# Patient Record
Sex: Male | Born: 1960
Health system: Southern US, Community
[De-identification: ages and names within clinical notes are randomized; demographics above are authoritative.]

## PROBLEM LIST (undated history)

## (undated) DIAGNOSIS — E785 Hyperlipidemia, unspecified: Secondary | ICD-10-CM

## (undated) DIAGNOSIS — K429 Umbilical hernia without obstruction or gangrene: Secondary | ICD-10-CM

## (undated) DIAGNOSIS — K649 Unspecified hemorrhoids: Secondary | ICD-10-CM

## (undated) DIAGNOSIS — T7840XA Allergy, unspecified, initial encounter: Secondary | ICD-10-CM

## (undated) DIAGNOSIS — M1990 Unspecified osteoarthritis, unspecified site: Secondary | ICD-10-CM

## (undated) HISTORY — DX: Unspecified hemorrhoids: K64.9

## (undated) HISTORY — DX: Allergy, unspecified, initial encounter: T78.40XA

## (undated) HISTORY — PX: COLONOSCOPY: SHX174

## (undated) HISTORY — DX: Umbilical hernia without obstruction or gangrene: K42.9

## (undated) HISTORY — DX: Unspecified osteoarthritis, unspecified site: M19.90

## (undated) HISTORY — PX: OTHER SURGICAL HISTORY: SHX169

## (undated) HISTORY — PX: APPENDECTOMY: SHX54

## (undated) HISTORY — DX: Hyperlipidemia, unspecified: E78.5

---

## 2004-04-10 ENCOUNTER — Encounter: Payer: Self-pay | Admitting: Internal Medicine

## 2004-06-30 ENCOUNTER — Ambulatory Visit: Payer: Self-pay | Admitting: Internal Medicine

## 2004-07-22 ENCOUNTER — Ambulatory Visit: Payer: Self-pay | Admitting: Internal Medicine

## 2004-08-27 ENCOUNTER — Ambulatory Visit: Payer: Self-pay | Admitting: Internal Medicine

## 2004-10-09 ENCOUNTER — Ambulatory Visit: Payer: Self-pay | Admitting: Internal Medicine

## 2004-10-16 ENCOUNTER — Ambulatory Visit: Payer: Self-pay | Admitting: Internal Medicine

## 2004-12-05 ENCOUNTER — Ambulatory Visit: Payer: Self-pay | Admitting: Internal Medicine

## 2005-01-16 ENCOUNTER — Ambulatory Visit: Payer: Self-pay | Admitting: Internal Medicine

## 2005-02-04 ENCOUNTER — Ambulatory Visit: Payer: Self-pay | Admitting: Internal Medicine

## 2005-03-06 ENCOUNTER — Ambulatory Visit: Payer: Self-pay | Admitting: Internal Medicine

## 2005-08-07 ENCOUNTER — Ambulatory Visit: Payer: Self-pay | Admitting: Internal Medicine

## 2005-08-14 ENCOUNTER — Ambulatory Visit: Payer: Self-pay | Admitting: Internal Medicine

## 2005-11-04 ENCOUNTER — Ambulatory Visit: Payer: Self-pay | Admitting: Internal Medicine

## 2005-12-03 ENCOUNTER — Ambulatory Visit: Payer: Self-pay | Admitting: Internal Medicine

## 2006-02-26 ENCOUNTER — Encounter: Admission: RE | Admit: 2006-02-26 | Discharge: 2006-02-26 | Payer: Self-pay | Admitting: Dermatology

## 2006-03-04 ENCOUNTER — Ambulatory Visit: Payer: Self-pay | Admitting: Internal Medicine

## 2006-03-11 ENCOUNTER — Ambulatory Visit: Payer: Self-pay | Admitting: Internal Medicine

## 2006-05-01 ENCOUNTER — Encounter: Admission: RE | Admit: 2006-05-01 | Discharge: 2006-05-01 | Payer: Self-pay | Admitting: Family Medicine

## 2006-07-28 ENCOUNTER — Ambulatory Visit: Payer: Self-pay | Admitting: Internal Medicine

## 2006-08-05 ENCOUNTER — Ambulatory Visit: Payer: Self-pay | Admitting: Gastroenterology

## 2006-08-26 ENCOUNTER — Ambulatory Visit: Payer: Self-pay | Admitting: Gastroenterology

## 2007-05-30 ENCOUNTER — Ambulatory Visit: Payer: Self-pay | Admitting: Internal Medicine

## 2007-05-30 DIAGNOSIS — A6000 Herpesviral infection of urogenital system, unspecified: Secondary | ICD-10-CM | POA: Insufficient documentation

## 2007-05-30 DIAGNOSIS — L52 Erythema nodosum: Secondary | ICD-10-CM | POA: Insufficient documentation

## 2007-05-30 DIAGNOSIS — J45909 Unspecified asthma, uncomplicated: Secondary | ICD-10-CM | POA: Insufficient documentation

## 2007-05-30 DIAGNOSIS — E781 Pure hyperglyceridemia: Secondary | ICD-10-CM | POA: Insufficient documentation

## 2007-05-30 DIAGNOSIS — J309 Allergic rhinitis, unspecified: Secondary | ICD-10-CM | POA: Insufficient documentation

## 2007-05-30 LAB — CONVERTED CEMR LAB
Direct LDL: 64.9 mg/dL
Total CHOL/HDL Ratio: 5.3
Triglycerides: 358 mg/dL (ref 0–149)
VLDL: 72 mg/dL — ABNORMAL HIGH (ref 0–40)

## 2007-07-29 IMAGING — US US ABDOMEN COMPLETE
1 series · 13 of 25 positions shown · non-contrast
Comparison: none

ACCESSION NUMBER:  [id number]

 READING PHYSICIAN:  Okamoto, Eliza.
 PROCEDURE:  Multiplanar abdominal ultrasound imaging was performed in the upright, supine, right and left lateral decubitus positions.
 RESULTS:  Abdominal aorta normal diameter at 1.8 cm.  The IVC is patent. 
 The pancreas appears normal throughout the head, body and tail without evidence of ductal dilatation, pancreatic masses, or peripancreatic inflammation.  
 Gallbladder is well distended, thin walled, with no pericholecystic fluid or intraluminal echogenic foci to suggest gallstone disease.  Wall thickness 2.9 mm. 
 The common bile duct measures 3.1 mm in maximal diameter without evidence of intraluminal foci.
 The liver appears normal without evidence of parenchymal lesion, ductal dilatation or vascular abnormality.
 Kidneys are normal in appearance.  Right 8.3, left 11.1 cm.  There is a 26 x 21 x 23 mm cyst in the lower pole of the left kidney. 
 Spleen is normal in size, measuring 11.7 cm without parenchymal lesion.

[Series 1: us abdomen complete · 0.25mm/px · 13 of 53 slices shown]
[im 1/53]
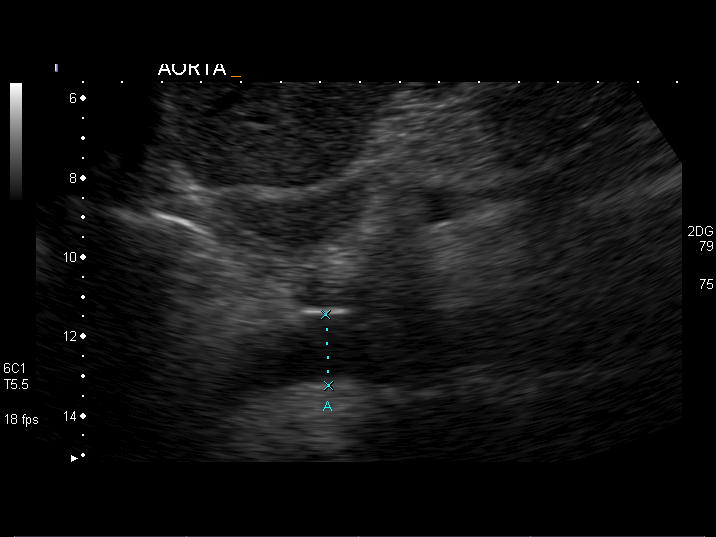
[im 5/53]
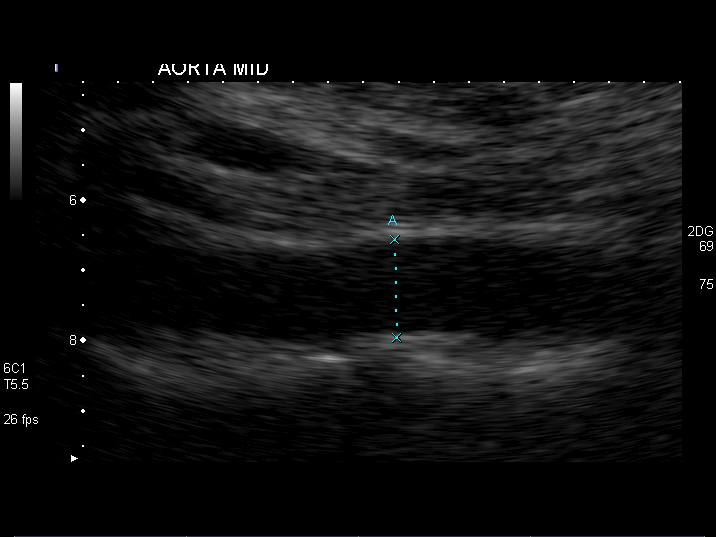
[im 9/53]
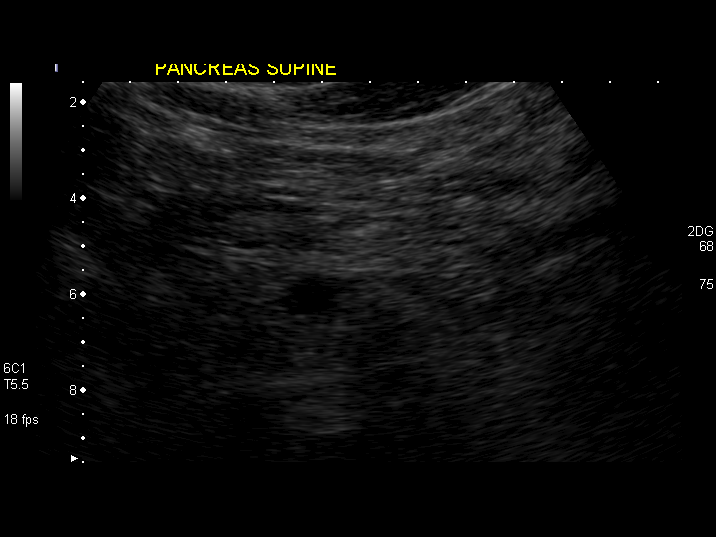
[im 14/53]
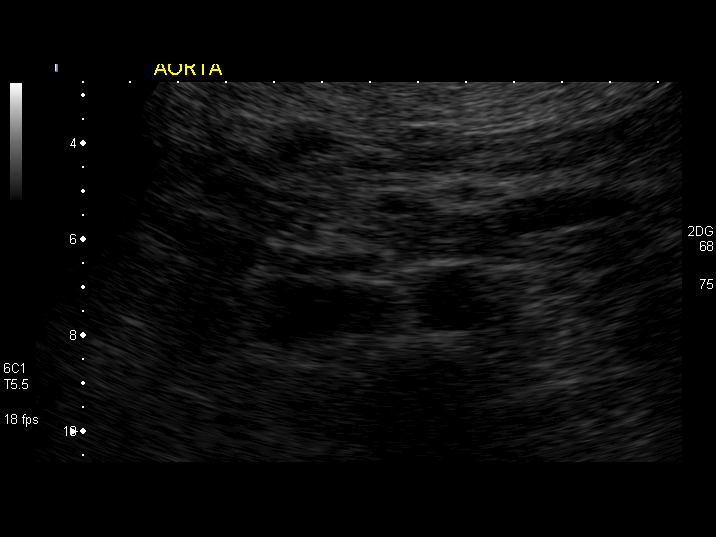
[im 18/53]
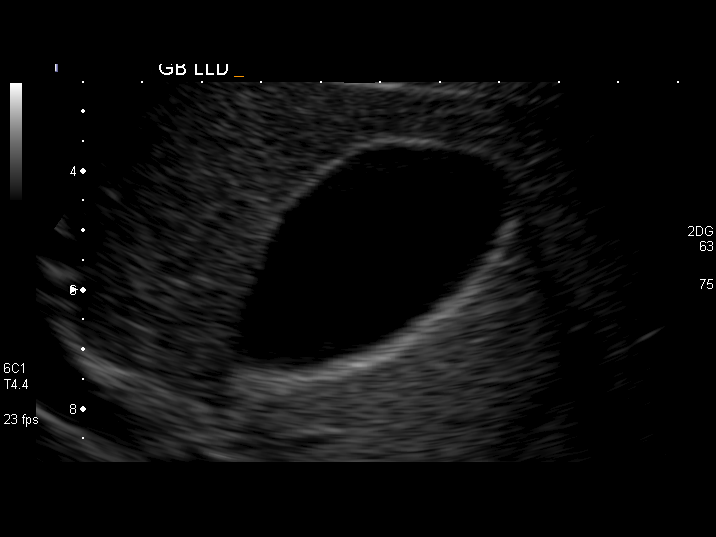
[im 22/53]
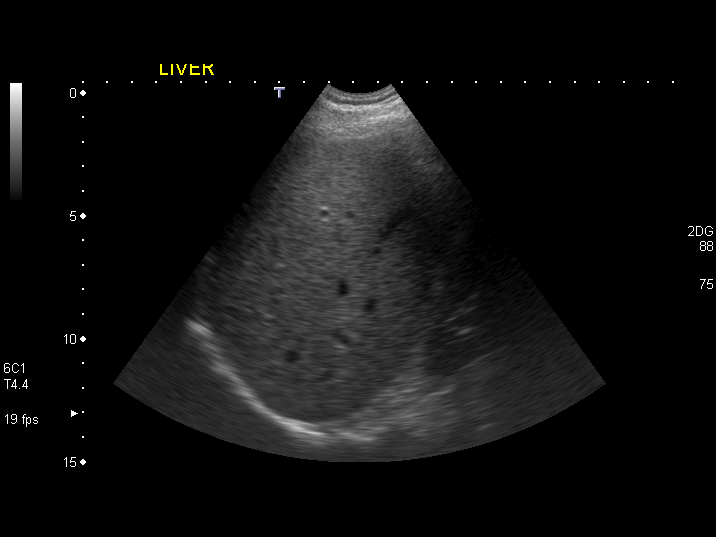
[im 27/53]
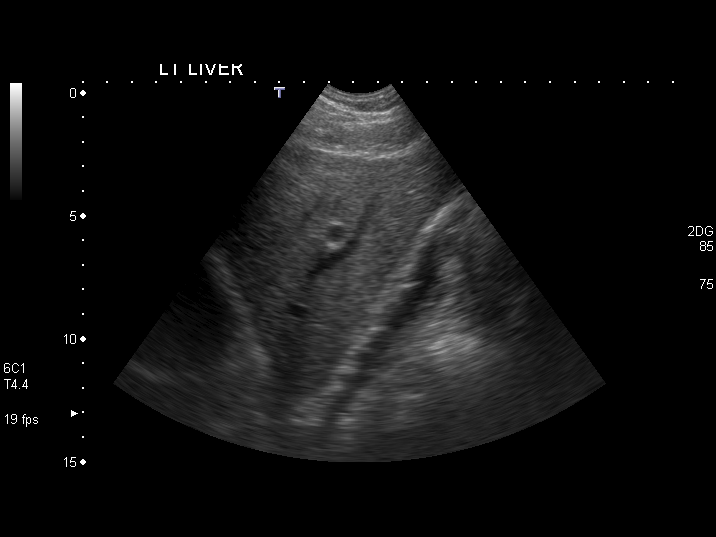
[im 31/53]
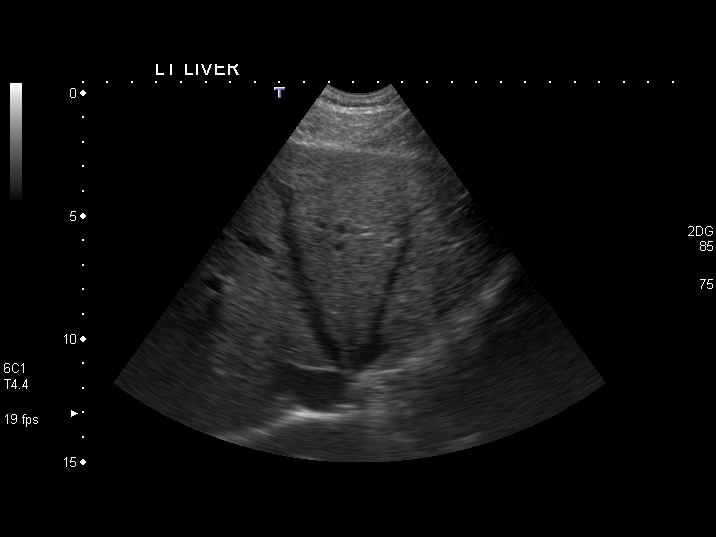
[im 35/53]
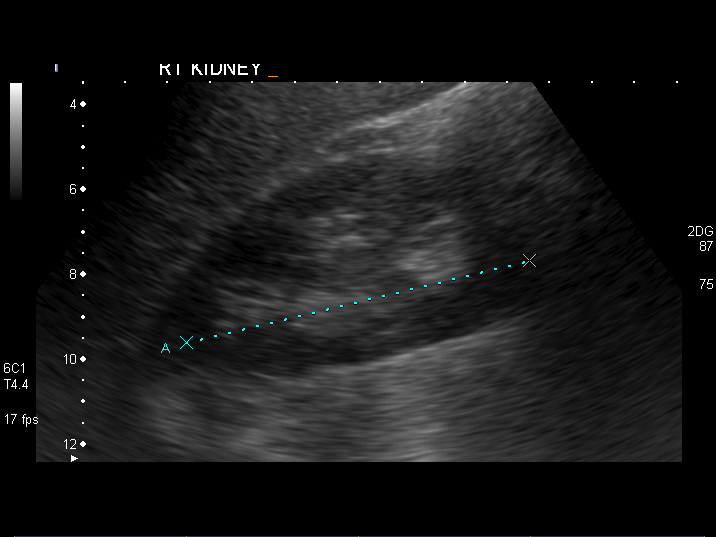
[im 40/53]
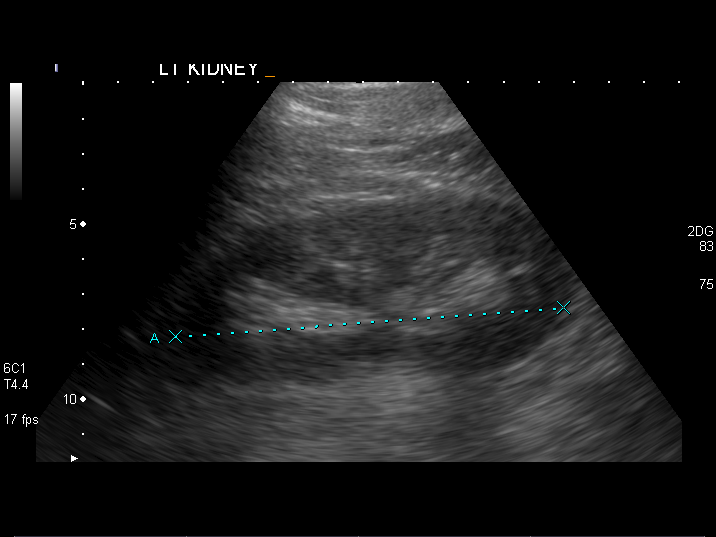
[im 44/53]
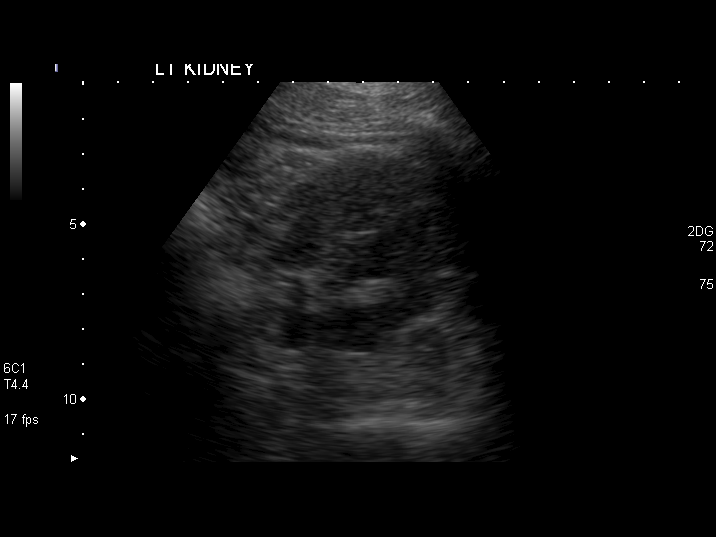
[im 48/53]
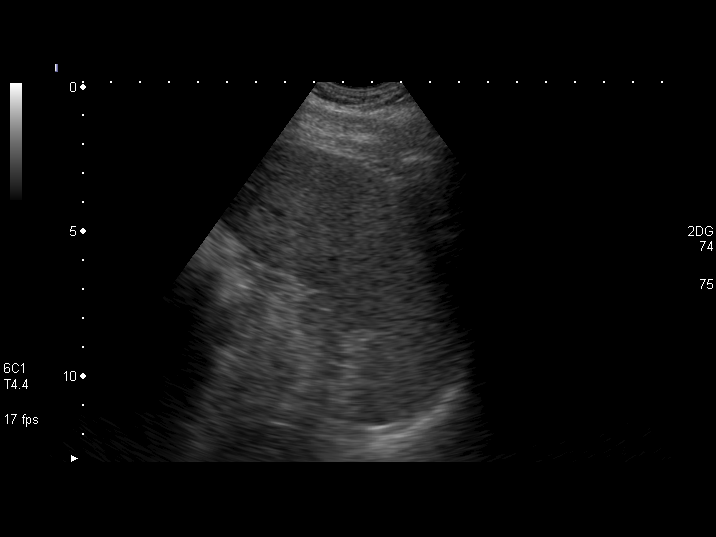
[im 53/53]
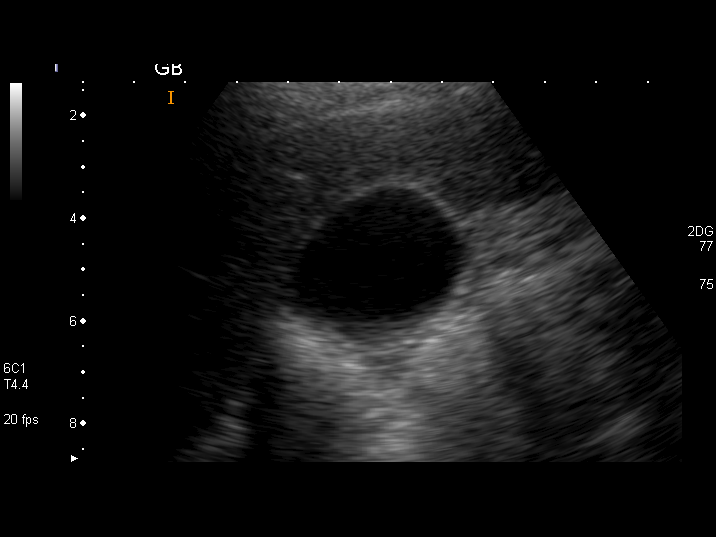

[13 of 25 positions shown; findings below may reference images not displayed]

IMPRESSION: This ultrasound exam shows no evidence of cholelithiasis or biliary ductal dilatation. The pancreas and liver are well imaged and appeared normal. There is an incidental simple left renal cyst noted.

## 2007-08-17 ENCOUNTER — Encounter: Admission: RE | Admit: 2007-08-17 | Discharge: 2007-08-17 | Payer: Self-pay | Admitting: Family Medicine

## 2008-04-16 ENCOUNTER — Ambulatory Visit: Payer: Self-pay | Admitting: Internal Medicine

## 2008-04-16 DIAGNOSIS — M159 Polyosteoarthritis, unspecified: Secondary | ICD-10-CM | POA: Insufficient documentation

## 2008-04-16 LAB — CONVERTED CEMR LAB: Uric Acid, Serum: 3.7 mg/dL — ABNORMAL LOW (ref 4.0–7.8)

## 2008-05-03 ENCOUNTER — Telehealth: Payer: Self-pay | Admitting: Internal Medicine

## 2008-07-13 LAB — FECAL OCCULT BLOOD, GUAIAC: Fecal Occult Blood: NEGATIVE

## 2008-08-17 ENCOUNTER — Ambulatory Visit: Payer: Self-pay | Admitting: Internal Medicine

## 2008-08-17 LAB — CONVERTED CEMR LAB
AST: 28 units/L (ref 0–37)
Albumin: 4.2 g/dL (ref 3.5–5.2)
Alkaline Phosphatase: 60 units/L (ref 39–117)
BUN: 11 mg/dL (ref 6–23)
Basophils Relative: 0.3 % (ref 0.0–3.0)
Chloride: 104 meq/L (ref 96–112)
Creatinine, Ser: 1 mg/dL (ref 0.4–1.5)
Direct LDL: 80.1 mg/dL
Eosinophils Absolute: 0.1 10*3/uL (ref 0.0–0.7)
Eosinophils Relative: 2.6 % (ref 0.0–5.0)
GFR calc non Af Amer: 86 mL/min
Glucose, Bld: 82 mg/dL (ref 70–99)
HCT: 43.8 % (ref 39.0–52.0)
MCV: 90.5 fL (ref 78.0–100.0)
Monocytes Absolute: 0.5 10*3/uL (ref 0.1–1.0)
Monocytes Relative: 10.3 % (ref 3.0–12.0)
Neutrophils Relative %: 64.5 % (ref 43.0–77.0)
Nitrite: NEGATIVE
RBC: 4.84 M/uL (ref 4.22–5.81)
Specific Gravity, Urine: 1.015
Total CHOL/HDL Ratio: 5.9
Total Protein: 6.5 g/dL (ref 6.0–8.3)
WBC Urine, dipstick: NEGATIVE
WBC: 4.4 10*3/uL — ABNORMAL LOW (ref 4.5–10.5)

## 2008-08-28 ENCOUNTER — Ambulatory Visit: Payer: Self-pay | Admitting: Internal Medicine

## 2008-11-20 ENCOUNTER — Ambulatory Visit: Payer: Self-pay | Admitting: Internal Medicine

## 2008-11-20 LAB — CONVERTED CEMR LAB
ALT: 49 units/L (ref 0–53)
AST: 32 units/L (ref 0–37)
BUN: 12 mg/dL (ref 6–23)
Basophils Relative: 0.4 % (ref 0.0–3.0)
Bilirubin Urine: NEGATIVE
Bilirubin, Direct: 0.1 mg/dL (ref 0.0–0.3)
Chloride: 107 meq/L (ref 96–112)
Eosinophils Relative: 3.3 % (ref 0.0–5.0)
GFR calc non Af Amer: 69.27 mL/min (ref >60)
HCT: 43.7 % (ref 39.0–52.0)
LDL Cholesterol: 108 mg/dL — ABNORMAL HIGH (ref 0–99)
Lymphs Abs: 1 10*3/uL (ref 0.7–4.0)
MCV: 91.2 fL (ref 78.0–100.0)
Monocytes Absolute: 0.4 10*3/uL (ref 0.1–1.0)
Monocytes Relative: 10.5 % (ref 3.0–12.0)
Nitrite: NEGATIVE
Platelets: 166 10*3/uL (ref 150.0–400.0)
Potassium: 4.1 meq/L (ref 3.5–5.1)
Protein, U semiquant: NEGATIVE
RBC: 4.79 M/uL (ref 4.22–5.81)
Sodium: 142 meq/L (ref 135–145)
TSH: 1.85 microintl units/mL (ref 0.35–5.50)
Total Bilirubin: 1 mg/dL (ref 0.3–1.2)
Total CHOL/HDL Ratio: 5
Total Protein: 6.4 g/dL (ref 6.0–8.3)
Urobilinogen, UA: 0.2
VLDL: 29 mg/dL (ref 0.0–40.0)
WBC Urine, dipstick: NEGATIVE
WBC: 3.7 10*3/uL — ABNORMAL LOW (ref 4.5–10.5)

## 2008-11-27 ENCOUNTER — Ambulatory Visit: Payer: Self-pay | Admitting: Internal Medicine

## 2009-04-17 ENCOUNTER — Ambulatory Visit: Payer: Self-pay | Admitting: Internal Medicine

## 2009-04-17 LAB — CONVERTED CEMR LAB
AST: 25 units/L (ref 0–37)
Albumin: 4.1 g/dL (ref 3.5–5.2)
Alkaline Phosphatase: 62 units/L (ref 39–117)
Cholesterol: 168 mg/dL (ref 0–200)
Total Protein: 6.4 g/dL (ref 6.0–8.3)

## 2009-04-24 ENCOUNTER — Ambulatory Visit: Payer: Self-pay | Admitting: Internal Medicine

## 2009-04-24 DIAGNOSIS — N529 Male erectile dysfunction, unspecified: Secondary | ICD-10-CM | POA: Insufficient documentation

## 2009-04-29 LAB — CONVERTED CEMR LAB: Testosterone: 298.91 ng/dL — ABNORMAL LOW (ref 350.00–890.00)

## 2009-04-30 ENCOUNTER — Telehealth (INDEPENDENT_AMBULATORY_CARE_PROVIDER_SITE_OTHER): Payer: Self-pay | Admitting: *Deleted

## 2009-05-07 ENCOUNTER — Telehealth: Payer: Self-pay | Admitting: Internal Medicine

## 2009-05-07 ENCOUNTER — Ambulatory Visit: Payer: Self-pay | Admitting: Internal Medicine

## 2009-06-20 ENCOUNTER — Ambulatory Visit: Payer: Self-pay | Admitting: Internal Medicine

## 2009-06-20 LAB — CONVERTED CEMR LAB
Albumin: 4.2 g/dL (ref 3.5–5.2)
Cholesterol: 153 mg/dL (ref 0–200)
Direct LDL: 78.4 mg/dL
HDL: 31.8 mg/dL — ABNORMAL LOW (ref >39.00)
Testosterone: 357.95 ng/dL (ref 350.00–890.00)
Total CHOL/HDL Ratio: 5
Total Protein: 6.1 g/dL (ref 6.0–8.3)
VLDL: 49.8 mg/dL — ABNORMAL HIGH (ref 0.0–40.0)

## 2009-06-25 ENCOUNTER — Ambulatory Visit: Payer: Self-pay | Admitting: Internal Medicine

## 2009-07-16 ENCOUNTER — Ambulatory Visit: Payer: Self-pay | Admitting: Internal Medicine

## 2009-07-29 DIAGNOSIS — E291 Testicular hypofunction: Secondary | ICD-10-CM | POA: Insufficient documentation

## 2009-07-30 ENCOUNTER — Ambulatory Visit: Payer: Self-pay | Admitting: Internal Medicine

## 2009-08-15 ENCOUNTER — Ambulatory Visit: Payer: Self-pay | Admitting: Internal Medicine

## 2009-08-15 ENCOUNTER — Telehealth: Payer: Self-pay | Admitting: Internal Medicine

## 2009-08-30 ENCOUNTER — Ambulatory Visit: Payer: Self-pay | Admitting: Internal Medicine

## 2009-09-19 ENCOUNTER — Ambulatory Visit: Payer: Self-pay | Admitting: Internal Medicine

## 2009-09-19 LAB — CONVERTED CEMR LAB: Testosterone: 157.96 ng/dL — ABNORMAL LOW (ref 350.00–890.00)

## 2009-09-30 ENCOUNTER — Ambulatory Visit: Payer: Self-pay | Admitting: Internal Medicine

## 2009-11-27 ENCOUNTER — Telehealth: Payer: Self-pay | Admitting: Internal Medicine

## 2009-11-29 ENCOUNTER — Ambulatory Visit: Payer: Self-pay | Admitting: Internal Medicine

## 2009-11-29 DIAGNOSIS — M549 Dorsalgia, unspecified: Secondary | ICD-10-CM | POA: Insufficient documentation

## 2009-12-09 ENCOUNTER — Ambulatory Visit: Payer: Self-pay | Admitting: Internal Medicine

## 2009-12-16 ENCOUNTER — Ambulatory Visit: Payer: Self-pay | Admitting: Internal Medicine

## 2009-12-16 DIAGNOSIS — M67911 Unspecified disorder of synovium and tendon, right shoulder: Secondary | ICD-10-CM | POA: Insufficient documentation

## 2010-02-11 ENCOUNTER — Ambulatory Visit: Payer: Self-pay | Admitting: Internal Medicine

## 2010-02-11 LAB — CONVERTED CEMR LAB
Bilirubin, Direct: 0.2 mg/dL (ref 0.0–0.3)
Direct LDL: 59.5 mg/dL
HDL: 34.3 mg/dL — ABNORMAL LOW (ref >39.00)
Total Bilirubin: 1.8 mg/dL — ABNORMAL HIGH (ref 0.3–1.2)
VLDL: 79 mg/dL — ABNORMAL HIGH (ref 0.0–40.0)

## 2010-02-17 ENCOUNTER — Ambulatory Visit: Payer: Self-pay | Admitting: Internal Medicine

## 2010-03-12 ENCOUNTER — Encounter: Payer: Self-pay | Admitting: Internal Medicine

## 2010-03-19 ENCOUNTER — Encounter: Payer: Self-pay | Admitting: Internal Medicine

## 2010-04-14 ENCOUNTER — Ambulatory Visit: Payer: Self-pay | Admitting: Internal Medicine

## 2010-04-14 LAB — CONVERTED CEMR LAB
Cholesterol: 168 mg/dL (ref 0–200)
HDL: 31.7 mg/dL — ABNORMAL LOW (ref >39.00)
VLDL: 73 mg/dL — ABNORMAL HIGH (ref 0.0–40.0)

## 2010-04-16 ENCOUNTER — Encounter: Payer: Self-pay | Admitting: Internal Medicine

## 2010-04-21 ENCOUNTER — Ambulatory Visit: Payer: Self-pay | Admitting: Internal Medicine

## 2010-05-28 ENCOUNTER — Telehealth: Payer: Self-pay | Admitting: Internal Medicine

## 2010-06-09 ENCOUNTER — Ambulatory Visit: Payer: Self-pay | Admitting: Internal Medicine

## 2010-06-16 ENCOUNTER — Ambulatory Visit: Payer: Self-pay | Admitting: Internal Medicine

## 2010-08-11 ENCOUNTER — Other Ambulatory Visit: Payer: Self-pay | Admitting: Internal Medicine

## 2010-08-11 ENCOUNTER — Ambulatory Visit
Admission: RE | Admit: 2010-08-11 | Discharge: 2010-08-11 | Payer: Self-pay | Source: Home / Self Care | Attending: Internal Medicine | Admitting: Internal Medicine

## 2010-08-11 LAB — HEPATIC FUNCTION PANEL
ALT: 33 U/L (ref 0–53)
AST: 22 U/L (ref 0–37)
Albumin: 4.2 g/dL (ref 3.5–5.2)
Alkaline Phosphatase: 61 U/L (ref 39–117)
Bilirubin, Direct: 0.2 mg/dL (ref 0.0–0.3)
Total Bilirubin: 2 mg/dL — ABNORMAL HIGH (ref 0.3–1.2)
Total Protein: 6.5 g/dL (ref 6.0–8.3)

## 2010-08-11 LAB — LIPID PANEL
Cholesterol: 161 mg/dL (ref 0–200)
HDL: 33 mg/dL — ABNORMAL LOW (ref >39.00)
Total CHOL/HDL Ratio: 5
Triglycerides: 256 mg/dL — ABNORMAL HIGH (ref 0.0–149.0)
VLDL: 51.2 mg/dL — ABNORMAL HIGH (ref 0.0–40.0)

## 2010-08-11 LAB — LDL CHOLESTEROL, DIRECT: Direct LDL: 87.4 mg/dL

## 2010-08-11 LAB — TESTOSTERONE: Testosterone: 539.16 ng/dL (ref 350.00–890.00)

## 2010-08-19 ENCOUNTER — Ambulatory Visit
Admission: RE | Admit: 2010-08-19 | Discharge: 2010-08-19 | Payer: Self-pay | Source: Home / Self Care | Attending: Internal Medicine | Admitting: Internal Medicine

## 2010-08-19 DIAGNOSIS — Z8719 Personal history of other diseases of the digestive system: Secondary | ICD-10-CM | POA: Insufficient documentation

## 2010-08-19 DIAGNOSIS — Z9889 Other specified postprocedural states: Secondary | ICD-10-CM

## 2010-09-02 NOTE — Progress Notes (Signed)
Summary: doc Clova Morlock please call  Phone Note Call from Patient Call back at Home Phone [phone]   Caller: Patient Call For: Stacie Glaze MD Reason for Call: Talk to Nurse Summary of Call: Pt would like dr Lovell Sheehan to call him personally concerning testosterone inj Initial call taken by: Heron Sabins,  August 15, 2009 10:36 AM  Follow-up for Phone Call        OV GIVEN Follow-up by: Willy Eddy, LPN,  August 15, 2009 11:08 AM

## 2010-09-02 NOTE — Assessment & Plan Note (Signed)
Summary: WANTS CORTISONE SHOT//CCM   Vital Signs:  Patient profile:   50 year old male Height:      71 inches Weight:      176 pounds BMI:     24.64 Temp:     98.2 degrees F oral Pulse rate:   64 / minute Resp:     14 per minute BP sitting:   110 / 70  (left arm)  Vitals Entered By: Willy Eddy, LPN (November 29, 2009 12:08 PM) CC: c/o rt s [place] pain-  no xray   CC:  c/o rt s [place] pain-  no xray.  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 Once Daily At Times 2)  Multivitamins  Caps (Multiple Vitamin) .Marland Kitchen.. 1 Once Daily 3)  Krill Oil 1000 Mg Caps (Krill Oil) .... Two  Two Times A Day 4)  Livalo 4 Mg Tabs (Pitavastatin Calcium) .... One By Mouth Weekly On Sunday 5)  Testosterone Cypionate 200 Mg/ml Oil (Testosterone Cypionate) .Marland Kitchen.. 1ml  Every Month 6)  Valacyclovir Hcl 500 Mg Tabs (Valacyclovir Hcl) .... One By Mouth Three Times A Day As Directed  Allergies (verified): No Known Drug Allergies  Physical Exam  Msk:  tender ober the insertion of the erectus longus Pulses:  R and L carotid,radial,femoral,dorsalis pedis and posterior tibial pulses are full and equal bilaterally Extremities:  No clubbing, cyanosis, edema, or deformity noted with normal full range of motion of all joints.   Neurologic:  No cranial nerve deficits  noted. Station and gait are normal. Plantar reflexes are down-going bilaterally. DTRs are symmetrical throughout. Sensory, motor and coordinative functions appear intact.   Impression & Recommendations:  Problem # 1:  BACK PAIN (ICD-724.5)  point injection in the mid back His updated medication list for this problem includes:    Bayer Aspirin 325 Mg Tabs (Aspirin) ..... Once daily at times  Discussed use of moist heat or ice, modified activities, medications, and stretching/strengthening exercises. Back care instructions given. To be seen in 2 weeks if no improvement; sooner if worsening of symptoms.   Orders: No Charge Patient Arrived (NCPA0) (NCPA0) Trigger Point Injection (1 or 2 muscles) (81191) Depo- Medrol 40mg  (J1030)  Complete Medication List: 1)  Bayer Aspirin 325 Mg Tabs (Aspirin) .... Once daily at times 2)  Multivitamins Caps (Multiple vitamin) .Marland Kitchen.. 1 once daily 3)  Krill Oil 1000 Mg Caps (Krill oil) .... Two  two times a day 4)  Livalo 4 Mg Tabs (Pitavastatin calcium) .... One by mouth weekly on sunday 5)  Testosterone Cypionate 200 Mg/ml Oil (Testosterone cypionate) .Marland Kitchen.. 1ml  every month 6)  Valacyclovir Hcl 500 Mg Tabs (Valacyclovir hcl) .... One by mouth three times a day as directed

## 2010-09-02 NOTE — Assessment & Plan Note (Signed)
Summary: 2 month rov/njr   Vital Signs:  Patient profile:   50 year old male Height:      71 inches Weight:      178 pounds BMI:     24.92 Temp:     98.2 degrees F oral Pulse rate:   60 / minute Resp:     14 per minute BP sitting:   110 / 70  (left arm)  Vitals Entered By: Willy Eddy, LPN (February 17, 2010 11:50 AM) CC: roas labs   CC:  roas labs.  History of Present Illness: monitering of testosterone therapy for hypogonadism has been on multiple dosing scheduled last has been weekly shots has hx of hyperlipidemia new complain of subacute injury to his foot with pain and swelling on the dorsum of the foot below the maleolus.  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) 9)  Herpes, Genital Nos  (ICD-054.10) 10)  Allergic Rhinitis  (ICD-477.9) 11)  Hyperlipidemia  (ICD-272.4) 12)  Asthma  (ICD-493.90)  Current Problems (verified): 1)  Shoulder Impingement Syndrome, Right  (ICD-726.2) 2)  Back Pain  (ICD-724.5) 3)  Testicular Hypofunction  (ICD-257.2) 4)  Erectile Dysfunction, Organic  (ICD-607.84) 5)  Preventive Health Care  (ICD-V70.0) 6)  Gouty Arthropathy  (ICD-274.0) 7)  Arthritis, Right Foot  (ICD-716.97) 8)  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 Once Daily At Times 2)  Multivitamins  Caps (Multiple Vitamin) .Marland Kitchen.. 1 Once Daily 3)  Krill Oil 1000 Mg Caps (Krill Oil)  .... Two  Two Times A Day 4)  Livalo 4 Mg Tabs (Pitavastatin Calcium) .... One By Mouth Weekly On Sunday 5)  Testosterone Cypionate 200 Mg/ml Oil (Testosterone Cypionate) .... .4 Ml Every Week 6)  Valacyclovir Hcl 500 Mg Tabs (Valacyclovir Hcl) .... One By Mouth Three Times A Day As Directed 7)  Fluconazole 100 Mg Tabs (Fluconazole) .... One By Mouth Daily For 10  Current Medications (verified): 1)  Bayer Aspirin 325 Mg  Tabs (Aspirin) .... Once Daily At Times 2)  Multivitamins  Caps (Multiple Vitamin) .Marland Kitchen.. 1 Once Daily 3)  Krill Oil 1000 Mg Caps (Krill Oil) .... Two  Two Times A Day 4)  Livalo 4 Mg Tabs (Pitavastatin Calcium) .... One By Mouth Weekly On Sunday 5)  Testosterone Cypionate 200 Mg/ml Oil (Testosterone Cypionate) .... .5 Ml Every Week 6)  Valacyclovir Hcl 500 Mg Tabs (Valacyclovir Hcl) .... One By Mouth Three Times A Day As Directed 7)  Fluconazole 100 Mg Tabs (Fluconazole) .... One By Mouth Daily For 10  Allergies (verified): No Known Drug Allergies  Past History:  Family History: Last updated: 2008-09-06 father died of brain tumor at 73, uncle died with "bone cancer" two aunts with breast cancer mother Family History Hypertension  Social History: Last updated: 05/30/2007 Single Never Smoked Alcohol use-no Drug use-no Regular exercise-yes Domestic Partner  Risk Factors: Exercise: yes (05/30/2007)  Risk Factors: Smoking Status: never (02/17/2010)  Past medical, surgical, family and social histories (including risk factors) reviewed, and no changes noted (except as noted below).  Past Medical History: Reviewed history from 05/30/2007 and no changes required. Asthma Hyperlipidemia Allergic rhinitis  Family History: Reviewed history from 08/28/2008 and no changes required. father died of brain tumor at 8, uncle died with "bone cancer" two aunts with breast cancer mother Family History Hypertension  Social History: Reviewed history from 05/30/2007  and no changes required. Single Never Smoked Alcohol use-no Drug use-no Regular exercise-yes Domestic Partner  Review of Systems  The patient denies anorexia, fever, weight loss, weight gain, vision loss, decreased hearing, hoarseness, chest pain, syncope, dyspnea on exertion, peripheral edema, prolonged cough, headaches, hemoptysis, abdominal pain, melena, hematochezia, severe indigestion/heartburn, hematuria, incontinence, genital sores, muscle weakness, suspicious skin lesions, transient blindness, difficulty walking, depression, unusual weight change, abnormal bleeding, enlarged lymph nodes, angioedema, breast masses, and testicular masses.    Physical Exam  General:  Well-developed,well-nourished,in no acute distress; alert,appropriate and cooperative throughout examination Head:  normocephalic and male-pattern balding.   Eyes:  pupils equal and pupils reactive to light.   Ears:  R ear normal and L ear normal.   Nose:  External nasal examination shows no deformity or inflammation. Nasal mucosa are pink and moist without lesions or exudates. Neck:  No deformities, masses, or tenderness noted. Lungs:  Normal respiratory effort, chest expands symmetrically. Lungs are clear to auscultation, no crackles or wheezes. Heart:  Normal rate and regular rhythm. S1 and S2 normal without gallop, murmur, click, rub or other extra sounds. Abdomen:  Bowel sounds positive,abdomen soft and non-tender without masses, organomegaly or hernias noted. Msk:  no joint warmth, no redness over joints, and joint tenderness.   Pulses:  R and L carotid,radial,femoral,dorsalis pedis and posterior tibial pulses are full and equal bilaterally Extremities:  trace right pedal edema.   Neurologic:  alert & oriented X3 and finger-to-nose normal.     Impression & Recommendations:  Problem # 1:  ARTHRITIS, RIGHT FOOT (ICD-716.97) did not refer due to controlled pain Elevate extremity; warm compresses, symptomatic  relief and medication as directed.   Problem # 2:  HYPERLIPIDEMIA (ICD-272.4)  His updated medication list for this problem includes:    Livalo 4 Mg Tabs (Pitavastatin calcium) ..... One by mouth weekly on sunday  Labs Reviewed: SGOT: 25 (02/11/2010)   SGPT: 35 (02/11/2010)  Lipid Goals: Chol Goal: 200 (05/30/2007)   HDL Goal: 40 (05/30/2007)   LDL Goal: 160 (05/30/2007)   TG Goal: 150 (05/30/2007)  Prior 10 Yr Risk Heart Disease: Not enough information (05/30/2007)   HDL:34.30 (02/11/2010), 31.80 (06/20/2009)  LDL:108 (11/20/2008), DEL (08/17/2008)  Chol:151 (02/11/2010), 153 (06/20/2009)  Trig:395.0 (02/11/2010), 269.0 (12/09/2009)  Problem # 3:  SHOULDER IMPINGEMENT SYNDROME, RIGHT (ICD-726.2) dr Rennis Chris is seeing pt for "bursitis"  Complete Medication List: 1)  Bayer Aspirin 325 Mg Tabs (Aspirin) .... Once daily at times 2)  Multivitamins Caps (Multiple vitamin) .Marland Kitchen.. 1 once daily 3)  Krill Oil 1000 Mg Caps (Krill oil) .... Two  two times a day 4)  Livalo 4 Mg Tabs (Pitavastatin calcium) .... One by mouth weekly on sunday 5)  Testosterone Cypionate 200 Mg/ml Oil (Testosterone cypionate) .... Marland Kitchen5 ml every week 6)  Valacyclovir Hcl 500 Mg Tabs (Valacyclovir hcl) .... One by mouth three times a day as directed 7)  Fluconazole 100 Mg Tabs (Fluconazole) .... One by mouth daily for 10  Patient Instructions: 1)  Please schedule a follow-up appointment in 2 months. 2)  testosterone level 607.84

## 2010-09-02 NOTE — Assessment & Plan Note (Signed)
Summary: 11 WEEK FUP//CCM   Vital Signs:  Patient profile:   50 year old male Height:      71 inches Weight:      174 pounds BMI:     24.36 Temp:     98.2 degrees F oral Pulse rate:   68 / minute Resp:     14 per minute BP sitting:   110 / 70  (left arm)  Vitals Entered By: Willy Eddy, LPN (Dec 16, 2009 11:15 AM) CC: roa labs    CC:  roa labs .  History of Present Illness: the current injection is .3 cc  weekly he has noted increase muscle tone and sexual functioning he had labs prior to this visit for testosterone monitering  no testicular skinkage reported no chest pain edema or sob  increased shoulder pain with reduced  rom  Preventive Screening-Counseling & Management  Alcohol-Tobacco     Smoking Status: never  Problems Prior to Update: 1)  Back Pain  (ICD-724.5) 2)  Testicular Hypofunction  (ICD-257.2) 3)  Erectile Dysfunction, Organic  (ICD-607.84) 4)  Preventive Health Care  (ICD-V70.0) 5)  Gouty Arthropathy  (ICD-274.0) 6)  Arthritis, Right Foot  (ICD-716.97) 7)  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) 8)  Herpes, Genital Nos  (ICD-054.10) 9)  Allergic Rhinitis  (ICD-477.9) 10)  Hyperlipidemia  (ICD-272.4) 11)  Asthma  (ICD-493.90)  Current Problems (verified): 1)  Back Pain  (ICD-724.5) 2)  Testicular Hypofunction  (ICD-257.2) 3)  Erectile Dysfunction, Organic  (ICD-607.84) 4)  Preventive Health Care  (ICD-V70.0) 5)  Gouty Arthropathy  (ICD-274.0) 6)  Arthritis, Right Foot  (ICD-716.97) 7)  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 Once Daily At Times 2)  Multivitamins  Caps (Multiple Vitamin) .Marland Kitchen.. 1 Once Daily 3)  Krill Oil 1000 Mg Caps (Krill Oil) .... Two  Two Times A Day 4)  Livalo 4 Mg Tabs (Pitavastatin Calcium) .... One By Mouth Weekly On  Sunday 5)  Testosterone Cypionate 200 Mg/ml Oil (Testosterone Cypionate) .Marland Kitchen.. 1ml  Every Month 6)  Valacyclovir Hcl 500 Mg Tabs (Valacyclovir Hcl) .... One By Mouth Three Times A Day As Directed  Current Medications (verified): 1)  Bayer Aspirin 325 Mg  Tabs (Aspirin) .... Once Daily At Times 2)  Multivitamins  Caps (Multiple Vitamin) .Marland Kitchen.. 1 Once Daily 3)  Krill Oil 1000 Mg Caps (Krill Oil) .... Two  Two Times A Day 4)  Livalo 4 Mg Tabs (Pitavastatin Calcium) .... One By Mouth Weekly On Sunday 5)  Testosterone Cypionate 200 Mg/ml Oil (Testosterone Cypionate) .... .4 Ml Every Week 6)  Valacyclovir Hcl 500 Mg Tabs (Valacyclovir Hcl) .... One By Mouth Three Times A Day As Directed 7)  Fluconazole 100 Mg Tabs (Fluconazole) .... One By Mouth Daily For 10  Allergies (verified): No Known Drug Allergies  Past History:  Family History: Last updated: 09-23-08 father died of brain tumor at 39, uncle died with "bone cancer" two aunts with breast cancer mother Family History Hypertension  Social History: Last updated: 05/30/2007 Single Never Smoked Alcohol use-no Drug use-no Regular exercise-yes Domestic Partner  Risk Factors: Exercise: yes (05/30/2007)  Risk Factors: Smoking Status: never (12/16/2009)  Past medical, surgical, family and social histories (including risk factors) reviewed, and no changes noted (except as noted below).  Past Medical History:  Reviewed history from 05/30/2007 and no changes required. Asthma Hyperlipidemia Allergic rhinitis  Family History: Reviewed history from 08/28/2008 and no changes required. father died of brain tumor at 109, uncle died with "bone cancer" two aunts with breast cancer mother Family History Hypertension  Social History: Reviewed history from 05/30/2007 and no changes required. Single Never Smoked Alcohol use-no Drug use-no Regular exercise-yes Domestic Partner  Review of Systems  The patient denies anorexia,  fever, weight loss, weight gain, vision loss, decreased hearing, hoarseness, chest pain, syncope, dyspnea on exertion, peripheral edema, prolonged cough, headaches, hemoptysis, abdominal pain, melena, hematochezia, severe indigestion/heartburn, hematuria, incontinence, genital sores, muscle weakness, suspicious skin lesions, transient blindness, difficulty walking, depression, unusual weight change, abnormal bleeding, enlarged lymph nodes, angioedema, breast masses, and testicular masses.    Physical Exam  General:  Well-developed,well-nourished,in no acute distress; alert,appropriate and cooperative throughout examination Head:  normocephalic and male-pattern balding.   Eyes:  pupils equal and pupils reactive to light.   Mouth:  pharynx pink and moist.   Neck:  No deformities, masses, or tenderness noted. Lungs:  Normal respiratory effort, chest expands symmetrically. Lungs are clear to auscultation, no crackles or wheezes. Heart:  Normal rate and regular rhythm. S1 and S2 normal without gallop, murmur, click, rub or other extra sounds. Abdomen:  Bowel sounds positive,abdomen soft and non-tender without masses, organomegaly or hernias noted. Msk:  tender ober the insertion of the erectus longus Pulses:  R and L carotid,radial,femoral,dorsalis pedis and posterior tibial pulses are full and equal bilaterally Extremities:  No clubbing, cyanosis, edema, or deformity noted with normal full range of motion of all joints.   Neurologic:  No cranial nerve deficits noted. Station and gait are normal. Plantar reflexes are down-going bilaterally. DTRs are symmetrical throughout. Sensory, motor and coordinative functions appear intact.   Impression & Recommendations:  Problem # 1:  TESTICULAR HYPOFUNCTION (ICD-257.2) increase  .4cc weeks  Problem # 2:  SHOULDER IMPINGEMENT SYNDROME, RIGHT (ICD-726.2)  referral to supple  Orders: Orthopedic Surgeon Referral (Ortho Surgeon)  Complete Medication  List: 1)  Bayer Aspirin 325 Mg Tabs (Aspirin) .... Once daily at times 2)  Multivitamins Caps (Multiple vitamin) .Marland Kitchen.. 1 once daily 3)  Krill Oil 1000 Mg Caps (Krill oil) .... Two  two times a day 4)  Livalo 4 Mg Tabs (Pitavastatin calcium) .... One by mouth weekly on sunday 5)  Testosterone Cypionate 200 Mg/ml Oil (Testosterone cypionate) .... Marland Kitchen4 ml every week 6)  Valacyclovir Hcl 500 Mg Tabs (Valacyclovir hcl) .... One by mouth three times a day as directed 7)  Fluconazole 100 Mg Tabs (Fluconazole) .... One by mouth daily for 10  Patient Instructions: 1)  will increase the dose to .4cc weekly 2)  Please schedule a follow-up appointment in 2 months. 3)  Hepatic Panel prior to visit, ICD-9:995.20 4)  Lipid Panel prior to visit, ICD-9:272.4 5)  testoterone  level 257.2 Prescriptions: FLUCONAZOLE 100 MG TABS (FLUCONAZOLE) one by mouth daily for 10  #10 x 2   Entered and Authorized by:   Stacie Glaze MD   Signed by:   Stacie Glaze MD on 12/16/2009   Method used:   Print then Give to Patient   RxID:   [id number] TESTOSTERONE CYPIONATE 200 MG/ML OIL (TESTOSTERONE CYPIONATE) .4 ml every week  #10cc x 3   Entered and Authorized by:   Stacie Glaze MD   Signed by:   Stacie Glaze MD on 12/16/2009   Method used:   Print  then Give to Patient   RxID:   [id number]   Appended Document: Orders Update    Clinical Lists Changes  Orders: Added new Referral order of Orthopedic Surgeon Referral (Ortho Surgeon) - Signed

## 2010-09-02 NOTE — Progress Notes (Signed)
Summary: Pt is needing more syringes for testosterone injs  Phone Note Call from Patient Call back at Home Phone [phone]   Caller: Patient Summary of Call: Pt called and says that he has been getting syringes from Dr. Lovell Sheehan and pt only has 1 lft. Pt says that he is using them for testosterone injs. Pt says the syringes are BD Eclipse 3ml and 25G by 5/8.  Dr Lovell Sheehan usually gives 10-12 at a time. Pt is going to come by office with the last syringe he has, to see if Dr. Lovell Sheehan could give him some more.     Initial call taken by: Lucy Antigua,  May 28, 2010 1:58 PM  Follow-up for Phone Call        call in syrynges to pharmacy 1 inch 21 g 3 cc nunmber 100 refill x 1 Follow-up by: Stacie Glaze MD,  May 29, 2010 8:51 AM    New/Updated Medications: BD ECLIPSE SYRINGE 21G X 1" 3 ML MISC (SYRINGE/NEEDLE (DISP)) Use as directed Prescriptions: BD ECLIPSE SYRINGE 21G X 1" 3 ML MISC (SYRINGE/NEEDLE (DISP)) Use as directed  #100 x 1   Entered by:   Willy Eddy, LPN   Authorized by:   Stacie Glaze MD   Signed by:   Willy Eddy, LPN on 09/81/1914   Method used:   Electronically to        [facility]  [phone]* (retail)       [address]       [place], [place]  [address]       Ph: [phone] or [phone]       Fax: [phone]   RxID:   [id number]

## 2010-09-02 NOTE — Assessment & Plan Note (Signed)
Summary: 3 MTH F/U // RS/pt rescd from bump//ccm   Vital Signs:  Patient profile:   50 year old male Height:      71 inches Weight:      176 pounds BMI:     24.64 Temp:     98.2 degrees F oral Pulse rate:   64 / minute Resp:     14 per minute BP sitting:   120 / 70  (left arm)  Vitals Entered By: Willy Eddy, LPN (September 30, 2009 12:23 PM) CC: roa labs Is Patient Diabetic? No   CC:  roa labs.  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) 7)  Herpes, Genital Nos  (ICD-054.10) 8)  Allergic Rhinitis  (ICD-477.9) 9)  Hyperlipidemia  (ICD-272.4) 10)  Asthma  (ICD-493.90)  Current Problems (verified): 1)  Testicular Hypofunction  (ICD-257.2) 2)  Erectile Dysfunction, Organic  (ICD-607.84) 3)  Preventive Health Care  (ICD-V70.0) 4)  Gouty Arthropathy  (ICD-274.0) 5)  Arthritis, Right Foot  (ICD-716.97) 6)  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 Once Daily At Times 2)  Multivitamins  Caps (Multiple Vitamin) .Marland Kitchen.. 1 Once Daily 3)  Krill Oil 1000 Mg Caps (Krill Oil) .... Two  Two Times A Day 4)  Livalo 4 Mg Tabs (Pitavastatin Calcium) .... One By Mouth Weekly On Sunday 5)  Testosterone Cypionate 200 Mg/ml Oil (Testosterone Cypionate) ....  1/2 Ml Every 2 Weeks For 3 Months and Then 1ml Every Month  Current Medications (verified): 1)  Bayer Aspirin 325 Mg  Tabs (Aspirin) .... Once Daily At Times 2)  Multivitamins  Caps (Multiple Vitamin) .Marland Kitchen.. 1 Once Daily 3)  Krill Oil 1000 Mg Caps (Krill Oil) .... Two  Two Times A Day 4)  Livalo 4 Mg Tabs (Pitavastatin Calcium) .... One By Mouth Weekly On Sunday 5)  Testosterone Cypionate 200 Mg/ml Oil (Testosterone Cypionate) .Marland Kitchen.. 1ml  Every Month 6)  Valacyclovir Hcl 500 Mg Tabs (Valacyclovir Hcl) .... One By Mouth Three Times A Day As Directed  Allergies (verified): No Known Drug Allergies  Past History:  Family History: Last updated: 2008/09/24 father died of brain tumor at 43, uncle died with "bone cancer" two aunts with breast cancer mother Family History Hypertension  Social History: Last updated: 05/30/2007 Single Never Smoked Alcohol use-no Drug use-no Regular exercise-yes Domestic Partner  Risk Factors: Exercise: yes (05/30/2007)  Risk Factors: Smoking Status: never (09/30/2009)  Past medical, surgical, family and social histories (including risk factors) reviewed, and no changes noted (except as noted below).  Past Medical History: Reviewed history from 05/30/2007 and no changes required. Asthma Hyperlipidemia Allergic rhinitis  Family History: Reviewed history from 09-24-08 and no changes required. father died of brain tumor at 63, uncle died with "bone cancer" two aunts with breast cancer mother  Family History Hypertension  Social History: Reviewed history from 05/30/2007 and no changes required. Single Never Smoked Alcohol use-no Drug use-no Regular exercise-yes Domestic Partner  Review of Systems  The patient denies anorexia, fever, weight loss, weight gain, vision loss, decreased hearing, hoarseness, chest pain, syncope, dyspnea on exertion, peripheral edema, prolonged cough, headaches, hemoptysis, abdominal pain, melena, hematochezia,  severe indigestion/heartburn, hematuria, incontinence, genital sores, muscle weakness, suspicious skin lesions, transient blindness, difficulty walking, depression, unusual weight change, abnormal bleeding, enlarged lymph nodes, angioedema, breast masses, and testicular masses.    Physical Exam  General:  Well-developed,well-nourished,in no acute distress; alert,appropriate and cooperative throughout examination Head:  normocephalic and male-pattern balding.   Eyes:  pupils equal and pupils reactive to light.   Ears:  R ear normal and L ear normal.   Neck:  No deformities, masses, or tenderness noted. Lungs:  Normal respiratory effort, chest expands symmetrically. Lungs are clear to auscultation, no crackles or wheezes. Heart:  Normal rate and regular rhythm. S1 and S2 normal without gallop, murmur, click, rub or other extra sounds.   Impression & Recommendations:  Problem # 1:  TESTICULAR HYPOFUNCTION (ICD-257.2) take .3cc every week fro 10 week  Problem # 2:  ERECTILE DYSFUNCTION, ORGANIC (ICD-607.84)  Discussed proper use of medications, as well as side effects.   Complete Medication List: 1)  Bayer Aspirin 325 Mg Tabs (Aspirin) .... Once daily at times 2)  Multivitamins Caps (Multiple vitamin) .Marland Kitchen.. 1 once daily 3)  Krill Oil 1000 Mg Caps (Krill oil) .... Two  two times a day 4)  Livalo 4 Mg Tabs (Pitavastatin calcium) .... One by mouth weekly on sunday 5)  Testosterone Cypionate 200 Mg/ml Oil (Testosterone cypionate) .... 1ml  every month 6)  Valacyclovir Hcl 500 Mg Tabs (Valacyclovir hcl) .... One by mouth three times a day as directed  Patient Instructions: 1)  Please schedule a follow-up appointment in 11weeks. 2)  10 wee3ks 3)  total testosterone and TG  257.2 Prescriptions: VALACYCLOVIR HCL 500 MG TABS (VALACYCLOVIR HCL) one by mouth three times a day as directed  #30 x 2   Entered and Authorized by:   John E Jenkins MD   Signed by:   John E Jenkins MD on 09/30/2009    Method used:   Electronically to        [facility]  [address]  [id number] (retail)       [address]       [place], [place]  [address]       Ph: [phone] or [phone]       Fax: [phone]   RxID:   [id number]

## 2010-09-02 NOTE — Progress Notes (Signed)
Summary: cortisone  Phone Note Call from Patient Call back at [phone]   Summary of Call: Hqave appt Fri for cortisone shot shoulder.  Asking if this & testosterone weekly OK to use together.  If so, I'll probably need to cancel that appt. Initial call taken by: Rudy Jew, RN,  November 27, 2009 2:43 PM  Follow-up for Phone Call        ok to take per dr Lovell Sheehan Follow-up by: Willy Eddy, LPN,  November 27, 2009 4:14 PM  Additional Follow-up for Phone Call Additional follow up Details #1::        Phone Call Completed Additional Follow-up by: Rudy Jew, RN,  November 27, 2009 4:49 PM

## 2010-09-02 NOTE — Letter (Signed)
Summary: [facility]  [facility]   Imported By: Maryln Gottron 04/23/2010 11:29:15  _____________________________________________________________________  External Attachment:    Type:   Image     Comment:   External Document

## 2010-09-02 NOTE — Assessment & Plan Note (Signed)
Summary: testosterone inj/pt req to inc dosage amount/cjr  Nurse Visit   Allergies: No Known Drug Allergies  Medication Administration  Injection # 1:    Medication: Testosterone Cypionat 200mg  ing    Diagnosis: ERECTILE DYSFUNCTION, ORGANIC (ICD-607.84)    Route: IM    Site: LUOQ gluteus    Exp Date: 02/20/2011    Lot #: [id number]    Mfr: paddock    Patient tolerated injection without complications    Given by: Willy Eddy, LPN (August 30, 2009 12:20 PM)  Orders Added: 1)  Admin of patients own med IM/SQ [phone]

## 2010-09-02 NOTE — Letter (Signed)
Summary: [facility]  [facility]   Imported By: Maryln Gottron 03/17/2010 15:29:33  _____________________________________________________________________  External Attachment:    Type:   Image     Comment:   External Document

## 2010-09-02 NOTE — Assessment & Plan Note (Signed)
Summary: 2 month rov/njr   Vital Signs:  Patient profile:   50 year old male Height:      71 inches Weight:      176 pounds Temp:     98.1 degrees F oral Pulse rate:   60 / minute Pulse rhythm:   regular BP sitting:   120 / 62  Vitals Entered By: Lynann Beaver CMA AAMA (June 16, 2010 12:39 PM) CC: discuss labs Is Patient Diabetic? No Pain Assessment Patient in pain? no        Primary Care Provider:  Stacie Glaze MD  CC:  discuss labs.  History of Present Illness: Has noted some Shrinkage of testical and when skips a dose that seems to improve has been in the upper end of normal strength and sense of well being restored but sexual functioning has not improved mood stable without outbursed improved IM injection technique  Preventive Screening-Counseling & Management  Alcohol-Tobacco     Smoking Status: never     Tobacco Counseling: not indicated; no tobacco use  Problems Prior to Update: 1)  Shoulder Impingement Syndrome, Right  (ICD-726.2) 2)  Back Pain  (ICD-724.5) 3)  Testicular Hypofunction  (ICD-257.2) 4)  Erectile Dysfunction, Organic  (ICD-607.84) 5)  Preventive Health Care  (ICD-V70.0) 6)  Arthritis, Right Foot  (ICD-716.97) 7)  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 Once Daily At Times 2)  Multivitamins  Caps (Multiple Vitamin) .Marland Kitchen.. 1 Once Daily 3)  Fish Oil 1000 Mg Caps (Omega-3 Fatty Acids) .Marland Kitchen.. 1 Once Daily 4)  Livalo 4 Mg Tabs (Pitavastatin Calcium) .... One By Mouth Weekly On Sunday 5)  Testosterone Cypionate 200 Mg/ml Oil (Testosterone Cypionate) .... .5 Ml Every Week 6)  Valacyclovir Hcl 500 Mg Tabs (Valacyclovir Hcl) .... One By Mouth Three Times A Day As Directed 7)  Nasonex 50 Mcg/act Susp (Mometasone Furoate) .... Two Spray in Nares Two  Times A Day 8)  Bd Eclipse Syringe 21g X 1" 3 Ml Misc (Syringe/needle (Disp)) .... Use As Directed  Current Medications (verified): 1)  Bayer Aspirin 325 Mg  Tabs (Aspirin) .... Once Daily At Times 2)  Multivitamins  Caps (Multiple Vitamin) .Marland Kitchen.. 1 Once Daily 3)  Fish Oil 1000 Mg Caps (Omega-3 Fatty Acids) .Marland Kitchen.. 1 Once Daily 4)  Livalo 4 Mg Tabs (Pitavastatin Calcium) .... One By Mouth Weekly On Sunday 5)  Testosterone Cypionate 200 Mg/ml Oil (Testosterone Cypionate) .... .4  Ml Every Week 6)  Valacyclovir Hcl 500 Mg Tabs (Valacyclovir Hcl) .... One By Mouth Three Times A Day As Directed 7)  Nasonex 50 Mcg/act Susp (Mometasone Furoate) .... Two Spray in Nares Two Times A Day 8)  Bd Insulin Syringe 27.5g X 5/8" 2 Ml Misc (Insulin Syringe-Needle U-100) .... To Use With Testosteron Injections  Allergies (verified): No Known Drug Allergies  Past History:  Family History: Last updated: 09-08-2008 father died of brain tumor at 92, uncle died with "bone cancer" two aunts with breast cancer mother Family History Hypertension  Social History: Last updated: 05/30/2007 Single Never Smoked Alcohol use-no Drug use-no Regular exercise-yes Domestic Partner  Risk Factors: Exercise: yes (05/30/2007)  Risk Factors: Smoking Status: never (06/16/2010)  Past medical, surgical, family and social histories (including risk factors) reviewed, and no changes noted (except as noted below).  Past Medical History: Reviewed history from 05/30/2007 and no changes required. Asthma Hyperlipidemia Allergic rhinitis  Family History: Reviewed history from 08/28/2008 and no changes required. father died of brain tumor at 48, uncle died with "bone cancer" two aunts with breast cancer mother Family History Hypertension  Social History: Reviewed history from 05/30/2007 and no changes required. Single Never Smoked Alcohol use-no Drug use-no Regular exercise-yes Domestic Partner  Review of  Systems  The patient denies anorexia, fever, weight loss, weight gain, vision loss, decreased hearing, hoarseness, chest pain, syncope, dyspnea on exertion, peripheral edema, prolonged cough, headaches, hemoptysis, abdominal pain, melena, hematochezia, severe indigestion/heartburn, hematuria, incontinence, genital sores, muscle weakness, suspicious skin lesions, transient blindness, difficulty walking, depression, unusual weight change, abnormal bleeding, enlarged lymph nodes, angioedema, breast masses, and testicular masses.    Physical Exam  General:  Well-developed,well-nourished,in no acute distress; alert,appropriate and cooperative throughout examination Head:  normocephalic and male-pattern balding.   Eyes:  pupils equal and pupils reactive to light.   Ears:  R ear normal and L ear normal.   Nose:  External nasal examination shows no deformity or inflammation. Nasal mucosa are pink and moist without lesions or exudates. Mouth:  pharynx pink and moist.   Neck:  No deformities, masses, or tenderness noted. Lungs:  Normal respiratory effort, chest expands symmetrically. Lungs are clear to auscultation, no crackles or wheezes. Heart:  Normal rate and regular rhythm. S1 and S2 normal without gallop, murmur, click, rub or other extra sounds. Abdomen:  Bowel sounds positive,abdomen soft and non-tender without masses, organomegaly or hernias noted.   Impression & Recommendations:  Problem # 1:  TESTICULAR HYPOFUNCTION (ICD-257.2) IM replacement has raised levels to high normal testicular changes are intermitant, subjective and not seen on exam GU consult did not see shrinkage  Problem # 2:  ASTHMA (ICD-493.90) stable  Problem # 3:  ERECTILE DYSFUNCTION, ORGANIC (ICD-607.84) ed has not improved giving concern to psychological issues Discussed proper use of medications, as well as side effects.   Complete Medication List: 1)  Bayer Aspirin 325 Mg Tabs (Aspirin) .... Once daily at  times 2)  Multivitamins Caps (Multiple vitamin) .Marland Kitchen.. 1 once daily 3)  Fish Oil 1000 Mg Caps (Omega-3 fatty acids) .Marland Kitchen.. 1 once daily 4)  Livalo 4 Mg Tabs (Pitavastatin calcium) .... One by mouth weekly on sunday 5)  Testosterone Cypionate 200 Mg/ml Oil (Testosterone cypionate) .... .4  ml every week 6)  Valacyclovir Hcl 500 Mg Tabs (Valacyclovir hcl) .... One by mouth three times a day as directed 7)  Nasonex 50 Mcg/act Susp (Mometasone furoate) .... Two spray in nares two times a day 8)  Bd Insulin Syringe 27.5g X 5/8" 2 Ml Misc (Insulin syringe-needle u-100) .... To use with testosteron injections  Patient Instructions: 1)  Please schedule a follow-up appointment in 2 months 2)  Testosterone leved just before shot 607.84 3)  Hepatic Panel prior to visit, ICD-9:995.20 4)  Lipid Panel prior to visit, ICD-9:272.4 Prescriptions: BD INSULIN SYRINGE 27.5G X 5/8" 2 ML MISC (INSULIN SYRINGE-NEEDLE U-100) to use with testosteron injections  #100 x 3   Entered and Authorized by:   John E Jenkins MD   Signed by:   John E Jenkins MD on 06/16/2010   Method used:   Electronically to        [facility]  [address]  [id number] (retail)       [address] [facility] [place],  [place]  [address]       Ph: [phone] or [phone]       Fax: [phone]   RxID:   [id number]    Orders Added: 1)  Est. Patient Level IV [27253]

## 2010-09-02 NOTE — Assessment & Plan Note (Signed)
Summary: TESTOSTONE INJECTION/BMW  Nurse Visit   Allergies: No Known Drug Allergies  Medication Administration  Injection # 1:    Medication: Testosterone Cypionat 200mg  ing    Diagnosis: TESTICULAR HYPOFUNCTION (ICD-257.2)    Route: IM    Site: LUOQ gluteus    Exp Date: 02/13/2012    Lot #: [id number]    Mfr: paddock    Comments: 1/2 ml given    Patient tolerated injection without complications    Given by: Willy Eddy, LPN (August 15, 2009 12:41 PM)  Orders Added: 1)  Admin of patients own med IM/SQ [phone]

## 2010-09-02 NOTE — Assessment & Plan Note (Signed)
Summary: 2 month fup//ccm   Vital Signs:  Patient profile:   50 year old male Height:      71 inches Weight:      178 pounds BMI:     24.92 Temp:     98.2 degrees F oral Pulse rate:   68 / minute Resp:     14 per minute BP sitting:   124 / 70  (left arm)  Vitals Entered By: Willy Eddy, LPN (April 21, 2010 11:23 AM) CC: roa labs, Lipid Management Is Patient Diabetic? No   CC:  roa labs and Lipid Management.  History of Present Illness: follow up for hypertriglycerides the testosterone is now in the normal range the pt  believes his diet has been controlled and he feels that he does npot eat too many carbs side effects of testosterone injectons has persistant tensis elbow seeing  Dr Rennis Chris is using the brace   Lipid Management History:      Positive NCEP/ATP III risk factors include male age 26 years old or older and HDL cholesterol less than 40.  Negative NCEP/ATP III risk factors include non-diabetic, no family history for ischemic heart disease, non-tobacco-user status, non-hypertensive, no ASHD (atherosclerotic heart disease), no prior stroke/TIA, no peripheral vascular disease, and no history of aortic aneurysm.    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) 8)  Herpes, Genital Nos  (ICD-054.10) 9)  Allergic Rhinitis  (ICD-477.9) 10)  Hyperlipidemia  (ICD-272.4) 11)  Asthma  (ICD-493.90)  Current Problems (verified): 1)  Shoulder Impingement Syndrome, Right  (ICD-726.2) 2)  Back Pain  (ICD-724.5) 3)  Testicular Hypofunction  (ICD-257.2) 4)  Erectile Dysfunction, Organic  (ICD-607.84) 5)  Preventive  Health Care  (ICD-V70.0) 6)  Gouty Arthropathy  (ICD-274.0) 7)  Arthritis, Right Foot  (ICD-716.97) 8)  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 Once Daily At Times 2)  Multivitamins  Caps (Multiple Vitamin) .Marland Kitchen.. 1 Once Daily 3)  Krill Oil 1000 Mg Caps (Krill Oil) .... Two  Two Times A Day 4)  Livalo 4 Mg Tabs (Pitavastatin Calcium) .... One By Mouth Weekly On Sunday 5)  Testosterone Cypionate 200 Mg/ml Oil (Testosterone Cypionate) .... .5 Ml Every Week 6)  Valacyclovir Hcl 500 Mg Tabs (Valacyclovir Hcl) .... One By Mouth Three Times A Day As Directed 7)  Fluconazole 100 Mg Tabs (Fluconazole) .... One By Mouth Daily For 10  Current Medications (verified): 1)  Bayer Aspirin 325 Mg  Tabs (Aspirin) .... Once Daily At Times 2)  Multivitamins  Caps (Multiple Vitamin) .Marland Kitchen.. 1 Once Daily 3)  Fish Oil 1000 Mg Caps (Omega-3 Fatty Acids) .Marland Kitchen.. 1 Once Daily 4)  Livalo 4 Mg Tabs (Pitavastatin Calcium) .... One By Mouth Weekly On Sunday 5)  Testosterone Cypionate 200 Mg/ml Oil (Testosterone Cypionate) .... .5 Ml Every Week 6)  Valacyclovir Hcl 500 Mg Tabs (Valacyclovir Hcl) .... One By Mouth Three Times A Day As Directed 7)  Nasonex 50 Mcg/act Susp (  Mometasone Furoate) .... Two Spray in Nares Two Times A Day  Allergies (verified): No Known Drug Allergies  Past History:  Family History: Last updated: 09-21-08 father died of brain tumor at 82, uncle died with "bone cancer" two aunts with breast cancer mother Family History Hypertension  Social History: Last updated: 05/30/2007 Single Never Smoked Alcohol use-no Drug use-no Regular exercise-yes Domestic Partner  Risk Factors: Exercise: yes (05/30/2007)  Risk Factors: Smoking Status: never (04/21/2010)  Past medical, surgical, family  and social histories (including risk factors) reviewed, and no changes noted (except as noted below).  Past Medical History: Reviewed history from 05/30/2007 and no changes required. Asthma Hyperlipidemia Allergic rhinitis  Family History: Reviewed history from 2008-09-21 and no changes required. father died of brain tumor at 56, uncle died with "bone cancer" two aunts with breast cancer mother Family History Hypertension  Social History: Reviewed history from 05/30/2007 and no changes required. Single Never Smoked Alcohol use-no Drug use-no Regular exercise-yes Domestic Partner  Review of Systems  The patient denies anorexia, fever, weight loss, weight gain, vision loss, decreased hearing, hoarseness, chest pain, syncope, dyspnea on exertion, peripheral edema, prolonged cough, headaches, hemoptysis, abdominal pain, melena, hematochezia, severe indigestion/heartburn, hematuria, incontinence, genital sores, muscle weakness, suspicious skin lesions, transient blindness, difficulty walking, depression, unusual weight change, abnormal bleeding, enlarged lymph nodes, angioedema, and breast masses.    Physical Exam  General:  Well-developed,well-nourished,in no acute distress; alert,appropriate and cooperative throughout examination Head:  normocephalic and male-pattern balding.   Eyes:  pupils equal and pupils reactive to light.   Ears:  R ear normal and L ear normal.   Nose:  External nasal examination shows no deformity or inflammation. Nasal mucosa are pink and moist without lesions or exudates. Mouth:  pharynx pink and moist.   Neck:  No deformities, masses, or tenderness noted. Lungs:  Normal respiratory effort, chest expands symmetrically. Lungs are clear to auscultation, no crackles or wheezes. Heart:  Normal rate and regular rhythm. S1 and S2 normal without gallop, murmur, click, rub or other extra sounds. Msk:  joint tenderness and joint swelling.  elbows   Impression &  Recommendations:  Problem # 1:  TESTICULAR HYPOFUNCTION (ICD-257.2) stable levels  Problem # 2:  GOUTY ARTHROPATHY (ICD-274.0) this has been ruled out Elevate extremity; warm compresses, symptomatic relief and medication as directed.   Problem # 3:  ASTHMA (ICD-493.90) slight productive cough with out wheezing has ben on a zpack from an urgent care  Problem # 4:  ARTHRITIS, RIGHT FOOT (ICD-716.97) resolvec  Problem # 5:  ALLERGIC RHINITIS (ICD-477.9)  Discussed use of allergy medications and environmental measures.   His updated medication list for this problem includes:    Nasonex 50 Mcg/act Susp (Mometasone furoate) .Marland Kitchen..Marland Kitchen Two spray in nares two times a day  Complete Medication List: 1)  Bayer Aspirin 325 Mg Tabs (Aspirin) .... Once daily at times 2)  Multivitamins Caps (Multiple vitamin) .Marland Kitchen.. 1 once daily 3)  Fish Oil 1000 Mg Caps (Omega-3 fatty acids) .Marland Kitchen.. 1 once daily 4)  Livalo 4 Mg Tabs (Pitavastatin calcium) .... One by mouth weekly on sunday 5)  Testosterone Cypionate 200 Mg/ml Oil (Testosterone cypionate) .... Marland Kitchen5 ml every week 6)  Valacyclovir Hcl 500 Mg Tabs (Valacyclovir hcl) .... One by mouth three times a day as directed 7)  Nasonex 50 Mcg/act Susp (Mometasone furoate) .... Two spray in nares two times a day  Lipid Assessment/Plan:      Based on NCEP/ATP III, the patient's risk factor  category is "0-1 risk factors".  The patient's lipid goals are as follows: Total cholesterol goal is 200; LDL cholesterol goal is 160; HDL cholesterol goal is 40; Triglyceride goal is 150.     Patient Instructions: 1)  Please schedule a follow-up appointment in 2 months. 2)  trough testosterone  607.84

## 2010-09-02 NOTE — Letter (Signed)
Summary: [facility]  [facility]   Imported By: Maryln Gottron 03/28/2010 09:48:10  _____________________________________________________________________  External Attachment:    Type:   Image     Comment:   External Document

## 2010-09-10 NOTE — Assessment & Plan Note (Signed)
Summary: 2 month rov/acm   Vital Signs:  Patient profile:   50 year old male Height:      71 inches Weight:      179 pounds BMI:     25.06 Temp:     98.2 degrees F oral Pulse rate:   68 / minute Resp:     14 per minute BP sitting:   110 / 76  (left arm)  Vitals Entered By: Willy Eddy, LPN (August 19, 2010 11:32 AM) CC: roa, Lipid Management Is Patient Diabetic? No   Primary Care Provider:  Stacie Glaze MD  CC:  roa and Lipid Management.  History of Present Illness: adjustment of testosterone and fooolw up of lipid management the pts testicular changes have ceased with the lower doses. Persistant shoulder pain seen by Dr Rennis Chris for OA in surgery. Has been on meloxicam with relief. Has noted increased pain and cramping in stomach wth crunches ( lactic acid?) or mild hernia at umbilicus  Lipid Management History:      Positive NCEP/ATP III risk factors include male age 42 years old or older and HDL cholesterol less than 40.  Negative NCEP/ATP III risk factors include non-diabetic, no family history for ischemic heart disease, non-tobacco-user status, non-hypertensive, no ASHD (atherosclerotic heart disease), no prior stroke/TIA, no peripheral vascular disease, and no history of aortic aneurysm.     Preventive Screening-Counseling & Management  Alcohol-Tobacco     Smoking Status: never     Tobacco Counseling: not indicated; no tobacco use  Problems Prior to Update: 1)  Umbilical Hernia  (ICD-553.1) 2)  Shoulder Impingement Syndrome, Right  (ICD-726.2) 3)  Back Pain  (ICD-724.5) 4)  Testicular Hypofunction  (ICD-257.2) 5)  Erectile Dysfunction, Organic  (ICD-607.84) 6)  Preventive Health Care  (ICD-V70.0) 7)  Arthritis, Right Foot  (ICD-716.97) 8)  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) 9)  Herpes, Genital Nos  (ICD-054.10) 10)  Allergic Rhinitis  (ICD-477.9) 11)  Hyperlipidemia  (ICD-272.4) 12)  Asthma  (ICD-493.90)  Current Problems (verified): 1)  Shoulder  Impingement Syndrome, Right  (ICD-726.2) 2)  Back Pain  (ICD-724.5) 3)  Testicular Hypofunction  (ICD-257.2) 4)  Erectile Dysfunction, Organic  (ICD-607.84) 5)  Preventive Health Care  (ICD-V70.0) 6)  Arthritis, Right Foot  (ICD-716.97) 7)  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 Once Daily At Times 2)  Multivitamins  Caps (Multiple Vitamin) .Marland Kitchen.. 1 Once Daily 3)  Fish Oil 1000 Mg Caps (Omega-3 Fatty Acids) .Marland Kitchen.. 1 Once Daily 4)  Livalo 4 Mg Tabs (Pitavastatin Calcium) .... One By Mouth Weekly On Sunday 5)  Testosterone Cypionate 200 Mg/ml Oil (Testosterone Cypionate) .... .4  Ml Every Week 6)  Valacyclovir Hcl 500 Mg Tabs (Valacyclovir Hcl) .... One By Mouth Three Times A Day As Directed 7)  Nasonex 50 Mcg/act Susp (Mometasone Furoate) .... Two Spray in Nares Two Times A Day 8)  Bd Insulin Syringe 27.5g X 5/8" 2 Ml Misc (Insulin Syringe-Needle U-100) .... To Use With Testosteron Injections  Current Medications (verified): 1)  Bayer Aspirin 325 Mg  Tabs (Aspirin) .... Once Daily At Times 2)  Multivitamins  Caps (Multiple Vitamin) .Marland Kitchen.. 1 Once Daily 3)  Fish Oil 1000 Mg Caps (Omega-3 Fatty Acids) .Marland Kitchen.. 1 Once Daily 4)  Crestor 20 Mg Tabs (Rosuvastatin Calcium) .Marland KitchenMarland KitchenMarland Kitchen  One By Mouth Weekly 5)  Testosterone Cypionate 200 Mg/ml Oil (Testosterone Cypionate) .... .4  Ml Every Week 6)  Valacyclovir Hcl 500 Mg Tabs (Valacyclovir Hcl) .... One By Mouth Three Times A Day As Directed 7)  Nasonex 50 Mcg/act Susp (Mometasone Furoate) .... Two Spray in Nares Two Times A Day 8)  Bd Insulin Syringe 27.5g X 5/8" 2 Ml Misc (Insulin Syringe-Needle U-100) .... To Use With Testosteron Injections  Allergies (verified): No Known Drug Allergies  Past History:  Family History: Last updated: 2008-09-25 father died of  brain tumor at 3, uncle died with "bone cancer" two aunts with breast cancer mother Family History Hypertension  Social History: Last updated: 05/30/2007 Single Never Smoked Alcohol use-no Drug use-no Regular exercise-yes Domestic Partner  Risk Factors: Exercise: yes (05/30/2007)  Risk Factors: Smoking Status: never (08/19/2010)  Past medical, surgical, family and social histories (including risk factors) reviewed, and no changes noted (except as noted below).  Past Medical History: Reviewed history from 05/30/2007 and no changes required. Asthma Hyperlipidemia Allergic rhinitis  Family History: Reviewed history from 09/25/08 and no changes required. father died of brain tumor at 68, uncle died with "bone cancer" two aunts with breast cancer mother Family History Hypertension  Social History: Reviewed history from 05/30/2007 and no changes required. Single Never Smoked Alcohol use-no Drug use-no Regular exercise-yes Domestic Partner  Review of Systems  The patient denies anorexia, fever, weight loss, weight gain, vision loss, decreased hearing, hoarseness, chest pain, syncope, dyspnea on exertion, peripheral edema, prolonged cough, headaches, hemoptysis, abdominal pain, melena, hematochezia, severe indigestion/heartburn, hematuria, incontinence, genital sores, muscle weakness, suspicious skin lesions, transient blindness, difficulty walking, depression, unusual weight change, abnormal bleeding, enlarged lymph nodes, angioedema, and breast masses.    Physical Exam  General:  Well-developed,well-nourished,in no acute distress; alert,appropriate and cooperative throughout examination Head:  normocephalic and male-pattern balding.   Eyes:  pupils equal and pupils reactive to light.   Ears:  R ear normal and L ear normal.   Nose:  External nasal examination shows no deformity or inflammation. Nasal mucosa are pink and moist without lesions or exudates. Mouth:   pharynx pink and moist.   Abdomen:  slight umbilical hernia Msk:  joint tenderness and joint swelling.  elbows Pulses:  R and L carotid,radial,femoral,dorsalis pedis and posterior tibial pulses are full and equal bilaterally Extremities:  trace right pedal edema.   Neurologic:  alert & oriented X3 and finger-to-nose normal.     Impression & Recommendations:  Problem # 1:  SHOULDER IMPINGEMENT SYNDROME, RIGHT (ICD-726.2) Assessment Deteriorated rom exercizes  Problem # 2:  UMBILICAL HERNIA (ICD-553.1)  Problem # 3:  HYPERLIPIDEMIA (ICD-272.4) pulse therapy seems to be working His updated medication list for this problem includes:    Crestor 20 Mg Tabs (Rosuvastatin calcium) ..... One by mouth weekly  Labs Reviewed: SGOT: 22 (08/11/2010)   SGPT: 33 (08/11/2010)  Lipid Goals: Chol Goal: 200 (05/30/2007)   HDL Goal: 40 (05/30/2007)   LDL Goal: 160 (05/30/2007)   TG Goal: 150 (05/30/2007)  Prior 10 Yr Risk Heart Disease: 7 % (04/21/2010)   HDL:33.00 (08/11/2010), 31.70 (04/14/2010)  LDL:108 (11/20/2008), DEL (08/17/2008)  Chol:161 (08/11/2010), 168 (04/14/2010)  Trig:256.0 (08/11/2010), 365.0 (04/14/2010)  Complete Medication List: 1)  Bayer Aspirin 325 Mg Tabs (Aspirin) .... Once daily at times 2)  Multivitamins Caps (Multiple vitamin) .Marland Kitchen.. 1 once daily 3)  Fish Oil 1000 Mg Caps (Omega-3 fatty acids) .Marland Kitchen.. 1 once daily 4)  Crestor 20 Mg Tabs (Rosuvastatin calcium) .Marland KitchenMarland KitchenMarland Kitchen  One by mouth weekly 5)  Testosterone Cypionate 200 Mg/ml Oil (Testosterone cypionate) .... Marland Kitchen4  ml every week 6)  Valacyclovir Hcl 500 Mg Tabs (Valacyclovir hcl) .... One by mouth three times a day as directed 7)  Nasonex 50 Mcg/act Susp (Mometasone furoate) .... Two spray in nares two times a day 8)  Bd Insulin Syringe 27.5g X 5/8" 2 Ml Misc (Insulin syringe-needle u-100) .... To use with testosteron injections  Lipid Assessment/Plan:      Based on NCEP/ATP III, the patient's risk factor category is "0-1 risk  factors".  The patient's lipid goals are as follows: Total cholesterol goal is 200; LDL cholesterol goal is 160; HDL cholesterol goal is 40; Triglyceride goal is 150.    Patient Instructions: 1)  Please schedule a follow-up appointment in 3 months. 2)  testosterone  607.84 3)  Hepatic Panel prior to visit, ICD-9:995.20 4)  Lipid Panel prior to visit, ICD-9:272.4   Orders Added: 1)  Est. Patient Level IV [30865]

## 2010-11-14 ENCOUNTER — Encounter: Payer: Self-pay | Admitting: Internal Medicine

## 2010-11-17 ENCOUNTER — Other Ambulatory Visit: Payer: Self-pay

## 2010-11-18 ENCOUNTER — Other Ambulatory Visit (INDEPENDENT_AMBULATORY_CARE_PROVIDER_SITE_OTHER): Payer: BLUE CROSS/BLUE SHIELD | Admitting: Internal Medicine

## 2010-11-18 DIAGNOSIS — T887XXA Unspecified adverse effect of drug or medicament, initial encounter: Secondary | ICD-10-CM

## 2010-11-18 DIAGNOSIS — N529 Male erectile dysfunction, unspecified: Secondary | ICD-10-CM

## 2010-11-18 DIAGNOSIS — E785 Hyperlipidemia, unspecified: Secondary | ICD-10-CM

## 2010-11-18 LAB — LIPID PANEL
Cholesterol: 148 mg/dL (ref 0–200)
HDL: 32.1 mg/dL — ABNORMAL LOW (ref >39.00)
Total CHOL/HDL Ratio: 5
Triglycerides: 438 mg/dL — ABNORMAL HIGH (ref 0.0–149.0)
VLDL: 87.6 mg/dL — ABNORMAL HIGH (ref 0.0–40.0)

## 2010-11-18 LAB — TESTOSTERONE: Testosterone: 483.22 ng/dL (ref 350.00–890.00)

## 2010-11-18 LAB — HEPATIC FUNCTION PANEL
AST: 20 U/L (ref 0–37)
Albumin: 4 g/dL (ref 3.5–5.2)
Total Protein: 5.8 g/dL — ABNORMAL LOW (ref 6.0–8.3)

## 2010-11-24 ENCOUNTER — Encounter: Payer: Self-pay | Admitting: Internal Medicine

## 2010-11-24 ENCOUNTER — Ambulatory Visit (INDEPENDENT_AMBULATORY_CARE_PROVIDER_SITE_OTHER): Payer: BLUE CROSS/BLUE SHIELD | Admitting: Internal Medicine

## 2010-11-24 DIAGNOSIS — E291 Testicular hypofunction: Secondary | ICD-10-CM

## 2010-11-24 DIAGNOSIS — E785 Hyperlipidemia, unspecified: Secondary | ICD-10-CM

## 2010-11-24 MED ORDER — PITAVASTATIN CALCIUM 2 MG PO TABS: 1.0000 | ORAL_TABLET | ORAL | Status: DC

## 2010-11-26 NOTE — Progress Notes (Signed)
  Subjective:    Patient ID: Calvin Adams, male    DOB: 01-01-1961, 50 y.o.   MRN: [id number]  HPI Patient's 50 year old white male who presents for discussion of erectile dysfunction and testosterone replacement he's been on testosterone injections but due to a perception that it causes testicular change he had adjusted his dose downward.  He's been to a urologist before who assured him that there was no paratesticular change to examination but the patient has a strong perception that on an almost immediate basis when he takes a testosterone injection he senses some testicular change.   Review of Systems  Constitutional: Negative for fever and fatigue.  HENT: Negative for hearing loss, congestion, neck pain and postnasal drip.   Eyes: Negative for discharge, redness and visual disturbance.  Respiratory: Negative for cough, shortness of breath and wheezing.   Cardiovascular: Negative for leg swelling.  Gastrointestinal: Negative for abdominal pain, constipation and abdominal distention.  Genitourinary: Negative for urgency and frequency.  Musculoskeletal: Negative for joint swelling and arthralgias.  Skin: Negative for color change and rash.  Neurological: Negative for weakness and light-headedness.  Hematological: Negative for adenopathy.  Psychiatric/Behavioral: Negative for behavioral problems.   Past Medical History  Diagnosis Date  . Asthma   . Hyperlipidemia   . Allergy    History reviewed. No pertinent past surgical history.  reports that he has never smoked. He does not have any smokeless tobacco history on file. He reports that he does not drink alcohol or use illicit drugs. family history includes Brain cancer in his father; Breast cancer in his maternal aunt; and Hypertension in his mother. No Known Allergies     Objective:   Physical Exam  Nursing note and vitals reviewed. Constitutional: He appears well-developed and well-nourished.  HENT:  Head: Normocephalic and  atraumatic.  Eyes: Conjunctivae are normal. Pupils are equal, round, and reactive to light.  Neck: Normal range of motion. Neck supple.  Cardiovascular: Normal rate and regular rhythm.   Pulmonary/Chest: Effort normal and breath sounds normal.  Abdominal: Soft. Bowel sounds are normal.          Assessment & Plan:  Hypogonadism on testosterone we encouraged him to be consistent with his use of 0.4 cc of testosterone weekly basis with this he is achieved a level approximately 500 which is an optimal level for both androgen replacement and erectile function.  There is some psychological overlay with this patient's concern about testicular size on examination there is no variation that we have been able to detect and reassured him that the consistent use of the medication was the best use of the medication we spent more than 30 minutes face-to-face counseling this patient about this issue

## 2010-12-19 NOTE — Assessment & Plan Note (Signed)
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  He is  self-referred for evaluation of episodes of abdominal gas, bloating, and  mushy stools without abdominal discomfort.  These episodes have been  occurring for the last 5 years and are associated with extremely foul  flatus.  He recently had a course of amoxicillin and has had complete  relief of his symptomatology.  He is followed by Dr. Terri Piedra in  Dermatology and has biopsy-proven erythema nodosum.  He has never had  endoscopic examinations of his upper or lower bowel.   He denies any anorexia, weight loss, or other systemic complaints such  as arthralgias, skin rash, or visual difficulties.  He can almost  remember to the day 5 years ago when these episodes started, but he  cannot recall a prolonged viral or enteritis infection.  He denies any  specific food intolerances.  He has had some acid reflux symptoms in the  past that have been cured by  wheat grass powder.  The patient denies  melena or hematochezia.  He has no history of lactose intolerance and  does not abuse sorbitol or fructose.   PAST MEDICAL HISTORY:  His past medical history is remarkable for  hypercholesterolemia.   MEDICATIONS:  Multivitamin, omega-3 daily, and glucosamine daily with  fiber capsules.  He is currently completing a course of amoxicillin for  sinusitis.   ALLERGIES:  He denies drug allergies.   FAMILY HISTORY:  Remarkable for tons of cancer.  Several family  members have had lung, brain, and breast cancer.   SOCIAL HISTORY:  He is single and lives by himself.  He has a high  school education.  He does not smoke or abuse ethanol.   REVIEW OF SYSTEMS:  Otherwise noncontributory, without  cardiovascular,  pulmonary, genitourinary or neurological problems.   PHYSICAL EXAMINATION:  Exam shows him to be a healthy-appearing white  male appearing stated age in no acute distress.  He is 5-feet 9-1/2-inches tall and weighs 172 pounds.  Blood pressure is  110/76 and pulse is 72 and regular.  I cannot appreciate stigmata of chronic liver disease or thyromegaly.  CHEST:  Entirely clear to percussion and auscultation.  He is in a regular rhythm without murmurs, gallops or rubs on cardiac  exam.  I cannot appreciate hepatosplenomegaly, abdominal masses or tenderness.  Bowel sounds are normal.  Peripheral extremities are unremarkable.  RECTAL:  Exam was deferred.   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   RECOMMENDATIONS:  1. Check stool exams for culture and parasites.  2. Screening laboratory parameters including sprue antibodies.  3.  Outpatient endoscopy and colonoscopy.  4. Trial of Align probiotic therapy until workup can be completed.     Vania Rea. Jarold Motto, MD, Caleen Essex, FAGA  Electronically Signed    DRP/MedQ  DD: 08/05/2006  DT: 08/05/2006  Job #: [id number]   cc:   Stacie Glaze, MD

## 2011-02-05 ENCOUNTER — Other Ambulatory Visit (INDEPENDENT_AMBULATORY_CARE_PROVIDER_SITE_OTHER): Payer: BLUE CROSS/BLUE SHIELD

## 2011-02-05 ENCOUNTER — Other Ambulatory Visit: Payer: Self-pay | Admitting: Internal Medicine

## 2011-02-05 DIAGNOSIS — E785 Hyperlipidemia, unspecified: Secondary | ICD-10-CM

## 2011-02-05 DIAGNOSIS — E291 Testicular hypofunction: Secondary | ICD-10-CM

## 2011-02-05 LAB — LIPID PANEL
Cholesterol: 166 mg/dL (ref 0–200)
Triglycerides: 1024 mg/dL — ABNORMAL HIGH (ref 0.0–149.0)

## 2011-02-05 LAB — TESTOSTERONE: Testosterone: 444.57 ng/dL (ref 350.00–890.00)

## 2011-02-12 ENCOUNTER — Ambulatory Visit: Payer: BLUE CROSS/BLUE SHIELD | Admitting: Internal Medicine

## 2011-02-19 ENCOUNTER — Ambulatory Visit (INDEPENDENT_AMBULATORY_CARE_PROVIDER_SITE_OTHER): Payer: BLUE CROSS/BLUE SHIELD | Admitting: Internal Medicine

## 2011-02-19 ENCOUNTER — Encounter: Payer: Self-pay | Admitting: Internal Medicine

## 2011-02-19 DIAGNOSIS — E781 Pure hyperglyceridemia: Secondary | ICD-10-CM

## 2011-02-19 DIAGNOSIS — E291 Testicular hypofunction: Secondary | ICD-10-CM

## 2011-02-19 MED ORDER — TESTOSTERONE CYPIONATE 200 MG/ML IM SOLN: 80.0000 mg | INTRAMUSCULAR | Status: DC

## 2011-02-19 MED ORDER — EZETIMIBE 10 MG PO TABS: 10.0000 mg | ORAL_TABLET | Freq: Every day | ORAL | Status: DC

## 2011-02-19 NOTE — Progress Notes (Signed)
  Subjective:    Patient ID: Calvin Adams, male    DOB: 10/23/1960, 50 y.o.   MRN: [id number]  HPI Patient is a 50 year old white male who is followed for hyperlipidemia with marked elevated triglycerides and hypogonadism.  He has a complex history of his hypogonadism in that he has been on multiple regimens for replacing testosterone he feels like these regimens resulted testicular shrinkage upon examination we have not confirmed at [facility] with the urologist confirmed that there was significant testicular shrinkage but he states that at times after his injections he has noticed marked change in his testicular size but rebounds relatively quickly.  He also has hyperlipidemia with marked elevated triglycerides and recent monitoring revealed elevated triglycerides around 1000.     Review of Systems  Constitutional: Negative for fever and fatigue.  HENT: Negative for hearing loss, congestion, neck pain and postnasal drip.   Eyes: Negative for discharge, redness and visual disturbance.  Respiratory: Negative for cough, shortness of breath and wheezing.   Cardiovascular: Negative for leg swelling.  Gastrointestinal: Negative for abdominal pain, constipation and abdominal distention.  Genitourinary: Negative for urgency and frequency.  Musculoskeletal: Negative for joint swelling and arthralgias.  Skin: Negative for color change and rash.  Neurological: Negative for weakness and light-headedness.  Hematological: Negative for adenopathy.  Psychiatric/Behavioral: Negative for behavioral problems.   Past Medical History  Diagnosis Date  . Asthma   . Hyperlipidemia   . Allergy    No past surgical history on file.  reports that he has never smoked. He does not have any smokeless tobacco history on file. He reports that he does not drink alcohol or use illicit drugs. family history includes Brain cancer in his father; Breast cancer in his maternal aunt; and Hypertension in his mother. No Known  Allergies     Objective:   Physical Exam  Constitutional: He appears well-developed and well-nourished.  HENT:  Head: Normocephalic and atraumatic.  Eyes: Conjunctivae are normal. Pupils are equal, round, and reactive to light.  Neck: Normal range of motion. Neck supple.  Cardiovascular: Normal rate and regular rhythm.   Pulmonary/Chest: Effort normal and breath sounds normal.  Abdominal: Soft. Bowel sounds are normal.          Assessment & Plan:  Consider clomid if he cannot tolerate testosterone I am concerned that this testicular change if we you'll does contraindicate continued use of injection testosterone he will contemplate whether or not he wants to continue on this medication with his reported symptoms if he does not wish to use testosterone supplements we may consider the use of Clomid to stimulate his testicles.  This may be primary hypo-gonadic hypo-gonadism and  Clomid may be the best medication for this.  Consideration for roll to a geneticist for testing is also possible however the patient has no children or no plans for children.   Concerning his hypertriglyceridemia we will resume the use of zetia.

## 2011-02-19 NOTE — Patient Instructions (Signed)
Hold the testosterone injections and monitor testicular size

## 2011-04-20 ENCOUNTER — Other Ambulatory Visit (INDEPENDENT_AMBULATORY_CARE_PROVIDER_SITE_OTHER): Payer: BLUE CROSS/BLUE SHIELD

## 2011-04-20 DIAGNOSIS — E291 Testicular hypofunction: Secondary | ICD-10-CM

## 2011-04-20 DIAGNOSIS — E785 Hyperlipidemia, unspecified: Secondary | ICD-10-CM

## 2011-04-20 LAB — TESTOSTERONE: Testosterone: 215.73 ng/dL — ABNORMAL LOW (ref 350.00–890.00)

## 2011-04-20 LAB — LIPID PANEL
HDL: 38.5 mg/dL — ABNORMAL LOW (ref >39.00)
Total CHOL/HDL Ratio: 4
VLDL: 63.8 mg/dL — ABNORMAL HIGH (ref 0.0–40.0)

## 2011-04-20 LAB — LDL CHOLESTEROL, DIRECT: Direct LDL: 57.8 mg/dL

## 2011-04-27 ENCOUNTER — Ambulatory Visit (INDEPENDENT_AMBULATORY_CARE_PROVIDER_SITE_OTHER): Payer: BLUE CROSS/BLUE SHIELD | Admitting: Internal Medicine

## 2011-04-27 ENCOUNTER — Encounter: Payer: Self-pay | Admitting: Internal Medicine

## 2011-04-27 VITALS — BP 110/80 | HR 60 | Temp 98.0°F | Resp 14 | Ht 70.0 in | Wt 173.0 lb

## 2011-04-27 DIAGNOSIS — E785 Hyperlipidemia, unspecified: Secondary | ICD-10-CM

## 2011-04-27 DIAGNOSIS — E291 Testicular hypofunction: Secondary | ICD-10-CM

## 2011-04-27 MED ORDER — EZETIMIBE 10 MG PO TABS: 10.0000 mg | ORAL_TABLET | Freq: Every day | ORAL | Status: DC

## 2011-04-27 MED ORDER — TESTOSTERONE 30 MG/ACT TD SOLN: 1.0000 | Freq: Every day | TRANSDERMAL | Status: DC

## 2011-04-27 NOTE — Progress Notes (Signed)
  Subjective:    Patient ID: Calvin Adams, male    DOB: 13-Mar-1961, 50 y.o.   MRN: [id number]  HPI 50 year old white male is followed for hyperlipidemia and hypogonadism he had been on injection testosterone but he noted that his testicles were "drinking" he had been seen by the urologist they could not find it effective the cause for testicular shrinkage and felt his testicles were normal in size but the patient definitely feels that his testicles were drinking.  He states that after he discontinued the injections his testicles have not returned to "within normal size" he also was started on TPN for hyperlipidemia with excellent results total cholesterol has lowered HDL has increased and LDL see it below 70.  His triglycerides have also dropped significantly from over 1000 to around 300.  We discussed alternatives for testosterone replacement   Review of Systems  Constitutional: Negative for fever and fatigue.  HENT: Negative for hearing loss, congestion, neck pain and postnasal drip.   Eyes: Negative for discharge, redness and visual disturbance.  Respiratory: Negative for cough, shortness of breath and wheezing.   Cardiovascular: Negative for leg swelling.  Gastrointestinal: Negative for abdominal pain, constipation and abdominal distention.  Genitourinary: Negative for urgency and frequency.  Musculoskeletal: Negative for joint swelling and arthralgias.  Skin: Negative for color change and rash.  Neurological: Negative for weakness and light-headedness.  Hematological: Negative for adenopathy.  Psychiatric/Behavioral: Negative for behavioral problems.   Past Medical History  Diagnosis Date  . Asthma   . Hyperlipidemia   . Allergy    No past surgical history on file.  reports that he has never smoked. He does not have any smokeless tobacco history on file. He reports that he does not drink alcohol or use illicit drugs. family history includes Brain cancer in his father; Breast  cancer in his maternal aunt; and Hypertension in his mother. No Known Allergies     Objective:   Physical Exam  Constitutional: He appears well-developed and well-nourished.  HENT:  Head: Normocephalic and atraumatic.  Eyes: Conjunctivae are normal. Pupils are equal, round, and reactive to light.  Neck: Normal range of motion. Neck supple.  Cardiovascular: Normal rate and regular rhythm.   Pulmonary/Chest: Effort normal and breath sounds normal.  Abdominal: Soft. Bowel sounds are normal.          Assessment & Plan:  Initiate change with axillary testosterone at a low dose on a consistent basis to see if this will affect his testicles size.... Continue the zetia and moniter for resolution of the hypertriglyceridemia

## 2011-04-27 NOTE — Assessment & Plan Note (Signed)
The pt has been treated with zetia for lipids No side effect Stable

## 2011-05-04 ENCOUNTER — Telehealth: Payer: Self-pay | Admitting: Internal Medicine

## 2011-05-04 DIAGNOSIS — E785 Hyperlipidemia, unspecified: Secondary | ICD-10-CM

## 2011-05-04 MED ORDER — EZETIMIBE 10 MG PO TABS: 10.0000 mg | ORAL_TABLET | Freq: Every day | ORAL | Status: DC

## 2011-05-04 NOTE — Telephone Encounter (Signed)
Pt called and said that [facility] on [address] did not rcv escript for ezetimibe (ZETIA) 10 MG tablet . Pls resend.

## 2011-06-10 ENCOUNTER — Ambulatory Visit (INDEPENDENT_AMBULATORY_CARE_PROVIDER_SITE_OTHER): Payer: BLUE CROSS/BLUE SHIELD | Admitting: Internal Medicine

## 2011-06-10 VITALS — BP 126/80 | HR 72 | Temp 98.2°F | Resp 16 | Ht 70.0 in | Wt 179.0 lb

## 2011-06-10 DIAGNOSIS — E291 Testicular hypofunction: Secondary | ICD-10-CM

## 2011-06-10 NOTE — Patient Instructions (Signed)
The patient is instructed to continue all medications as prescribed. Schedule followup with check out clerk upon leaving the clinic  

## 2011-06-12 ENCOUNTER — Telehealth: Payer: Self-pay | Admitting: Internal Medicine

## 2011-06-12 MED ORDER — TESTOSTERONE CYPIONATE 200 MG/ML IM SOLN: INTRAMUSCULAR | Status: DC

## 2011-06-12 NOTE — Telephone Encounter (Signed)
Called in.

## 2011-06-12 NOTE — Telephone Encounter (Signed)
Pt req refill of testosterone cypionate (DEPO-TESTOSTERONE) 200 MG/ML injection to [facility] on [address].

## 2011-08-04 HISTORY — PX: UMBILICAL HERNIA REPAIR: SHX196

## 2011-08-17 ENCOUNTER — Ambulatory Visit (INDEPENDENT_AMBULATORY_CARE_PROVIDER_SITE_OTHER): Payer: BLUE CROSS/BLUE SHIELD | Admitting: Internal Medicine

## 2011-08-17 ENCOUNTER — Telehealth: Payer: Self-pay | Admitting: Internal Medicine

## 2011-08-17 ENCOUNTER — Encounter: Payer: Self-pay | Admitting: Internal Medicine

## 2011-08-17 VITALS — BP 130/80 | HR 72 | Temp 98.3°F | Resp 16 | Ht 70.0 in | Wt 184.0 lb

## 2011-08-17 DIAGNOSIS — H00019 Hordeolum externum unspecified eye, unspecified eyelid: Secondary | ICD-10-CM

## 2011-08-17 DIAGNOSIS — E291 Testicular hypofunction: Secondary | ICD-10-CM

## 2011-08-17 MED ORDER — TOBRAMYCIN-DEXAMETHASONE 0.3-0.1 % OP SUSP
1.0000 [drp] | OPHTHALMIC | Status: AC
Start: 2011-08-17 — End: 2011-08-27

## 2011-08-17 NOTE — Telephone Encounter (Signed)
lmoam to make ov. Thanks.

## 2011-08-17 NOTE — Telephone Encounter (Signed)
May have lipids- 272,4--thanks

## 2011-08-17 NOTE — Progress Notes (Signed)
  Subjective:    Patient ID: Calvin Adams, male    DOB: [birth year], 51 y.o.   MRN: [id number]  HPI  The pt has increased to .45 ml does and has noted increased strength Testicular sensation of shrinkage but not detectable on exams   Review of Systems  Constitutional: Negative for fever and fatigue.  HENT: Negative for hearing loss, congestion, neck pain and postnasal drip.   Eyes: Negative for discharge, redness and visual disturbance.  Respiratory: Negative for cough, shortness of breath and wheezing.   Cardiovascular: Negative for leg swelling.  Gastrointestinal: Negative for abdominal pain, constipation and abdominal distention.  Genitourinary: Negative for urgency and frequency.  Musculoskeletal: Negative for joint swelling and arthralgias.  Skin: Negative for color change and rash.  Neurological: Negative for weakness and light-headedness.  Hematological: Negative for adenopathy.  Psychiatric/Behavioral: Negative for behavioral problems.   Past Medical History  Diagnosis Date  . Asthma   . Hyperlipidemia   . Allergy     History   Social History  . Marital Status: Significant Other    Spouse Name: N/A    Number of Children: N/A  . Years of Education: N/A   Occupational History  . Not on file.   Social History Main Topics  . Smoking status: Never Smoker   . Smokeless tobacco: Not on file  . Alcohol Use: No  . Drug Use: No  . Sexually Active: Yes   Other Topics Concern  . Not on file   Social History Narrative   Regular exercise    No past surgical history on file.  Family History  Problem Relation Age of Onset  . Hypertension Mother   . Brain cancer Father   . Breast cancer Maternal Aunt     No Known Allergies  Current Outpatient Prescriptions on File Prior to Visit  Medication Sig Dispense Refill  . aspirin 325 MG tablet Take 325 mg by mouth daily.       Marland Kitchen ezetimibe (ZETIA) 10 MG tablet Take 1 tablet (10 mg total) by mouth daily.  30 tablet  11   . fish oil-omega-3 fatty acids 1000 MG capsule Take 2 g by mouth daily.        . Multiple Vitamin (MULTIVITAMIN) capsule Take 1 capsule by mouth daily.        Marland Kitchen testosterone cypionate (DEPO-TESTOSTERONE) 200 MG/ML injection .4ml every week  10 mL  0  . valACYclovir (VALTREX) 500 MG tablet Take 500 mg by mouth 3 (three) times daily as needed.          BP 130/80  Pulse 72  Temp 98.3 F (36.8 C)  Resp 16  Ht 5\' 10"  (1.778 m)  Wt 184 lb (83.462 kg)  BMI 26.40 kg/m2       Objective:   Physical Exam  Constitutional: He appears well-developed and well-nourished.  HENT:  Head: Normocephalic and atraumatic.  Eyes: Conjunctivae are normal. Pupils are equal, round, and reactive to light.  Neck: Normal range of motion. Neck supple.  Cardiovascular: Normal rate and regular rhythm.   Pulmonary/Chest: Effort normal and breath sounds normal.  Abdominal: Soft. Bowel sounds are normal.          Assessment & Plan:  monitoring for androgen replacement Has questions about growth hormone use.

## 2011-08-17 NOTE — Telephone Encounter (Signed)
Pt was just seen and has made an appt for 11-23-2011. He would like an order to check his triglycerides. Ok to schedule?

## 2011-08-17 NOTE — Patient Instructions (Signed)
The patient is instructed to continue all medications as prescribed. Schedule followup with check out clerk upon leaving the clinic  

## 2011-08-18 LAB — TESTOSTERONE, FREE, TOTAL, SHBG
Testosterone, Free: 155.4 pg/mL (ref 47.0–244.0)
Testosterone-% Free: 2.7 % (ref 1.6–2.9)

## 2011-09-01 ENCOUNTER — Telehealth: Payer: Self-pay | Admitting: Internal Medicine

## 2011-09-01 NOTE — Telephone Encounter (Signed)
Advised pt if she begins to run another fever, vomits more, or has chills and continues diarrhea, to call back to be seen.  Push liquids, and bland diet when she is able.  She feels she seems to be improving, and will call if she feels any worse at all.

## 2011-09-01 NOTE — Telephone Encounter (Signed)
Pt called and said that they may have ? Food poisoning. Pt had nausea, vomitting and chills, last night after eating dinner. Pt says that all symptoms have subsided today, but now pt has diarrhea, fatigue and a lot of stomach sounds. Pt is req call back from nurse asap. Pt did not feel like coming in for ov.

## 2011-09-02 NOTE — Telephone Encounter (Signed)
No further correspondence

## 2011-11-03 ENCOUNTER — Encounter: Payer: Self-pay | Admitting: Internal Medicine

## 2011-11-03 NOTE — Progress Notes (Signed)
  Subjective:    Patient ID: Calvin Adams, male    DOB: [birth year], 51 y.o.   MRN: [id number]  HPI  Patient presents for followup of hypogonadism and testosterone replacement after being changed to axillary applied testosterone.  He states that he feels better on the medications and he has to do better testosterone level  Review of Systems  Constitutional: Negative for fever and fatigue.  HENT: Negative for hearing loss, congestion, neck pain and postnasal drip.   Eyes: Negative for discharge, redness and visual disturbance.  Respiratory: Negative for cough, shortness of breath and wheezing.   Cardiovascular: Negative for leg swelling.  Gastrointestinal: Negative for abdominal pain, constipation and abdominal distention.  Genitourinary: Negative for urgency and frequency.  Musculoskeletal: Negative for joint swelling and arthralgias.  Skin: Negative for color change and rash.  Neurological: Negative for weakness and light-headedness.  Hematological: Negative for adenopathy.  Psychiatric/Behavioral: Negative for behavioral problems.       Objective:   Physical Exam  Nursing note and vitals reviewed. Constitutional: He appears well-developed and well-nourished.  HENT:  Head: Normocephalic and atraumatic.  Eyes: Conjunctivae are normal. Pupils are equal, round, and reactive to light.  Neck: Normal range of motion. Neck supple.  Cardiovascular: Normal rate and regular rhythm.   Pulmonary/Chest: Effort normal and breath sounds normal.  Abdominal: Soft. Bowel sounds are normal.          Assessment & Plan:  Continue the axillary applied testosterone 1 pump applied under arm on a daily basis

## 2011-11-16 ENCOUNTER — Other Ambulatory Visit (INDEPENDENT_AMBULATORY_CARE_PROVIDER_SITE_OTHER): Payer: BLUE CROSS/BLUE SHIELD

## 2011-11-16 DIAGNOSIS — E785 Hyperlipidemia, unspecified: Secondary | ICD-10-CM

## 2011-11-16 DIAGNOSIS — N529 Male erectile dysfunction, unspecified: Secondary | ICD-10-CM

## 2011-11-16 LAB — LIPID PANEL
Cholesterol: 157 mg/dL (ref 0–200)
HDL: 37.2 mg/dL — ABNORMAL LOW (ref >39.00)
Triglycerides: 410 mg/dL — ABNORMAL HIGH (ref 0.0–149.0)

## 2011-11-16 LAB — LDL CHOLESTEROL, DIRECT: Direct LDL: 62.4 mg/dL

## 2011-11-16 NOTE — Progress Notes (Signed)
Addended by: Bonnye Fava on: 11/16/2011 08:23 AM   Modules accepted: Orders

## 2011-11-23 ENCOUNTER — Encounter: Payer: Self-pay | Admitting: Internal Medicine

## 2011-11-23 ENCOUNTER — Ambulatory Visit (INDEPENDENT_AMBULATORY_CARE_PROVIDER_SITE_OTHER): Payer: BLUE CROSS/BLUE SHIELD | Admitting: Internal Medicine

## 2011-11-23 VITALS — BP 110/78 | HR 68 | Temp 98.2°F | Resp 16 | Ht 69.0 in | Wt 176.0 lb

## 2011-11-23 DIAGNOSIS — E291 Testicular hypofunction: Secondary | ICD-10-CM

## 2011-11-23 DIAGNOSIS — E785 Hyperlipidemia, unspecified: Secondary | ICD-10-CM

## 2011-11-23 DIAGNOSIS — M1990 Unspecified osteoarthritis, unspecified site: Secondary | ICD-10-CM

## 2011-11-23 MED ORDER — TESTOSTERONE CYPIONATE 200 MG/ML IM SOLN: INTRAMUSCULAR | Status: DC

## 2011-11-23 NOTE — Progress Notes (Signed)
  Subjective:    Patient ID: Calvin Adams, male    DOB: [birth year], 51 y.o.   MRN: [id number]  HPI  Increased joint pain, multiple joints Good testosterone levels on weekly dosing CPX next visit  Review of Systems  Constitutional: Negative for fever and fatigue.  HENT: Negative for hearing loss, congestion, neck pain and postnasal drip.   Eyes: Negative for discharge, redness and visual disturbance.  Respiratory: Negative for cough, shortness of breath and wheezing.   Cardiovascular: Negative for leg swelling.  Gastrointestinal: Negative for abdominal pain, constipation and abdominal distention.  Genitourinary: Negative for urgency and frequency.  Musculoskeletal: Positive for myalgias, joint swelling and arthralgias.  Skin: Negative for color change and rash.  Neurological: Negative for weakness and light-headedness.  Hematological: Negative for adenopathy.  Psychiatric/Behavioral: Negative for behavioral problems.   Past Medical History  Diagnosis Date  . Asthma   . Hyperlipidemia   . Allergy     History   Social History  . Marital Status: Single    Spouse Name: N/A    Number of Children: N/A  . Years of Education: N/A   Occupational History  . Not on file.   Social History Main Topics  . Smoking status: Never Smoker   . Smokeless tobacco: Not on file  . Alcohol Use: No  . Drug Use: No  . Sexually Active: Yes   Other Topics Concern  . Not on file   Social History Narrative   Regular exercise    Past Surgical History  Procedure Date  . Spermaticeal removal     Family History  Problem Relation Age of Onset  . Hypertension Mother   . Brain cancer Father   . Breast cancer Maternal Aunt     No Known Allergies  Current Outpatient Prescriptions on File Prior to Visit  Medication Sig Dispense Refill  . aspirin 325 MG tablet Take 325 mg by mouth daily.       Marland Kitchen ezetimibe (ZETIA) 10 MG tablet Take 1 tablet (10 mg total) by mouth daily.  30 tablet  11  .  fish oil-omega-3 fatty acids 1000 MG capsule Take 2 g by mouth daily.        . Multiple Vitamin (MULTIVITAMIN) capsule Take 1 capsule by mouth daily.        Marland Kitchen testosterone cypionate (DEPOTESTOTERONE CYPIONATE) 200 MG/ML injection INJECT 0.4  ML (CC) INTRAMUSCULARLY ONCE A WEEK  10 mL  0    BP 110/78  Pulse 68  Temp 98.2 F (36.8 C)  Resp 16  Ht 5\' 9"  (1.753 m)  Wt 176 lb (79.833 kg)  BMI 25.99 kg/m2       Objective:   Physical Exam  Constitutional: He appears well-developed and well-nourished.  HENT:  Head: Normocephalic and atraumatic.  Eyes: Conjunctivae normal are normal. Pupils are equal, round, and reactive to light.  Neck: Normal range of motion. Neck supple.  Cardiovascular: Normal rate and regular rhythm.   Pulmonary/Chest: Effort normal and breath sounds normal.  Abdominal: Soft. Bowel sounds are normal.          Assessment & Plan:  Patient has excellent testosterone levels on weekly injection.  We recommended he continue disease injection therapy he has increased joint pain that parallels increased physical activity and work out.  I do not believe that this represents an inflammatory arthritis or a reactive arthritis but certainly a trial off the testosterone could be helpful in differentiating the arthritis

## 2011-11-23 NOTE — Patient Instructions (Addendum)
The patient is instructed to continue all medications as prescribed. Schedule followup with check out clerk upon leaving the clinic The fish oil should also help the joint pain Aleve on a day should be safe

## 2011-11-24 LAB — ANA: Anti Nuclear Antibody(ANA): NEGATIVE

## 2011-11-25 NOTE — Progress Notes (Signed)
Pt informed

## 2012-02-18 ENCOUNTER — Other Ambulatory Visit (INDEPENDENT_AMBULATORY_CARE_PROVIDER_SITE_OTHER): Payer: BLUE CROSS/BLUE SHIELD

## 2012-02-18 DIAGNOSIS — Z0000 Encounter for general adult medical examination without abnormal findings: Secondary | ICD-10-CM

## 2012-02-18 LAB — POCT URINALYSIS DIPSTICK
Blood, UA: NEGATIVE
Glucose, UA: NEGATIVE
Nitrite, UA: NEGATIVE
Protein, UA: NEGATIVE
Spec Grav, UA: 1.015
Urobilinogen, UA: 0.2
pH, UA: 6

## 2012-02-18 LAB — TSH: TSH: 3.05 u[IU]/mL (ref 0.35–5.50)

## 2012-02-18 LAB — CBC WITH DIFFERENTIAL/PLATELET
Basophils Absolute: 0 10*3/uL (ref 0.0–0.1)
Eosinophils Relative: 5.2 % — ABNORMAL HIGH (ref 0.0–5.0)
MCV: 93.7 fl (ref 78.0–100.0)
Monocytes Absolute: 0.5 10*3/uL (ref 0.1–1.0)
Monocytes Relative: 10.4 % (ref 3.0–12.0)
Neutrophils Relative %: 59.3 % (ref 43.0–77.0)
Platelets: 153 10*3/uL (ref 150.0–400.0)
RDW: 13.4 % (ref 11.5–14.6)
WBC: 4.4 10*3/uL — ABNORMAL LOW (ref 4.5–10.5)

## 2012-02-18 LAB — LIPID PANEL
Cholesterol: 169 mg/dL (ref 0–200)
HDL: 39.6 mg/dL (ref >39.00)
Total CHOL/HDL Ratio: 4
Triglycerides: 531 mg/dL — ABNORMAL HIGH (ref 0.0–149.0)
VLDL: 106.2 mg/dL — ABNORMAL HIGH (ref 0.0–40.0)

## 2012-02-18 LAB — BASIC METABOLIC PANEL
CO2: 29 mEq/L (ref 19–32)
Calcium: 9.1 mg/dL (ref 8.4–10.5)
Potassium: 4.4 mEq/L (ref 3.5–5.1)
Sodium: 138 mEq/L (ref 135–145)

## 2012-02-18 LAB — HEPATIC FUNCTION PANEL
ALT: 30 U/L (ref 0–53)
Bilirubin, Direct: 0.1 mg/dL (ref 0.0–0.3)
Total Bilirubin: 1.3 mg/dL — ABNORMAL HIGH (ref 0.3–1.2)

## 2012-02-26 ENCOUNTER — Ambulatory Visit (INDEPENDENT_AMBULATORY_CARE_PROVIDER_SITE_OTHER): Payer: BLUE CROSS/BLUE SHIELD | Admitting: Internal Medicine

## 2012-02-26 ENCOUNTER — Telehealth: Payer: Self-pay | Admitting: Speech Pathology

## 2012-02-26 ENCOUNTER — Encounter: Payer: Self-pay | Admitting: Internal Medicine

## 2012-02-26 VITALS — BP 120/70 | HR 68 | Temp 98.2°F | Resp 16 | Ht 69.0 in | Wt 176.0 lb

## 2012-02-26 DIAGNOSIS — M1990 Unspecified osteoarthritis, unspecified site: Secondary | ICD-10-CM

## 2012-02-26 DIAGNOSIS — E785 Hyperlipidemia, unspecified: Secondary | ICD-10-CM

## 2012-02-26 DIAGNOSIS — E291 Testicular hypofunction: Secondary | ICD-10-CM

## 2012-02-26 DIAGNOSIS — K429 Umbilical hernia without obstruction or gangrene: Secondary | ICD-10-CM

## 2012-02-26 DIAGNOSIS — Z0000 Encounter for general adult medical examination without abnormal findings: Secondary | ICD-10-CM

## 2012-02-26 MED ORDER — MELOXICAM 15 MG PO TABS: 15.0000 mg | ORAL_TABLET | Freq: Every day | ORAL | Status: DC

## 2012-02-26 NOTE — Telephone Encounter (Signed)
Pt called and said Dr. Lovell Sheehan was going to refer him to a Surgeon for his Hernia and I do not see one, yet. Please advise. Thanks.

## 2012-02-26 NOTE — Patient Instructions (Signed)
The patient is instructed to continue all medications as prescribed. Schedule followup with check out clerk upon leaving the clinic  

## 2012-02-26 NOTE — Progress Notes (Signed)
Subjective:    Patient ID: Calvin Adams, male    DOB: [birth year], 51 y.o.   MRN: [id number]  HPI cpx is a 51 year old male who presents for complete physical examination without complaints he has mild-to-moderate hyperlipidemia on Zetia he has hypogonadism on testosterone.     Review of Systems  Constitutional: Negative for fever and fatigue.  HENT: Negative for hearing loss, congestion, neck pain and postnasal drip.   Eyes: Negative for discharge, redness and visual disturbance.  Respiratory: Negative for cough, shortness of breath and wheezing.   Cardiovascular: Negative for leg swelling.  Gastrointestinal: Negative for abdominal pain, constipation and abdominal distention.  Genitourinary: Negative for urgency and frequency.  Musculoskeletal: Negative for joint swelling and arthralgias.  Skin: Negative for color change and rash.  Neurological: Negative for weakness and light-headedness.  Hematological: Negative for adenopathy.  Psychiatric/Behavioral: Negative for behavioral problems.   Past Medical History  Diagnosis Date  . Asthma   . Hyperlipidemia   . Allergy     History   Social History  . Marital Status: Significant Other    Spouse Name: N/A    Number of Children: N/A  . Years of Education: N/A   Occupational History  . Not on file.   Social History Main Topics  . Smoking status: Never Smoker   . Smokeless tobacco: Not on file  . Alcohol Use: No  . Drug Use: No  . Sexually Active: Yes   Other Topics Concern  . Not on file   Social History Narrative   Regular exercise    No past surgical history on file.  Family History  Problem Relation Age of Onset  . Hypertension Mother   . Brain cancer Father   . Breast cancer Maternal Aunt     No Known Allergies  Current Outpatient Prescriptions on File Prior to Visit  Medication Sig Dispense Refill  . aspirin 325 MG tablet Take 325 mg by mouth daily.       Marland Kitchen ezetimibe (ZETIA) 10 MG tablet Take 1  tablet (10 mg total) by mouth daily.  30 tablet  11  . fish oil-omega-3 fatty acids 1000 MG capsule Take 2 g by mouth daily.        . Multiple Vitamin (MULTIVITAMIN) capsule Take 1 capsule by mouth daily.        Marland Kitchen testosterone cypionate (DEPO-TESTOSTERONE) 200 MG/ML injection .4ml every  10 days  10 mL  0    BP 120/70  Pulse 68  Temp 98.2 F (36.8 C)  Resp 16  Ht 5\' 9"  (1.753 m)  Wt 176 lb (79.833 kg)  BMI 25.99 kg/m2        Objective:   Physical Exam  Nursing note and vitals reviewed. Constitutional: He is oriented to person, place, and time. He appears well-developed and well-nourished.  HENT:  Head: Normocephalic and atraumatic.  Eyes: Conjunctivae are normal. Pupils are equal, round, and reactive to light.  Neck: Normal range of motion. Neck supple.  Cardiovascular: Normal rate and regular rhythm.   Pulmonary/Chest: Effort normal and breath sounds normal.  Abdominal: Soft. Bowel sounds are normal.  Genitourinary: Rectum normal and prostate normal.  Musculoskeletal: Normal range of motion.  Neurological: He is alert and oriented to person, place, and time.  Skin: Skin is warm and dry.  Psychiatric: He has a normal mood and affect. His behavior is normal.          Assessment & Plan:   Patient presents for yearly preventative medicine examination.  all immunizations and health maintenance protocols were reviewed with the patient and they are up to date with these protocols.   screening laboratory values were reviewed with the patient including screening of hyperlipidemia PSA renal function and hepatic function.   There medications past medical history social history problem list and allergies were reviewed in detail.   Goals were established with regard to weight loss exercise diet in compliance with medications reviewd paleo diet

## 2012-02-26 NOTE — Telephone Encounter (Signed)
Dr Lovell Sheehan put in order for surgeon

## 2012-02-26 NOTE — Progress Notes (Signed)
  Subjective:    Patient ID: Calvin Adams, male    DOB: July 11, 1961, 51 y.o.   MRN: [id number]  HPI    Review of Systems     Objective:   Physical Exam  Patient has a quarter-sized umbilical hernia that is easily reduced but he states it is discomforting and he appears to believe that is growing      Assessment & Plan:  Refer to Gen. surgery for consideration of repair

## 2012-02-26 NOTE — Addendum Note (Signed)
Addended by: Stacie Glaze on: 02/26/2012 04:41 PM   Modules accepted: Orders

## 2012-03-10 ENCOUNTER — Ambulatory Visit (INDEPENDENT_AMBULATORY_CARE_PROVIDER_SITE_OTHER): Payer: BC Managed Care – PPO | Admitting: General Surgery

## 2012-03-10 ENCOUNTER — Encounter (INDEPENDENT_AMBULATORY_CARE_PROVIDER_SITE_OTHER): Payer: Self-pay | Admitting: General Surgery

## 2012-03-10 VITALS — BP 120/76 | HR 62 | Temp 97.9°F | Resp 16 | Ht 69.0 in | Wt 175.8 lb

## 2012-03-10 DIAGNOSIS — K429 Umbilical hernia without obstruction or gangrene: Secondary | ICD-10-CM

## 2012-03-10 NOTE — Progress Notes (Signed)
Subjective:   Umbilical hernia  Patient ID: Calvin Adams, male   DOB: Dec 08, 1960, 51 y.o.   MRN: [id number]  HPI Patient is a 51 year old male referred through the courtesy of Dr. Lovell Sheehan for an umbilical hernia. The patient enjoys weightlifting and about 2 years ago had a sudden severe pain at his umbilicus while doing abdominal crunches. He soon noted a small bulge at the umbilicus. This has gotten a little on to her last couple of years. It is occasionally tender. He has no symptoms of incarceration or GI symptoms. Past Medical History  Diagnosis Date  . Asthma   . Hyperlipidemia   . Allergy    Past Surgical History  Procedure Date  . Spermaticeal removal    Current Outpatient Prescriptions  Medication Sig Dispense Refill  . aspirin 325 MG tablet Take 325 mg by mouth daily.       Marland Kitchen ezetimibe (ZETIA) 10 MG tablet Take 1 tablet (10 mg total) by mouth daily.  30 tablet  11  . fish oil-omega-3 fatty acids 1000 MG capsule Take 2 g by mouth daily.        . meloxicam (MOBIC) 15 MG tablet Take 1 tablet (15 mg total) by mouth daily.  30 tablet  11  . Multiple Vitamin (MULTIVITAMIN) capsule Take 1 capsule by mouth daily.        Marland Kitchen testosterone cypionate (DEPO-TESTOSTERONE) 200 MG/ML injection .4ml every  10 days  10 mL  0   No Known Allergies   Review of Systems  Respiratory: Negative.   Cardiovascular: Negative.   Gastrointestinal: Negative.   Musculoskeletal: Positive for arthralgias.       Objective:   Physical Exam BP 120/76  Pulse 62  Temp 97.9 F (36.6 C) (Temporal)  Resp 16  Ht 5\' 9"  (1.753 m)  Wt 175 lb 12.8 oz (79.742 kg)  BMI 25.96 kg/m2  SpO2 97% General: Well-developed muscular male in no distress Skin: The rash or infection HEENT: No palpable masses. Sclera nonicteric Lungs: Clear equal breath sounds bilaterally Cardiovascular: Regular rate and rhythm without murmur. No edema Abdomen: Generally soft and nontender. There is approximately 1-1-1/2 cm hernia  directly at the umbilicus that is reducible but tender. Extremities: No edema or joint swelling Neurologic: Alert and fully oriented. Gait normal.    Assessment:     Symptomatic umbilical hernia. I have recommended repair with a ventral patch under general anesthesia as an outpatient. We discussed the indications for the procedure and alternatives of observation. We discussed risks of surgery including anesthetic complications, bleeding, infection, chronic pain, and recurrence. He was like to proceed with repair which I think is the best choice    Plan:     Repair of umbilical hernia as an outpatient under general anesthesia

## 2012-03-14 ENCOUNTER — Other Ambulatory Visit: Payer: Self-pay | Admitting: Internal Medicine

## 2012-03-25 ENCOUNTER — Telehealth (INDEPENDENT_AMBULATORY_CARE_PROVIDER_SITE_OTHER): Payer: Self-pay | Admitting: General Surgery

## 2012-03-25 NOTE — Telephone Encounter (Signed)
Tried to call pt, no answer 

## 2012-03-25 NOTE — Telephone Encounter (Signed)
Message copied by Mariella Saa on Fri Mar 25, 2012  1:10 PM ------      Message from: Maryan Puls      Created: Thu Mar 24, 2012  4:41 PM      Regarding: FW: surgery cx      Contact: [phone]       Patient would like to speak with you regarding his surgery.            Maryan Puls      ----- Message -----         From: Earnestine Mealing         Sent: 03/22/2012   9:03 AM           To: Mariella Saa, MD, Maryan Puls, CMA      Subject: surgery cx                                               Talked with patient who is sch for umbilical hernia repair 04/12/12. He wants to cx until the first of year but wants to know if you think that will be ok. He did not want to talk to nurse, wanted your opinion before he actually cx. Thanks Calvin Bible [phone]

## 2012-05-06 ENCOUNTER — Encounter (INDEPENDENT_AMBULATORY_CARE_PROVIDER_SITE_OTHER): Payer: BC Managed Care – PPO | Admitting: General Surgery

## 2012-05-19 ENCOUNTER — Other Ambulatory Visit (INDEPENDENT_AMBULATORY_CARE_PROVIDER_SITE_OTHER): Payer: Self-pay

## 2012-05-19 DIAGNOSIS — E785 Hyperlipidemia, unspecified: Secondary | ICD-10-CM

## 2012-05-19 DIAGNOSIS — E291 Testicular hypofunction: Secondary | ICD-10-CM

## 2012-05-19 LAB — LIPID PANEL
Total CHOL/HDL Ratio: 4
VLDL: 70.6 mg/dL — ABNORMAL HIGH (ref 0.0–40.0)

## 2012-05-19 LAB — LDL CHOLESTEROL, DIRECT: Direct LDL: 78.4 mg/dL

## 2012-05-30 ENCOUNTER — Encounter: Payer: Self-pay | Admitting: Internal Medicine

## 2012-05-30 ENCOUNTER — Ambulatory Visit (INDEPENDENT_AMBULATORY_CARE_PROVIDER_SITE_OTHER): Payer: Self-pay | Admitting: Internal Medicine

## 2012-05-30 VITALS — BP 110/70 | HR 64 | Resp 14 | Ht 69.0 in | Wt 178.0 lb

## 2012-05-30 DIAGNOSIS — M19019 Primary osteoarthritis, unspecified shoulder: Secondary | ICD-10-CM

## 2012-05-30 DIAGNOSIS — E291 Testicular hypofunction: Secondary | ICD-10-CM

## 2012-05-30 MED ORDER — METHYLPREDNISOLONE ACETATE 40 MG/ML IJ SUSP: 40.0000 mg | Freq: Once | INTRAMUSCULAR | Status: DC

## 2012-05-30 MED ORDER — VALACYCLOVIR HCL 500 MG PO TABS: 500.0000 mg | ORAL_TABLET | Freq: Every day | ORAL | Status: DC

## 2012-05-30 NOTE — Progress Notes (Signed)
Subjective:    Patient ID: Calvin Adams, male    DOB: Dec 19, 1960, 51 y.o.   MRN: [id number]  HPI Increased testosterone to .5 cc weekly has resulted in a steady therapeutic dose monitoring lipids Moving lipids with decreased triglycerides Asthma stable      Review of Systems  Constitutional: Negative for fever and fatigue.  HENT: Negative for hearing loss, congestion, neck pain and postnasal drip.   Eyes: Negative for discharge, redness and visual disturbance.  Respiratory: Negative for cough, shortness of breath and wheezing.   Cardiovascular: Negative for leg swelling.  Gastrointestinal: Negative for abdominal pain, constipation and abdominal distention.  Genitourinary: Negative for urgency and frequency.  Musculoskeletal: Negative for joint swelling and arthralgias.  Skin: Negative for color change and rash.  Neurological: Negative for weakness and light-headedness.  Hematological: Negative for adenopathy.  Psychiatric/Behavioral: Negative for behavioral problems.   Past Medical History  Diagnosis Date  . Asthma   . Hyperlipidemia   . Allergy     History   Social History  . Marital Status: Single    Spouse Name: N/A    Number of Children: N/A  . Years of Education: N/A   Occupational History  . Not on file.   Social History Main Topics  . Smoking status: Never Smoker   . Smokeless tobacco: Not on file  . Alcohol Use: No  . Drug Use: No  . Sexually Active: Yes   Other Topics Concern  . Not on file   Social History Narrative   Regular exercise    Past Surgical History  Procedure Date  . Spermaticeal removal     Family History  Problem Relation Age of Onset  . Hypertension Mother   . Brain cancer Father   . Breast cancer Maternal Aunt     No Known Allergies  Current Outpatient Prescriptions on File Prior to Visit  Medication Sig Dispense Refill  . aspirin 325 MG tablet Take 325 mg by mouth daily.       . fish oil-omega-3 fatty acids 1000  MG capsule Take 2 g by mouth daily.        . meloxicam (MOBIC) 15 MG tablet Take 1 tablet (15 mg total) by mouth daily.  30 tablet  11  . Multiple Vitamin (MULTIVITAMIN) capsule Take 1 capsule by mouth daily.        Marland Kitchen testosterone cypionate (DEPOTESTOTERONE CYPIONATE) 200 MG/ML injection INJECT 0.4  ML (CC) INTRAMUSCULARLY ONCE A WEEK  10 mL  0  . DISCONTD: ezetimibe (ZETIA) 10 MG tablet Take 1 tablet (10 mg total) by mouth daily.  30 tablet  11    BP 110/70  Pulse 64  Resp 14  Ht 5\' 9"  (1.753 m)  Wt 178 lb (80.74 kg)  BMI 26.29 kg/m2       Objective:   Physical Exam  Nursing note and vitals reviewed. Constitutional: He appears well-developed and well-nourished.  HENT:  Head: Normocephalic and atraumatic.  Eyes: Conjunctivae normal are normal. Pupils are equal, round, and reactive to light.  Neck: Normal range of motion. Neck supple.  Cardiovascular: Normal rate and regular rhythm.   Pulmonary/Chest: Effort normal and breath sounds normal.  Abdominal: Soft. Bowel sounds are normal.  Musculoskeletal: Normal range of motion.          Assessment & Plan:  Patient has stable testosterone on replacement Chronic shoulder pain Increased with shoulder presses Orthopedic consult bones spurs and he got prednisone shot that helped significantly ( two years  ago) left greater that right.  Informed consent obtained and the patient's left shoulder was prepped with betadine. Local anesthesia was obtained with topical spray. Then 40 mg of Depo-Medrol and 1/2 cc of lidocaine was injected into the joint space. The patient tolerated the procedure without complications. Post injection care discussed with patient.

## 2012-05-30 NOTE — Patient Instructions (Signed)
The patient is instructed to continue all medications as prescribed. Schedule followup with check out clerk upon leaving the clinic  

## 2012-06-09 ENCOUNTER — Telehealth (INDEPENDENT_AMBULATORY_CARE_PROVIDER_SITE_OTHER): Payer: Self-pay

## 2012-06-09 NOTE — Telephone Encounter (Signed)
Pt called requesting sooner appt to see Dr Johna Sheriff for umb hernia. Pt states the hernia has increased in size since Dr Johna Sheriff examined him last. He states he is able to reduce the hernia but it is becoming more uncomfortable. Pt advised I will send a msg to Dr [employer] assistant re: this request.

## 2012-06-17 ENCOUNTER — Telehealth: Payer: Self-pay | Admitting: Internal Medicine

## 2012-06-17 ENCOUNTER — Other Ambulatory Visit: Payer: Self-pay | Admitting: Internal Medicine

## 2012-06-17 NOTE — Telephone Encounter (Signed)
Pt called and needs to get a new coupon for Zetia. Pls call when ready for pick up.

## 2012-06-17 NOTE — Telephone Encounter (Signed)
Pt informed coupon up front

## 2012-06-21 NOTE — Telephone Encounter (Signed)
Patient has follow up appointment on Friday June 24, 2012 w/Dr. Johna Sheriff

## 2012-06-24 ENCOUNTER — Ambulatory Visit (INDEPENDENT_AMBULATORY_CARE_PROVIDER_SITE_OTHER): Payer: BC Managed Care – PPO | Admitting: General Surgery

## 2012-06-24 ENCOUNTER — Encounter (INDEPENDENT_AMBULATORY_CARE_PROVIDER_SITE_OTHER): Payer: Self-pay | Admitting: General Surgery

## 2012-06-24 VITALS — BP 118/66 | HR 72 | Temp 97.8°F | Resp 16 | Ht 70.0 in | Wt 175.5 lb

## 2012-06-24 DIAGNOSIS — K429 Umbilical hernia without obstruction or gangrene: Secondary | ICD-10-CM

## 2012-06-24 NOTE — Progress Notes (Signed)
Chief complaint: Painful umbilical hernia  History: Patient was seen in August, referred by Dr. Lovell Sheehan, for a several month history of a symptomatic umbilical hernia. He was trying to postpone repair until next year but he reports he is having increasing discomfort with physical activity and was to go ahead and get this fixed.  Past Medical History  Diagnosis Date  . Asthma   . Hyperlipidemia   . Allergy   . Umbilical hernia    Past Surgical History  Procedure Date  . Spermaticeal removal    Current Outpatient Prescriptions  Medication Sig Dispense Refill  . aspirin 325 MG tablet Take 325 mg by mouth as needed.       . ezetimibe (ZETIA) 10 MG tablet Take 10 mg by mouth daily.      . fish oil-omega-3 fatty acids 1000 MG capsule Take 2 g by mouth daily.        . meloxicam (MOBIC) 15 MG tablet Take 1 tablet (15 mg total) by mouth daily.  30 tablet  11  . Multiple Vitamin (MULTIVITAMIN) capsule Take 1 capsule by mouth daily.        Marland Kitchen testosterone cypionate (DEPOTESTOTERONE CYPIONATE) 200 MG/ML injection INJECT 0.4  ML (CC) INTRAMUSCULARLY ONCE A WEEK  10 mL  0  . ZETIA 10 MG tablet TAKE ONE TABLET BY MOUTH DAILY  30 tablet  10   No Known Allergies History  Substance Use Topics  . Smoking status: Never Smoker   . Smokeless tobacco: Not on file  . Alcohol Use: No   Exam: BP 118/66  Pulse 72  Temp 97.8 F (36.6 C) (Temporal)  Resp 16  Ht 5\' 10"  (1.778 m)  Wt 175 lb 8 oz (79.606 kg)  BMI 25.18 kg/m2 General: Fit Caucasian male in no distress Pertinent findings today are limited abdomen. Again noted was an approximately 1-1/2 cm tender but reducible umbilical hernia.  Assessment and plan: Umbilical hernia increasingly symptomatic. Will plan to go ahead and get him scheduled for open repair with a ventral patch under general anesthesia as an outpatient in the near future. We can discuss the nature of the surgery and expected recovery as well as the risks of bleeding, infection,  anesthetic complications and recurrence. All his questions were answered.

## 2012-07-08 ENCOUNTER — Telehealth (INDEPENDENT_AMBULATORY_CARE_PROVIDER_SITE_OTHER): Payer: Self-pay

## 2012-07-08 NOTE — Telephone Encounter (Signed)
The pt called to make sure he can take Tylenol preop for a headache.  I told him that is fine up until a day or two before surgery.

## 2012-07-14 ENCOUNTER — Encounter (INDEPENDENT_AMBULATORY_CARE_PROVIDER_SITE_OTHER): Payer: BC Managed Care – PPO | Admitting: General Surgery

## 2012-07-15 ENCOUNTER — Other Ambulatory Visit (INDEPENDENT_AMBULATORY_CARE_PROVIDER_SITE_OTHER): Payer: Self-pay

## 2012-07-15 ENCOUNTER — Telehealth (INDEPENDENT_AMBULATORY_CARE_PROVIDER_SITE_OTHER): Payer: Self-pay | Admitting: General Surgery

## 2012-07-15 DIAGNOSIS — R112 Nausea with vomiting, unspecified: Secondary | ICD-10-CM

## 2012-07-15 DIAGNOSIS — K429 Umbilical hernia without obstruction or gangrene: Secondary | ICD-10-CM

## 2012-07-15 DIAGNOSIS — Z9889 Other specified postprocedural states: Secondary | ICD-10-CM

## 2012-07-15 MED ORDER — PROMETHAZINE HCL 12.5 MG PO TABS: 12.5000 mg | ORAL_TABLET | ORAL | Status: DC | PRN

## 2012-07-15 NOTE — Telephone Encounter (Signed)
Call received to request medicine for severe nausea be called in to [facility]:  [phone].

## 2012-07-20 ENCOUNTER — Telehealth (INDEPENDENT_AMBULATORY_CARE_PROVIDER_SITE_OTHER): Payer: Self-pay | Admitting: General Surgery

## 2012-07-20 NOTE — Telephone Encounter (Signed)
Pt called to ask about the gas used during laproscopic surgery.  He is still having a small amount of discomfort on his side.  Reassured pt that the gas would eventually be absorbed.  He can take OTC Aleve or Advil prn.  He understands.

## 2012-07-25 ENCOUNTER — Telehealth (INDEPENDENT_AMBULATORY_CARE_PROVIDER_SITE_OTHER): Payer: Self-pay

## 2012-07-25 ENCOUNTER — Telehealth: Payer: Self-pay | Admitting: Internal Medicine

## 2012-07-25 NOTE — Telephone Encounter (Signed)
Patient states he has gotten a cold and cough.  Request z-pack and cough medicine.  Advised patient to contact his PCP for these prescriptions.  Advised patient he may also use a pillow to help 'splint" abd during coughing periods.  Patient agreed to call PCP.

## 2012-07-25 NOTE — Telephone Encounter (Signed)
Attempt to call patient at 1935, no answer, message left to call office.

## 2012-07-26 ENCOUNTER — Ambulatory Visit (INDEPENDENT_AMBULATORY_CARE_PROVIDER_SITE_OTHER): Payer: BC Managed Care – PPO | Admitting: Family

## 2012-07-26 ENCOUNTER — Encounter: Payer: Self-pay | Admitting: Family

## 2012-07-26 ENCOUNTER — Telehealth: Payer: Self-pay | Admitting: Internal Medicine

## 2012-07-26 VITALS — BP 102/64 | Temp 98.9°F | Wt 174.0 lb

## 2012-07-26 DIAGNOSIS — R05 Cough: Secondary | ICD-10-CM

## 2012-07-26 DIAGNOSIS — R059 Cough, unspecified: Secondary | ICD-10-CM

## 2012-07-26 DIAGNOSIS — J209 Acute bronchitis, unspecified: Secondary | ICD-10-CM

## 2012-07-26 DIAGNOSIS — R0982 Postnasal drip: Secondary | ICD-10-CM

## 2012-07-26 MED ORDER — HYDROCOD POLST-CHLORPHEN POLST 10-8 MG/5ML PO LQCR: 5.0000 mL | Freq: Two times a day (BID) | ORAL | Status: DC | PRN

## 2012-07-26 MED ORDER — AZITHROMYCIN 250 MG PO TABS: ORAL_TABLET | ORAL | Status: DC

## 2012-07-26 NOTE — Telephone Encounter (Signed)
[facility] Triage Call Report Triage Record Num: [id number] Operator: Andreas Ohm Patient Name: Calvin Adams Call Date & Time: 07/25/2012 6:15:27PM Patient Phone: [phone] PCP: Darryll Capers Patient Gender: Male PCP Fax : [phone] Patient DOB: 1960-12-08 Practice Name: [facility]  Reason for Call: Caller: [name]/Patient; PCP: Darryll Capers (Adults only); CB#: [phone]; Call regarding Cough/Congestion; Onset 07/22/12. Afebrile. Pt wants a Zpak called in and a cough medication. Pt coughing up green and yellow mucous. No shortness of breath or chest discomfort. Emergent symptom positive per Cough- Adult Protocol due to 'New onset or worsening coug and recent surgery or trauma' See ED. Pt says it not surgery related and he gets this every year. Pt refused ED. Caller will call tomorrow for an appt.  Protocol(s) Used: Cough - Adult Recommended Outcome per Protocol: See ED Immediately Reason for Outcome: New onset or worsening cough AND recent (within 4 wks.) surgery or trauma, or prolonged immobilization (bedrest, long travel), or smoker taking medication with estrogen

## 2012-07-26 NOTE — Progress Notes (Signed)
Subjective:    Patient ID: Calvin Adams, male    DOB: [birth year], 51 y.o.   MRN: [id number]  HPI 51 year old white male, nonsmoker, patient of Dr. Lovell Sheehan is in today with complaints of worsening cough, congestion x4 days. He's been taken over-the-counter Mucinex DM and makes his symptoms worse and cough more phlegm. Patient is status post umbilical hernia repair 11 days ago.   Review of Systems  Constitutional: Negative.   HENT: Positive for congestion and postnasal drip.   Eyes: Negative.   Respiratory: Positive for cough. Negative for shortness of breath and wheezing.   Gastrointestinal:       Incision umbilical area  Musculoskeletal: Negative.   Skin: Negative.   Neurological: Negative.   Psychiatric/Behavioral: Negative.    Past Medical History  Diagnosis Date  . Asthma   . Hyperlipidemia   . Allergy   . Umbilical hernia     History   Social History  . Marital Status: Single    Spouse Name: N/A    Number of Children: N/A  . Years of Education: N/A   Occupational History  . Not on file.   Social History Main Topics  . Smoking status: Never Smoker   . Smokeless tobacco: Not on file  . Alcohol Use: No  . Drug Use: No  . Sexually Active: Yes   Other Topics Concern  . Not on file   Social History Narrative   Regular exercise    Past Surgical History  Procedure Date  . Spermaticeal removal     Family History  Problem Relation Age of Onset  . Hypertension Mother   . Brain cancer Father   . Breast cancer Maternal Aunt     No Known Allergies  Current Outpatient Prescriptions on File Prior to Visit  Medication Sig Dispense Refill  . aspirin 325 MG tablet Take 325 mg by mouth as needed.       . ezetimibe (ZETIA) 10 MG tablet Take 10 mg by mouth daily.      . fish oil-omega-3 fatty acids 1000 MG capsule Take 2 g by mouth daily.        . meloxicam (MOBIC) 15 MG tablet Take 1 tablet (15 mg total) by mouth daily.  30 tablet  11  . Multiple Vitamin  (MULTIVITAMIN) capsule Take 1 capsule by mouth daily.        . promethazine (PHENERGAN) 12.5 MG tablet Take 1 tablet (12.5 mg total) by mouth every 4 (four) hours as needed for nausea.  12 tablet  1  . testosterone cypionate (DEPOTESTOTERONE CYPIONATE) 200 MG/ML injection INJECT 0.4  ML (CC) INTRAMUSCULARLY ONCE A WEEK  10 mL  0  . ZETIA 10 MG tablet TAKE ONE TABLET BY MOUTH DAILY  30 tablet  10    BP 102/64  Temp 98.9 F (37.2 C) (Oral)  Wt 174 lb (78.926 kg)chart    Objective:   Physical Exam  Constitutional: He is oriented to person, place, and time. He appears well-developed and well-nourished.  HENT:  Right Ear: External ear normal.  Left Ear: External ear normal.  Nose: Nose normal.  Mouth/Throat: Oropharynx is clear and moist.       Left maxillary sinus tenderness to palpation.  Neck: Normal range of motion. Neck supple.  Cardiovascular: Normal rate, regular rhythm and normal heart sounds.   Pulmonary/Chest: Effort normal.       Coarse breath sounds noted but no wheezing  Abdominal: Soft. Bowel sounds are normal.  2 cm incision noted below the umbilicus. Appears to be healing well. No drainage or discharge.  Neurological: He is alert and oriented to person, place, and time.  Skin: Skin is warm and dry.  Psychiatric: He has a normal mood and affect.          Assessment & Plan:  Assessment: Postnasal drip, cough, bronchitis, status post umbilical hernia repair  Plan: Patient requests a Z-Pak as directed. Tussionex 1 teaspoon twice a day as needed for cough. Warned of drowsiness. Patient call the office if symptoms worsen or persist. Recheck a schedule, and as needed.

## 2012-07-29 ENCOUNTER — Encounter (INDEPENDENT_AMBULATORY_CARE_PROVIDER_SITE_OTHER): Payer: BC Managed Care – PPO | Admitting: General Surgery

## 2012-08-09 ENCOUNTER — Telehealth: Payer: Self-pay | Admitting: Internal Medicine

## 2012-08-09 NOTE — Telephone Encounter (Signed)
[facility] (After Hours Triage) Fax: [phone] From: [facility] Date/ Time: 08/08/2012 5:09 PM Taken By: Jethro BolusClarisa Fling Facility: home Patient: Calvin Adams, Calvin Adams DOB: 04/24/1961 Phone: [phone] Reason for Call: The coupon that was given with his last Rx expired on 08/02/12, is requesting another one (coupon only) for this year. Medication Zetia, states that Kendal Hymen normally gets this ready for him. Regarding Appointment:

## 2012-08-09 NOTE — Telephone Encounter (Signed)
Left message on machine All zetia coupons expired 08-02-12--will call pt if we get any  more

## 2012-08-11 ENCOUNTER — Ambulatory Visit (INDEPENDENT_AMBULATORY_CARE_PROVIDER_SITE_OTHER): Payer: BC Managed Care – PPO | Admitting: General Surgery

## 2012-08-11 VITALS — BP 128/72 | HR 58 | Temp 98.6°F | Resp 18 | Ht 69.0 in | Wt 175.2 lb

## 2012-08-11 DIAGNOSIS — Z09 Encounter for follow-up examination after completed treatment for conditions other than malignant neoplasm: Secondary | ICD-10-CM

## 2012-08-11 NOTE — Progress Notes (Signed)
History: Patient returns 4 weeks following open repair of his umbilical hernia. He is doing well having experienced just expected soreness that is essentially resolved.  Exam: His incision is well-healed. There is no evidence of recurrence with coughing or straining and no evidence of complication.  Assessment and plan: Doing well following umbilical hernia repair. He is released to full activity and will return as needed.

## 2012-08-11 NOTE — Patient Instructions (Addendum)
No activity limitations

## 2012-08-29 ENCOUNTER — Encounter: Payer: Self-pay | Admitting: Internal Medicine

## 2012-08-29 ENCOUNTER — Ambulatory Visit (INDEPENDENT_AMBULATORY_CARE_PROVIDER_SITE_OTHER): Payer: BC Managed Care – PPO | Admitting: Internal Medicine

## 2012-08-29 VITALS — BP 120/74 | HR 72 | Temp 98.2°F | Resp 16 | Ht 69.0 in | Wt 176.0 lb

## 2012-08-29 DIAGNOSIS — E785 Hyperlipidemia, unspecified: Secondary | ICD-10-CM

## 2012-08-29 DIAGNOSIS — N529 Male erectile dysfunction, unspecified: Secondary | ICD-10-CM

## 2012-08-29 DIAGNOSIS — E291 Testicular hypofunction: Secondary | ICD-10-CM

## 2012-08-29 LAB — LIPID PANEL
Total CHOL/HDL Ratio: 5
Triglycerides: 620 mg/dL — ABNORMAL HIGH (ref 0.0–149.0)

## 2012-08-29 LAB — TESTOSTERONE: Testosterone: 207.66 ng/dL — ABNORMAL LOW (ref 350.00–890.00)

## 2012-08-29 MED ORDER — CLOMIPHENE CITRATE 50 MG PO TABS: 25.0000 mg | ORAL_TABLET | ORAL | Status: DC

## 2012-08-29 NOTE — Patient Instructions (Addendum)
The patient is instructed to continue all medications as prescribed. Schedule followup with check out clerk upon leaving the clinic Take clomid twice a week

## 2012-08-29 NOTE — Progress Notes (Signed)
Subjective:    Patient ID: Calvin Adams, male    DOB: 02/19/1961, 52 y.o.   MRN: [id number]  HPI  The testicular hypofunction function with an inability to tolerate multiple forms of testosterone replacement.  He said he notices marked change in his testicular size when he is on testosterone as well as fatique   Review of Systems  Constitutional: Negative for fever and fatigue.  HENT: Negative for hearing loss, congestion, neck pain and postnasal drip.   Eyes: Negative for discharge, redness and visual disturbance.  Respiratory: Negative for cough, shortness of breath and wheezing.   Cardiovascular: Negative for leg swelling.  Gastrointestinal: Negative for abdominal pain, constipation and abdominal distention.  Genitourinary: Negative for urgency and frequency.  Musculoskeletal: Negative for joint swelling and arthralgias.  Skin: Negative for color change and rash.  Neurological: Negative for weakness and light-headedness.  Hematological: Negative for adenopathy.  Psychiatric/Behavioral: Negative for behavioral problems.   Past Medical History  Diagnosis Date  . Asthma   . Hyperlipidemia   . Allergy   . Umbilical hernia     History   Social History  . Marital Status: Single    Spouse Name: N/A    Number of Children: N/A  . Years of Education: N/A   Occupational History  . Not on file.   Social History Main Topics  . Smoking status: Never Smoker   . Smokeless tobacco: Not on file  . Alcohol Use: No  . Drug Use: No  . Sexually Active: Yes   Other Topics Concern  . Not on file   Social History Narrative   Regular exercise    Past Surgical History  Procedure Date  . Spermaticeal removal     Family History  Problem Relation Age of Onset  . Hypertension Mother   . Brain cancer Father   . Breast cancer Maternal Aunt     No Known Allergies  Current Outpatient Prescriptions on File Prior to Visit  Medication Sig Dispense Refill  . aspirin 325 MG  tablet Take 325 mg by mouth as needed.       . chlorpheniramine-HYDROcodone (TUSSIONEX PENNKINETIC ER) 10-8 MG/5ML LQCR Take 5 mLs by mouth every 12 (twelve) hours as needed.  140 mL  0  . ezetimibe (ZETIA) 10 MG tablet Take 10 mg by mouth daily.      . fish oil-omega-3 fatty acids 1000 MG capsule Take 2 g by mouth daily.        . meloxicam (MOBIC) 15 MG tablet Take 1 tablet (15 mg total) by mouth daily.  30 tablet  11  . Multiple Vitamin (MULTIVITAMIN) capsule Take 1 capsule by mouth daily.        . promethazine (PHENERGAN) 12.5 MG tablet Take 1 tablet (12.5 mg total) by mouth every 4 (four) hours as needed for nausea.  12 tablet  1    BP 120/74  Pulse 72  Temp 98.2 F (36.8 C)  Resp 16  Ht 5\' 9"  (1.753 m)  Wt 176 lb (79.833 kg)  BMI 25.99 kg/m2       Objective:   Physical Exam  Nursing note and vitals reviewed. Constitutional: He is oriented to person, place, and time. He appears well-developed and well-nourished.  HENT:  Head: Normocephalic and atraumatic.  Eyes: Conjunctivae normal are normal. Pupils are equal, round, and reactive to light.  Neck: Normal range of motion. Neck supple.  Cardiovascular: Normal rate and regular rhythm.   Pulmonary/Chest: Effort normal and breath sounds  normal.  Abdominal: Soft. Bowel sounds are normal.  Neurological: He is alert and oriented to person, place, and time.  Skin: Skin is warm and dry.  Psychiatric: He has a normal mood and affect. His behavior is normal.          Assessment & Plan:  Testicular hypofunction we'll try Clomid 25 mg by mouth twice weekly and monitor testicular function and testosterone levels.  Patient needs a lipid and liver drawn today to see if he has regressed in his progress towards treatment goals with stopping the Zetia

## 2012-09-06 ENCOUNTER — Telehealth: Payer: Self-pay | Admitting: Internal Medicine

## 2012-09-06 NOTE — Telephone Encounter (Signed)
LDL was great as well as total but the triglycerides are high ( sugar and cards in diet drive this) Testosterone is low and we will see what the clomid does

## 2012-09-06 NOTE — Telephone Encounter (Signed)
Left message for pt to call back  °

## 2012-09-06 NOTE — Telephone Encounter (Signed)
Pt aware.

## 2012-09-06 NOTE — Telephone Encounter (Signed)
Patient called stating that he would like a call back with lab results. Please assist.  °

## 2012-10-24 ENCOUNTER — Telehealth: Payer: Self-pay | Admitting: Internal Medicine

## 2012-10-24 NOTE — Telephone Encounter (Signed)
Samples and info to get coupon off internet Left message on machine

## 2012-10-24 NOTE — Telephone Encounter (Signed)
Pt stated zetia cost 59.00 a month. Pt would like samples or coupon for zetia if none available patient would like generic zetia call into [facility] [address]

## 2012-12-05 ENCOUNTER — Ambulatory Visit (INDEPENDENT_AMBULATORY_CARE_PROVIDER_SITE_OTHER): Payer: BC Managed Care – PPO | Admitting: Internal Medicine

## 2012-12-05 ENCOUNTER — Encounter: Payer: Self-pay | Admitting: Internal Medicine

## 2012-12-05 VITALS — BP 120/80 | HR 68 | Temp 98.3°F | Resp 16 | Ht 69.0 in | Wt 172.0 lb

## 2012-12-05 DIAGNOSIS — E785 Hyperlipidemia, unspecified: Secondary | ICD-10-CM

## 2012-12-05 DIAGNOSIS — E291 Testicular hypofunction: Secondary | ICD-10-CM

## 2012-12-05 NOTE — Progress Notes (Signed)
  Subjective:    Patient ID: Calvin Adams, male    DOB: Sep 22, 1960, 52 y.o.   MRN: [id number]  HPI On clomid for hypogonadism with testicular change from Im and topical testosterone Checking testosterone levels today discussion of lipid follow up for elevated triglycerides    Review of Systems  Constitutional: Negative for fever and fatigue.  HENT: Negative for hearing loss, congestion, neck pain and postnasal drip.   Eyes: Negative for discharge, redness and visual disturbance.  Respiratory: Negative for cough, shortness of breath and wheezing.   Cardiovascular: Negative for leg swelling.  Gastrointestinal: Negative for abdominal pain, constipation and abdominal distention.  Genitourinary: Negative for urgency and frequency.  Musculoskeletal: Negative for joint swelling and arthralgias.  Skin: Negative for color change and rash.  Neurological: Negative for weakness and light-headedness.  Hematological: Negative for adenopathy.  Psychiatric/Behavioral: Negative for behavioral problems.       Objective:   Physical Exam  Nursing note and vitals reviewed. Constitutional: He appears well-developed and well-nourished.  HENT:  Head: Normocephalic and atraumatic.  Eyes: Conjunctivae are normal. Pupils are equal, round, and reactive to light.  Neck: Normal range of motion. Neck supple.  Cardiovascular: Normal rate and regular rhythm.   Pulmonary/Chest: Effort normal and breath sounds normal.  Abdominal: Soft. Bowel sounds are normal.          Assessment & Plan:  Hypogonadism on clomid Mild OA discussion of gluten ED Lipid monitoring

## 2012-12-06 LAB — TESTOSTERONE, FREE, TOTAL, SHBG
Sex Hormone Binding: 30 nmol/L (ref 13–71)
Testosterone: 225 ng/dL — ABNORMAL LOW (ref 300–890)

## 2012-12-06 LAB — LIPID PANEL
Total CHOL/HDL Ratio: 5
Triglycerides: 431 mg/dL — ABNORMAL HIGH (ref 0.0–149.0)

## 2012-12-07 LAB — LDL CHOLESTEROL, DIRECT: Direct LDL: 79.1 mg/dL

## 2012-12-15 ENCOUNTER — Encounter: Payer: Self-pay | Admitting: Internal Medicine

## 2012-12-15 ENCOUNTER — Telehealth: Payer: Self-pay | Admitting: Internal Medicine

## 2012-12-15 NOTE — Telephone Encounter (Signed)
Pt is concerned about his triglycerides and his testosterone levels from his last labs. According to his  MYChart, levels were high. Pt would like your advice on what to do to lower these.

## 2013-01-31 ENCOUNTER — Encounter: Payer: Self-pay | Admitting: Family Medicine

## 2013-01-31 ENCOUNTER — Ambulatory Visit (INDEPENDENT_AMBULATORY_CARE_PROVIDER_SITE_OTHER): Payer: BC Managed Care – PPO | Admitting: Family Medicine

## 2013-01-31 VITALS — BP 110/82 | Temp 98.2°F | Wt 172.0 lb

## 2013-01-31 DIAGNOSIS — J309 Allergic rhinitis, unspecified: Secondary | ICD-10-CM

## 2013-01-31 DIAGNOSIS — H6990 Unspecified Eustachian tube disorder, unspecified ear: Secondary | ICD-10-CM

## 2013-01-31 DIAGNOSIS — H6993 Unspecified Eustachian tube disorder, bilateral: Secondary | ICD-10-CM

## 2013-01-31 MED ORDER — FLUTICASONE PROPIONATE 50 MCG/ACT NA SUSP: 2.0000 | Freq: Every day | NASAL | Status: DC

## 2013-01-31 NOTE — Progress Notes (Signed)
Chief Complaint  Patient presents with  . ears clogged up    HPI:  Acute visit for "ears stopped up": -on and off ears feel filled up recently -got some water in L ear today -a little allergy issues on and off -denies fevers, pain, hearing loss  Hyperlipidemia: -wants sample of zetia to get to appt with PCP because running out   ROS: See pertinent positives and negatives per HPI.  Past Medical History  Diagnosis Date  . Asthma   . Hyperlipidemia   . Allergy   . Umbilical hernia     Family History  Problem Relation Age of Onset  . Hypertension Mother   . Brain cancer Father   . Breast cancer Maternal Aunt     History   Social History  . Marital Status: Single    Spouse Name: N/A    Number of Children: N/A  . Years of Education: N/A   Social History Main Topics  . Smoking status: Never Smoker   . Smokeless tobacco: None  . Alcohol Use: No  . Drug Use: No  . Sexually Active: Yes   Other Topics Concern  . None   Social History Narrative   Regular exercise    Current outpatient prescriptions:aspirin 325 MG tablet, Take 325 mg by mouth as needed. , Disp: , Rfl: ;  clomiPHENE (CLOMID) 50 MG tablet, Take 0.5 tablets (25 mg total) by mouth 2 (two) times a week., Disp: 30 tablet, Rfl: 2;  ezetimibe (ZETIA) 10 MG tablet, Take 10 mg by mouth daily., Disp: , Rfl: ;  fish oil-omega-3 fatty acids 1000 MG capsule, Take 2 g by mouth daily.  , Disp: , Rfl:  fluticasone (FLONASE) 50 MCG/ACT nasal spray, Place 2 sprays into the nose daily., Disp: 16 g, Rfl: 1;  Multiple Vitamin (MULTIVITAMIN) capsule, Take 1 capsule by mouth daily.  , Disp: , Rfl:   EXAM:  Filed Vitals:   01/31/13 1509  BP: 110/82  Temp: 98.2 F (36.8 C)    Body mass index is 25.39 kg/(m^2).  GENERAL: vitals reviewed and listed above, alert, oriented, appears well hydrated and in no acute distress  HEENT: atraumatic, conjunttiva clear, no obvious abnormalities on inspection of external nose and  ears, normal appearance of ear canals and TMs, clear effusion bilat, clear nasal congestion with boggy turbinates, mild post oropharyngeal erythema with PND, no tonsillar edema or exudate, no sinus TTP  NECK: no obvious masses on inspection  LUNGS: clear to auscultation bilaterally, no wheezes, rales or rhonchi, good air movement  CV: HRRR, no peripheral edema  MS: moves all extremities without noticeable abnormality  PSYCH: pleasant and cooperative, no obvious depression or anxiety  ASSESSMENT AND PLAN:  Discussed the following assessment and plan:  Allergic rhinitis - Plan: fluticasone (FLONASE) 50 MCG/ACT nasal spray  Eustachian tube disorder, bilateral - Plan: fluticasone (FLONASE) 50 MCG/ACT nasal spray  -looks like AR on exam with clear effusion -  Likely eustachian tube dysfunction -advised NS and follow up with ENT if persists, numbers provided, return precuations -Patient advised to return or notify a doctor immediately if symptoms worsen or persist or new concerns arise.  There are no Patient Instructions on file for this visit.   Kriste Basque R.

## 2013-03-21 ENCOUNTER — Telehealth: Payer: Self-pay | Admitting: Internal Medicine

## 2013-03-21 NOTE — Telephone Encounter (Signed)
None avaiable

## 2013-03-21 NOTE — Telephone Encounter (Signed)
Pt would like samples of ezetimibe (ZETIA) 10 MG tablet Pt states you have given before.

## 2013-03-24 ENCOUNTER — Encounter: Payer: Self-pay | Admitting: Internal Medicine

## 2013-03-24 ENCOUNTER — Ambulatory Visit (INDEPENDENT_AMBULATORY_CARE_PROVIDER_SITE_OTHER): Payer: BC Managed Care – PPO | Admitting: Internal Medicine

## 2013-03-24 VITALS — BP 120/80 | HR 72 | Temp 98.3°F | Resp 16 | Ht 69.0 in | Wt 168.0 lb

## 2013-03-24 DIAGNOSIS — M7712 Lateral epicondylitis, left elbow: Secondary | ICD-10-CM

## 2013-03-24 DIAGNOSIS — S50311A Abrasion of right elbow, initial encounter: Secondary | ICD-10-CM

## 2013-03-24 DIAGNOSIS — N529 Male erectile dysfunction, unspecified: Secondary | ICD-10-CM

## 2013-03-24 DIAGNOSIS — E291 Testicular hypofunction: Secondary | ICD-10-CM

## 2013-03-24 DIAGNOSIS — M159 Polyosteoarthritis, unspecified: Secondary | ICD-10-CM

## 2013-03-24 DIAGNOSIS — IMO0002 Reserved for concepts with insufficient information to code with codable children: Secondary | ICD-10-CM

## 2013-03-24 DIAGNOSIS — M7710 Lateral epicondylitis, unspecified elbow: Secondary | ICD-10-CM

## 2013-03-24 MED ORDER — METHYLPREDNISOLONE ACETATE 40 MG/ML IJ SUSP: 40.0000 mg | Freq: Once | INTRAMUSCULAR | Status: DC

## 2013-03-24 NOTE — Progress Notes (Signed)
  Subjective:    Patient ID: Calvin Adams, male    DOB: 1960/08/31, 52 y.o.   MRN: [id number]  HPI Patient has developed polyarticular arthritis and has noted increased shoulder and joint pain Work Asthma Negative work up for auto immune arthritis    Review of Systems  Constitutional: Negative for fever and fatigue.  HENT: Negative for hearing loss, congestion, neck pain and postnasal drip.   Eyes: Negative for discharge, redness and visual disturbance.  Respiratory: Negative for cough, shortness of breath and wheezing.   Cardiovascular: Negative for leg swelling.  Gastrointestinal: Negative for abdominal pain, constipation and abdominal distention.  Genitourinary: Negative for urgency and frequency.  Musculoskeletal: Negative for joint swelling and arthralgias.  Skin: Negative for color change and rash.  Neurological: Negative for weakness and light-headedness.  Hematological: Negative for adenopathy.  Psychiatric/Behavioral: Negative for behavioral problems.       Objective:   Physical Exam  Constitutional: He appears well-developed and well-nourished.  HENT:  Head: Normocephalic and atraumatic.  Eyes: Conjunctivae are normal. Pupils are equal, round, and reactive to light.  Neck: Normal range of motion. Neck supple.  Cardiovascular: Normal rate and regular rhythm.   Pulmonary/Chest: Effort normal and breath sounds normal.  Abdominal: Soft. Bowel sounds are normal.          Assessment & Plan:  Moderate OA in multiple joints Clomid vs shot for hypogonadism  resume the shot   Left elbow was prepped with Betadine and injection with a half cc of 2% lidocaine and 40 mg of Depo-Medrol was injected into the lateral epicondyle

## 2013-04-18 ENCOUNTER — Telehealth: Payer: Self-pay | Admitting: Internal Medicine

## 2013-04-18 NOTE — Telephone Encounter (Signed)
Left message on machine None available

## 2013-04-18 NOTE — Telephone Encounter (Signed)
Pt needs samples of  zorvolex 35 mg and zetia 10 mg

## 2013-04-21 ENCOUNTER — Other Ambulatory Visit: Payer: Self-pay | Admitting: *Deleted

## 2013-04-21 MED ORDER — EZETIMIBE 10 MG PO TABS: 10.0000 mg | ORAL_TABLET | Freq: Every day | ORAL | Status: DC

## 2013-04-21 MED ORDER — DICLOFENAC 35 MG PO CAPS: 1.0000 | ORAL_CAPSULE | Freq: Two times a day (BID) | ORAL | Status: DC

## 2013-04-21 NOTE — Telephone Encounter (Deleted)
Pt called to request a full RX of this medication be called into [facility] on [address]. He would also like a refill of hisezetimibe (ZETIA) 10 MG tablet called in there as well. Please assist.

## 2013-04-21 NOTE — Telephone Encounter (Signed)
Done

## 2013-04-21 NOTE — Telephone Encounter (Signed)
Pt called to request a full RX of this medication be called into [facility] on [address]. He would also like a refill of hisezetimibe (ZETIA) 10 MG tablet called in there as well. Please assist.  °

## 2013-05-05 ENCOUNTER — Other Ambulatory Visit (INDEPENDENT_AMBULATORY_CARE_PROVIDER_SITE_OTHER): Payer: BC Managed Care – PPO

## 2013-05-05 DIAGNOSIS — E785 Hyperlipidemia, unspecified: Secondary | ICD-10-CM

## 2013-05-05 DIAGNOSIS — E291 Testicular hypofunction: Secondary | ICD-10-CM

## 2013-05-05 LAB — LIPID PANEL
Cholesterol: 191 mg/dL (ref 0–200)
HDL: 38.1 mg/dL — ABNORMAL LOW (ref >39.00)
Total CHOL/HDL Ratio: 5
VLDL: 50 mg/dL — ABNORMAL HIGH (ref 0.0–40.0)

## 2013-05-08 LAB — TESTOSTERONE, FREE, TOTAL, SHBG
Sex Hormone Binding: 27 nmol/L (ref 13–71)
Testosterone, Free: 83.1 pg/mL (ref 47.0–244.0)

## 2013-05-12 ENCOUNTER — Telehealth: Payer: Self-pay | Admitting: Internal Medicine

## 2013-05-12 NOTE — Telephone Encounter (Signed)
Pt would like results of testosterone level and tryglicerides. Pt would like to know today for med management. Ok to leave message.

## 2013-05-15 ENCOUNTER — Encounter: Payer: Self-pay | Admitting: *Deleted

## 2013-05-15 NOTE — Telephone Encounter (Signed)
The free testosterone is great so no need to change The elevated trigylcerides were the target of the zetia. If that has not worked conuld consider changing to tricor

## 2013-06-08 ENCOUNTER — Other Ambulatory Visit: Payer: Self-pay

## 2013-07-10 ENCOUNTER — Encounter: Payer: Self-pay | Admitting: Internal Medicine

## 2013-07-10 ENCOUNTER — Ambulatory Visit (INDEPENDENT_AMBULATORY_CARE_PROVIDER_SITE_OTHER): Payer: BC Managed Care – PPO | Admitting: Internal Medicine

## 2013-07-10 VITALS — BP 110/80 | HR 72 | Temp 98.2°F | Resp 16 | Ht 69.0 in | Wt 174.0 lb

## 2013-07-10 DIAGNOSIS — E781 Pure hyperglyceridemia: Secondary | ICD-10-CM

## 2013-07-10 DIAGNOSIS — E291 Testicular hypofunction: Secondary | ICD-10-CM

## 2013-07-10 DIAGNOSIS — Z0000 Encounter for general adult medical examination without abnormal findings: Secondary | ICD-10-CM

## 2013-07-10 MED ORDER — CLOMIPHENE CITRATE 50 MG PO TABS: 25.0000 mg | ORAL_TABLET | ORAL | Status: DC

## 2013-07-10 NOTE — Progress Notes (Signed)
   Subjective:    Patient ID: Calvin Adams, male    DOB: 1960/12/20, 52 y.o.   MRN: [id number]  Hyperlipidemia  off the zetia with lower TG Follow up for hypogonadism and ED Has cough when he lays down and  mucinex helps Some thick drainage for about 2-4 days    Review of Systems  Constitutional: Positive for activity change and fatigue.  HENT: Positive for postnasal drip.   Eyes: Negative.   Respiratory: Positive for cough.   Gastrointestinal: Negative.   Endocrine: Negative.   Genitourinary: Negative.   Musculoskeletal: Negative.   Allergic/Immunologic: Negative.   Neurological: Negative.   Hematological: Negative.    Past Medical History  Diagnosis Date  . Asthma   . Hyperlipidemia   . Allergy   . Umbilical hernia     History   Social History  . Marital Status: Single    Spouse Name: N/A    Number of Children: N/A  . Years of Education: N/A   Occupational History  . Not on file.   Social History Main Topics  . Smoking status: Never Smoker   . Smokeless tobacco: Not on file  . Alcohol Use: No  . Drug Use: No  . Sexual Activity: Yes   Other Topics Concern  . Not on file   Social History Narrative   Regular exercise    Past Surgical History  Procedure Laterality Date  . Spermaticeal removal      Family History  Problem Relation Age of Onset  . Hypertension Mother   . Brain cancer Father   . Breast cancer Maternal Aunt     No Known Allergies  Current Outpatient Prescriptions on File Prior to Visit  Medication Sig Dispense Refill  . aspirin 325 MG tablet Take 325 mg by mouth as needed.       . Diclofenac (ZORVOLEX) 35 MG CAPS Take 1 capsule by mouth 2 (two) times daily.  60 capsule  3  . fish oil-omega-3 fatty acids 1000 MG capsule Take 2 g by mouth daily.        . Multiple Vitamin (MULTIVITAMIN) capsule Take 1 capsule by mouth daily.        Marland Kitchen ezetimibe (ZETIA) 10 MG tablet Take 1 tablet (10 mg total) by mouth daily.  30 tablet  11   No  current facility-administered medications on file prior to visit.    BP 110/80  Pulse 72  Temp(Src) 98.2 F (36.8 C)  Resp 16  Ht 5\' 9"  (1.753 m)  Wt 174 lb (78.926 kg)  BMI 25.68 kg/m2       Objective:   Physical Exam  Constitutional: He appears well-developed and well-nourished.  HENT:  Head: Normocephalic and atraumatic.  Eyes: Conjunctivae are normal. Pupils are equal, round, and reactive to light.  Neck: Normal range of motion. Neck supple.  Cardiovascular: Normal rate and regular rhythm.   Pulmonary/Chest: Effort normal and breath sounds normal.  Abdominal: Soft. Bowel sounds are normal.          Assessment & Plan:  Now using 1 clomid twice a week  Change  To .5 every other day

## 2013-07-10 NOTE — Patient Instructions (Signed)
Use the saline nasal wash at least twice a day

## 2013-07-10 NOTE — Progress Notes (Signed)
Pre visit review using our clinic review tool, if applicable. No additional management support is needed unless otherwise documented below in the visit note. 

## 2013-07-11 ENCOUNTER — Encounter: Payer: Self-pay | Admitting: Internal Medicine

## 2013-08-08 ENCOUNTER — Ambulatory Visit (AMBULATORY_SURGERY_CENTER): Payer: Self-pay

## 2013-08-08 VITALS — Ht 69.0 in | Wt 174.4 lb

## 2013-08-08 DIAGNOSIS — Z1211 Encounter for screening for malignant neoplasm of colon: Secondary | ICD-10-CM

## 2013-08-08 MED ORDER — MOVIPREP 100 G PO SOLR: ORAL | Status: DC

## 2013-08-10 ENCOUNTER — Encounter: Payer: Self-pay | Admitting: Internal Medicine

## 2013-08-21 ENCOUNTER — Encounter: Payer: BC Managed Care – PPO | Admitting: Internal Medicine

## 2013-10-20 ENCOUNTER — Telehealth: Payer: Self-pay | Admitting: Internal Medicine

## 2013-10-20 NOTE — Telephone Encounter (Signed)
Pt states his right elbow is hurting again and would like to get another injection. Please advise if it is okay to schedule.

## 2013-10-20 NOTE — Telephone Encounter (Signed)
Per Dr Jenkins schedule with [facility] 

## 2013-10-20 NOTE — Telephone Encounter (Signed)
Called pt and offered an appointment with [facility] but pt wanted to come in today (10/20/13) due to the recovery time.  He said he will callback to schedule the appointment.

## 2013-10-23 ENCOUNTER — Encounter: Payer: Self-pay | Admitting: Family

## 2013-10-23 ENCOUNTER — Ambulatory Visit (INDEPENDENT_AMBULATORY_CARE_PROVIDER_SITE_OTHER): Payer: BC Managed Care – PPO | Admitting: Family

## 2013-10-23 VITALS — BP 124/88 | HR 78 | Wt 177.0 lb

## 2013-10-23 DIAGNOSIS — M25529 Pain in unspecified elbow: Secondary | ICD-10-CM

## 2013-10-23 DIAGNOSIS — M25522 Pain in left elbow: Secondary | ICD-10-CM

## 2013-10-23 MED ORDER — METHYLPREDNISOLONE ACETATE 80 MG/ML IJ SUSP: 40.0000 mg | Freq: Once | INTRAMUSCULAR | Status: DC

## 2013-10-23 NOTE — Progress Notes (Signed)
Subjective:    Patient ID: Calvin Adams, male    DOB: 05-21-1961, 53 y.o.   MRN: [id number]  HPI 53 y.o. White male presents today with chief complaint of "left elbow pain". Pt states he has had similar pain before to both elbows and has had cortisone injections to both elbows which are effective. Pt rates left elbow pain as a 6 on a scale of 0-10, he states the pain is intermitten and worse when lifting things, the pain does not radiate, pt has not taken any medication or used ice to relieve pain. Pt denies fever, fatigue, chills and change in appetite.     Review of Systems  Constitutional: Negative.   HENT: Negative.   Eyes: Negative.   Respiratory: Negative.   Cardiovascular: Negative.   Gastrointestinal: Negative.   Endocrine: Negative.   Genitourinary: Negative.   Musculoskeletal: Positive for arthralgias and joint swelling.       To left elbow  Skin: Negative.   Neurological: Negative.   Hematological: Negative.   Psychiatric/Behavioral: Negative.        Past Medical History  Diagnosis Date  . Hyperlipidemia   . Allergy   . Umbilical hernia   . Arthritis     History   Social History  . Marital Status: Single    Spouse Name: N/A    Number of Children: N/A  . Years of Education: N/A   Occupational History  . Not on file.   Social History Main Topics  . Smoking status: Never Smoker   . Smokeless tobacco: Never Used  . Alcohol Use: No  . Drug Use: No  . Sexual Activity: Yes   Other Topics Concern  . Not on file   Social History Narrative   Regular exercise    Past Surgical History  Procedure Laterality Date  . Spermaticeal removal    . Umbilical hernia repair      Family History  Problem Relation Age of Onset  . Hypertension Mother   . Brain cancer Father   . Breast cancer Maternal Aunt   . Diabetes Sister     Allergies  Allergen Reactions  . Latex     Itching and redness    Current Outpatient Prescriptions on File Prior to Visit    Medication Sig Dispense Refill  . clomiPHENE (CLOMID) 50 MG tablet Take 0.5 tablets (25 mg total) by mouth every other day.  30 tablet  2  . Multiple Vitamin (MULTIVITAMIN) capsule Take 1 capsule by mouth daily.        Marland Kitchen aspirin 325 MG tablet Take 325 mg by mouth as needed.       . Diclofenac 35 MG CAPS Take 1 capsule by mouth as needed.      . ezetimibe (ZETIA) 10 MG tablet Take 1 tablet (10 mg total) by mouth daily.  30 tablet  11  . fish oil-omega-3 fatty acids 1000 MG capsule Take 2 g by mouth daily.        Marland Kitchen MOVIPREP 100 G SOLR Moviprep as directed / no substitutions  1 kit  0  . Red Yeast Rice Extract (RED YEAST RICE PO) Take by mouth daily.      . vitamin C (ASCORBIC ACID) 500 MG tablet Take 500 mg by mouth daily.       No current facility-administered medications on file prior to visit.    BP 124/88  Pulse 78  Wt 177 lb (80.287 kg)chart Objective:   Physical Exam  Constitutional:  He is oriented to person, place, and time. He appears well-developed and well-nourished. He is active.  Cardiovascular: Normal rate, regular rhythm and normal heart sounds.   Pulmonary/Chest: Effort normal and breath sounds normal.  Musculoskeletal:  Decreased strength against resistance to left elbow.   Neurological: He is alert and oriented to person, place, and time.  Skin: Skin is warm, dry and intact.  Psychiatric: He has a normal mood and affect. His speech is normal and behavior is normal. Thought content normal.          Assessment & Plan:  53 y.o. White male presents with chief complaint of "left elbow pain".  - Left Elbow Pain: Depomedrol injection 48m intra-articular to left elbow.   Education: Apply ice to elbow to help reduce pain and swelling. Take anti-inflammatory such as Ibuprofen for pain. Best results of steroid injection are seen at 72 hours.   Follow up: as needed.

## 2013-10-23 NOTE — Progress Notes (Signed)
Pre visit review using our clinic review tool, if applicable. No additional management support is needed unless otherwise documented below in the visit note. 

## 2013-10-27 ENCOUNTER — Encounter: Payer: BC Managed Care – PPO | Admitting: Internal Medicine

## 2013-11-13 ENCOUNTER — Encounter: Payer: BC Managed Care – PPO | Admitting: Internal Medicine

## 2013-11-13 ENCOUNTER — Other Ambulatory Visit (INDEPENDENT_AMBULATORY_CARE_PROVIDER_SITE_OTHER): Payer: BC Managed Care – PPO

## 2013-11-13 DIAGNOSIS — E781 Pure hyperglyceridemia: Secondary | ICD-10-CM

## 2013-11-13 DIAGNOSIS — E291 Testicular hypofunction: Secondary | ICD-10-CM

## 2013-11-13 LAB — LIPID PANEL
CHOL/HDL RATIO: 5
CHOLESTEROL: 173 mg/dL (ref 0–200)
HDL: 35.3 mg/dL — AB (ref >39.00)
LDL CALC: 83 mg/dL (ref 0–99)
TRIGLYCERIDES: 272 mg/dL — AB (ref 0.0–149.0)
VLDL: 54.4 mg/dL — AB (ref 0.0–40.0)

## 2013-11-14 LAB — TESTOSTERONE, FREE, TOTAL, SHBG
Sex Hormone Binding: 36 nmol/L (ref 13–71)
Testosterone, Free: 81.5 pg/mL (ref 47.0–244.0)
Testosterone-% Free: 1.9 % (ref 1.6–2.9)
Testosterone: 420 ng/dL (ref 300–890)

## 2013-11-20 ENCOUNTER — Encounter: Payer: Self-pay | Admitting: Internal Medicine

## 2013-11-20 ENCOUNTER — Ambulatory Visit (INDEPENDENT_AMBULATORY_CARE_PROVIDER_SITE_OTHER): Payer: BC Managed Care – PPO | Admitting: Internal Medicine

## 2013-11-20 VITALS — BP 102/62 | HR 60 | Temp 98.0°F | Ht 69.0 in | Wt 175.0 lb

## 2013-11-20 DIAGNOSIS — M19012 Primary osteoarthritis, left shoulder: Secondary | ICD-10-CM

## 2013-11-20 DIAGNOSIS — E291 Testicular hypofunction: Secondary | ICD-10-CM

## 2013-11-20 DIAGNOSIS — M19019 Primary osteoarthritis, unspecified shoulder: Secondary | ICD-10-CM

## 2013-11-20 MED ORDER — DICLOFENAC SODIUM 75 MG PO TBEC: 75.0000 mg | DELAYED_RELEASE_TABLET | Freq: Two times a day (BID) | ORAL | Status: DC

## 2013-11-20 MED ORDER — TESTOSTERONE CYPIONATE 200 MG/ML IM SOLN: 50.0000 mg | INTRAMUSCULAR | Status: DC

## 2013-11-20 NOTE — Progress Notes (Signed)
   Subjective:    Patient ID: Calvin Adams, male    DOB: Aug 13, 1960, 53 y.o.   MRN: [id number]  HPI  Follow up for testosterone and TG testosterone is 420  Free is 81  TG is elevated  Lack of sex drive and body mass gain  Review of Systems  Constitutional: Negative for fever and fatigue.  HENT: Negative for congestion, hearing loss and postnasal drip.   Eyes: Negative for discharge, redness and visual disturbance.  Respiratory: Negative for cough, shortness of breath and wheezing.   Cardiovascular: Negative for leg swelling.  Gastrointestinal: Negative for abdominal pain, constipation and abdominal distention.  Genitourinary: Negative for urgency and frequency.  Musculoskeletal: Negative for arthralgias, joint swelling and neck pain.  Skin: Negative for color change and rash.  Neurological: Negative for weakness and light-headedness.  Hematological: Negative for adenopathy.  Psychiatric/Behavioral: Negative for behavioral problems.       Objective:   Physical Exam  Constitutional: He is oriented to person, place, and time. He appears well-developed and well-nourished.  HENT:  Head: Normocephalic and atraumatic.  Eyes: Conjunctivae are normal. Pupils are equal, round, and reactive to light.  Cardiovascular: Normal rate and regular rhythm.   Abdominal: Soft. Bowel sounds are normal.  Neurological: He is alert and oriented to person, place, and time.  Skin: Skin is warm and dry.          Assessment & Plan:  Discussion of risk Want  Ok resume injections should pain responded to the  diclofinac Wants generic

## 2013-11-20 NOTE — Progress Notes (Signed)
Pre visit review using our clinic review tool, if applicable. No additional management support is needed unless otherwise documented below in the visit note. 

## 2013-12-07 ENCOUNTER — Encounter: Payer: Self-pay | Admitting: Internal Medicine

## 2013-12-11 ENCOUNTER — Encounter: Payer: BC Managed Care – PPO | Admitting: Internal Medicine

## 2014-01-16 ENCOUNTER — Telehealth: Payer: Self-pay | Admitting: Internal Medicine

## 2014-01-16 NOTE — Telephone Encounter (Signed)
Lmovm for pt to call back for an appt to be est  with Dr Yong Channel

## 2014-01-19 NOTE — Telephone Encounter (Signed)
Pt has been scheduled.  °

## 2014-01-22 ENCOUNTER — Ambulatory Visit (AMBULATORY_SURGERY_CENTER): Payer: Self-pay | Admitting: *Deleted

## 2014-01-22 VITALS — Ht 69.0 in | Wt 172.2 lb

## 2014-01-22 DIAGNOSIS — Z1211 Encounter for screening for malignant neoplasm of colon: Secondary | ICD-10-CM

## 2014-01-22 NOTE — Progress Notes (Signed)
No allergies to eggs or soy. No problems with anesthesia.  Pt given Emmi instructions for colonoscopy  No oxygen use  No diet drug use  Pt has Moviprep at home from previously scheduled procedure

## 2014-02-01 ENCOUNTER — Encounter: Payer: Self-pay | Admitting: Internal Medicine

## 2014-02-01 ENCOUNTER — Encounter: Payer: BC Managed Care – PPO | Admitting: Internal Medicine

## 2014-02-01 ENCOUNTER — Ambulatory Visit (AMBULATORY_SURGERY_CENTER): Payer: BC Managed Care – PPO | Admitting: Internal Medicine

## 2014-02-01 VITALS — BP 111/44 | HR 58 | Temp 97.5°F | Resp 14 | Ht 69.0 in | Wt 172.0 lb

## 2014-02-01 DIAGNOSIS — Z1211 Encounter for screening for malignant neoplasm of colon: Secondary | ICD-10-CM

## 2014-02-01 MED ORDER — SODIUM CHLORIDE 0.9 % IV SOLN: 500.0000 mL | INTRAVENOUS | Status: DC

## 2014-02-01 NOTE — Progress Notes (Signed)
A/ox3, pleased with MAC, report to RN 

## 2014-02-01 NOTE — Op Note (Signed)
[facility]  [address] [place], [id number]   COLONOSCOPY PROCEDURE REPORT  PATIENT: Calvin, Adams  MR#: [id number] BIRTHDATE: [birth year] , 53  yrs. old GENDER: Male ENDOSCOPIST: Eustace Quail, MD REFERRED XY:IAXK Vear Clock, M.D. PROCEDURE DATE:  02/01/2014 PROCEDURE:   Colonoscopy, screening First Screening Colonoscopy - Avg.  risk and is 50 yrs.  old or older Yes.  Prior Negative Screening - Now for repeat screening. N/A  History of Adenoma - Now for follow-up colonoscopy & has been > or = to 3 yrs.  N/A  Polyps Removed Today? No.  Recommend repeat exam, <10 yrs? No. ASA CLASS:   Class I INDICATIONS:average risk screening. MEDICATIONS: MAC sedation, administered by CRNA and propofol (Diprivan) 200mg  IV  DESCRIPTION OF PROCEDURE:   After the risks benefits and alternatives of the procedure were thoroughly explained, informed consent was obtained.  A digital rectal exam revealed no abnormalities of the rectum.   The LB [id number]  endoscope was introduced through the anus and advanced to the cecum, which was identified by both the appendix and ileocecal valve. No adverse events experienced.   The quality of the prep was excellent, using MoviPrep  The instrument was then slowly withdrawn as the colon was fully examined.  COLON FINDINGS: Diverticulosis was noted (a few scatterred diverticula).   The colon was otherwise normal.  There was no inflammation, polyps or cancers unless previously stated. Retroflexed views revealed internal hemorrhoids. The time to cecum=3 minutes 35 seconds.  Withdrawal time=8 minutes 55 seconds. The scope was withdrawn and the procedure completed. COMPLICATIONS: There were no complications.  ENDOSCOPIC IMPRESSION: 1.   Diverticulosis (mild) 2.   The colon was otherwise normal  RECOMMENDATIONS: 1. Continue current colorectal screening recommendations for "routine risk" patients with a repeat colonoscopy in 10  years.   eSigned:  Eustace Quail, MD 02/01/2014 10:30 AM   cc: Ricard Dillon, MD and The Patient

## 2014-02-01 NOTE — Patient Instructions (Signed)
YOU HAD AN ENDOSCOPIC PROCEDURE TODAY AT THE [facility]: Refer to the procedure report that was given to you for any specific questions about what was found during the examination.  If the procedure report does not answer your questions, please call your gastroenterologist to clarify.  If you requested that your care partner not be given the details of your procedure findings, then the procedure report has been included in a sealed envelope for you to review at your convenience later.  YOU SHOULD EXPECT: Some feelings of bloating in the abdomen. Passage of more gas than usual.  Walking can help get rid of the air that was put into your GI tract during the procedure and reduce the bloating. If you had a lower endoscopy (such as a colonoscopy or flexible sigmoidoscopy) you may notice spotting of blood in your stool or on the toilet paper. If you underwent a bowel prep for your procedure, then you may not have a normal bowel movement for a few days.  DIET: Your first meal following the procedure should be a light meal and then it is ok to progress to your normal diet.  A half-sandwich or bowl of soup is an example of a good first meal.  Heavy or fried foods are harder to digest and may make you feel nauseous or bloated.  Likewise meals heavy in dairy and vegetables can cause extra gas to form and this can also increase the bloating.  Drink plenty of fluids but you should avoid alcoholic beverages for 24 hours.  ACTIVITY: Your care partner should take you home directly after the procedure.  You should plan to take it easy, moving slowly for the rest of the day.  You can resume normal activity the day after the procedure however you should NOT DRIVE or use heavy machinery for 24 hours (because of the sedation medicines used during the test).    SYMPTOMS TO REPORT IMMEDIATELY: A gastroenterologist can be reached at any hour.  During normal business hours, 8:30 AM to 5:00 PM Monday through Friday,  call [phone].  After hours and on weekends, please call the GI answering service at [phone] who will take a message and have the physician on call contact you.   Following lower endoscopy (colonoscopy or flexible sigmoidoscopy):  Excessive amounts of blood in the stool  Significant tenderness or worsening of abdominal pains  Swelling of the abdomen that is new, acute  Fever of 100F or higher    FOLLOW UP: If any biopsies were taken you will be contacted by phone or by letter within the next 1-3 weeks.  Call your gastroenterologist if you have not heard about the biopsies in 3 weeks.  Our staff will call the home number listed on your records the next business day following your procedure to check on you and address any questions or concerns that you may have at that time regarding the information given to you following your procedure. This is a courtesy call and so if there is no answer at the home number and we have not heard from you through the emergency physician on call, we will assume that you have returned to your regular daily activities without incident.  SIGNATURES/CONFIDENTIALITY: You and/or your care partner have signed paperwork which will be entered into your electronic medical record.  These signatures attest to the fact that that the information above on your After Visit Summary has been reviewed and is understood.  Full responsibility of the confidentiality   of this discharge information lies with you and/or your care-partner.   INFORMATION ON DIVERTICULOSIS GIVEN TO YOU TODAY

## 2014-02-05 ENCOUNTER — Telehealth: Payer: Self-pay | Admitting: *Deleted

## 2014-02-05 NOTE — Telephone Encounter (Signed)
No answer, message left for the patient. 

## 2014-03-13 ENCOUNTER — Ambulatory Visit: Payer: BC Managed Care – PPO | Admitting: Family Medicine

## 2014-03-14 ENCOUNTER — Encounter: Payer: Self-pay | Admitting: Family Medicine

## 2014-03-14 ENCOUNTER — Ambulatory Visit (INDEPENDENT_AMBULATORY_CARE_PROVIDER_SITE_OTHER): Payer: BC Managed Care – PPO | Admitting: Family Medicine

## 2014-03-14 VITALS — BP 110/70 | HR 80 | Temp 97.8°F | Ht 68.0 in | Wt 170.0 lb

## 2014-03-14 DIAGNOSIS — E785 Hyperlipidemia, unspecified: Secondary | ICD-10-CM

## 2014-03-14 DIAGNOSIS — M159 Polyosteoarthritis, unspecified: Secondary | ICD-10-CM

## 2014-03-14 DIAGNOSIS — Z23 Encounter for immunization: Secondary | ICD-10-CM

## 2014-03-14 DIAGNOSIS — E291 Testicular hypofunction: Secondary | ICD-10-CM

## 2014-03-14 NOTE — Assessment & Plan Note (Signed)
Seems to be doing reasonably well off therapy. Check testosterone levels today. We discussed benefits and risks of testosterone therapy as well as Clomid.

## 2014-03-14 NOTE — Assessment & Plan Note (Signed)
Discussed use of statin given triglycerides between 200-500. Patient would prefer to restart red yeast rice. Check lipids at next visit.

## 2014-03-14 NOTE — Progress Notes (Signed)
Calvin Reddish, MD Phone: [phone]  Subjective:  Patient presents today to establish care. Chief complaint-noted.   Osteoarthritis Joint aches and pains. Right shoulder and left shoulder primarily. Left elbow with history tennis elbow Taking diclofenac and pretty reasonable relief. Takes as needed 3 to 4 times a week.  ROS-denies redness or swelling of joints  Testicular hypofunction Pretty severe fatigue in 40s. Diagnosed as hypogonadal. Has used both testosterone injections as well as clomid. Tries to take breaks between therapy as. concern of side effects. Levels were not as high recently on Clomid compared to testosterone where he got closer to 300 or 400. No therapy in 2 months with last therapy being clomid, year since last injection. Patient states off therapy that his workouts and fatigue have not significantly worsened.  Hypertriglyceridemia. Up to 620 in past and 272 with red yeast rice. Patient plans to restart.  ROS-no chest pain or shortness of breath. No epigastric abdominal pain.  The following were reviewed and entered/updated in epic: Past Medical History  Diagnosis Date  . Hyperlipidemia   . Allergy   . Umbilical hernia   . Arthritis    Patient Active Problem List   Diagnosis Date Noted  . TESTICULAR HYPOFUNCTION 07/29/2009    Priority: Medium  . HERPES, GENITAL NOS 05/30/2007    Priority: Medium  . HYPERLIPIDEMIA 05/30/2007    Priority: Medium  . ASTHMA 05/30/2007    Priority: Medium  . 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 Hx of umbilical hernia repair 14/48/1856    Priority: Low  . SHOULDER IMPINGEMENT SYNDROME, RIGHT 12/16/2009    Priority: Low  . BACK PAIN 11/29/2009    Priority: Low  . ERECTILE DYSFUNCTION, ORGANIC 04/24/2009    Priority: Low  . ARTHRITIS, RIGHT FOOT 04/16/2008    Priority: Low  . ALLERGIC RHINITIS 05/30/2007    Priority: Low   Past Surgical History  Procedure Laterality Date  . Spermaticeal  removal      in [place], [place]  . Umbilical hernia repair  2013    Family History  Problem Relation Age of Onset  . Hypertension Mother   . Brain cancer Father     brain tumor, died before patient was born  . Breast cancer Maternal Aunt     several aunts  . Diabetes Sister   . Colon cancer Neg Hx   . Esophageal cancer Neg Hx   . Rectal cancer Neg Hx   . Stomach cancer Neg Hx     Medications- reviewed and updated Current Outpatient Prescriptions  Medication Sig Dispense Refill  . diclofenac (VOLTAREN) 75 MG EC tablet Take 1 tablet (75 mg total) by mouth 2 (two) times daily.  60 tablet  5  . fish oil-omega-3 fatty acids 1000 MG capsule Take 2 g by mouth daily.        . Multiple Vitamin (MULTIVITAMIN) capsule Take 1 capsule by mouth daily.        . vitamin C (ASCORBIC ACID) 500 MG tablet Take 500 mg by mouth daily.      . clomiPHENE (CLOMID) 50 MG tablet 50 mg. Takes one half tablet every other day      . Red Yeast Rice Extract (RED YEAST RICE PO) Take by mouth daily.       No current facility-administered medications for this visit.    Allergies-reviewed and updated Allergies  Allergen Reactions  . Latex     Itching and redness    History   Social  History  . Marital Status: Single    Spouse Name: N/A    Number of Children: N/A  . Years of Education: N/A   Social History Main Topics  . Smoking status: Never Smoker   . Smokeless tobacco: Never Used  . Alcohol Use: Yes     Comment: rare usage  . Drug Use: No  . Sexual Activity: No   Other Topics Concern  . None   Social History Narrative   Regular exercise      From [place], [place]. Moved [place] in 2002.    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    Lives alone. Single. 2 brothers and a sister    Hobbies: working in yard, gardening, working out    ROS--See HPI   Objective: BP 110/70  Pulse 80  Temp(Src) 97.8 F (36.6 C)  Ht 5\' 8"  (1.727 m)  Wt 170 lb (77.111 kg)  BMI 25.85 kg/m2 Gen: NAD, resting  comfortably HEENT: Mucous membranes are moist. Oropharynx normal Neck: no thyromegaly CV: RRR no murmurs rubs or gallops Lungs: CTAB no crackles, wheeze, rhonchi Abdomen: soft/nontender/nondistended/normal bowel sounds. No rebound or guarding.  Ext: no edema Skin: warm, dry Neuro: grossly normal, moves all extremities, PERRLA  Assessment/Plan:  TESTICULAR HYPOFUNCTION Seems to be doing reasonably well off therapy. Check testosterone levels today. We discussed benefits and risks of testosterone therapy as well as Clomid.  HYPERLIPIDEMIA Discussed use of statin given triglycerides between 200-500. Patient would prefer to restart red yeast rice. Check lipids at next visit.  Polyarticular osteoarthritis Well controlled with when necessary use of Voltaren.   discussed STD testing. Patient not active in last 3 years. He prefers to not have this testing this time.  Orders Placed This Encounter  Procedures  . Tdap vaccine greater than or equal to 7yo IM  . Testosterone, Free, Total

## 2014-03-14 NOTE — Assessment & Plan Note (Signed)
Well controlled with when necessary use of Voltaren.

## 2014-03-14 NOTE — Patient Instructions (Addendum)
Glad things are going well with the diclofenac.  I would restart the red yeast rice.  Let's check testosterone today as it has been 2 months off therapy.  Next available for physical  Health Maintenance Due  Topic Date Due  . Tetanus/tdap - today 08/03/2013  . Influenza Vaccine  03/03/2014

## 2014-03-15 LAB — TESTOSTERONE, FREE, TOTAL, SHBG
SEX HORMONE BINDING: 27 nmol/L (ref 13–71)
Testosterone, Free: 80 pg/mL (ref 47.0–244.0)
Testosterone-% Free: 2.2 % (ref 1.6–2.9)
Testosterone: 357 ng/dL (ref 300–890)

## 2014-07-31 ENCOUNTER — Other Ambulatory Visit (INDEPENDENT_AMBULATORY_CARE_PROVIDER_SITE_OTHER): Payer: BC Managed Care – PPO

## 2014-07-31 DIAGNOSIS — E291 Testicular hypofunction: Secondary | ICD-10-CM

## 2014-07-31 DIAGNOSIS — Z0000 Encounter for general adult medical examination without abnormal findings: Secondary | ICD-10-CM

## 2014-07-31 DIAGNOSIS — E349 Endocrine disorder, unspecified: Secondary | ICD-10-CM

## 2014-07-31 DIAGNOSIS — E785 Hyperlipidemia, unspecified: Secondary | ICD-10-CM

## 2014-07-31 LAB — HEPATIC FUNCTION PANEL
ALBUMIN: 4 g/dL (ref 3.5–5.2)
ALT: 58 U/L — ABNORMAL HIGH (ref 0–53)
AST: 30 U/L (ref 0–37)
Alkaline Phosphatase: 60 U/L (ref 39–117)
Bilirubin, Direct: 0.1 mg/dL (ref 0.0–0.3)
Total Bilirubin: 0.9 mg/dL (ref 0.2–1.2)
Total Protein: 6 g/dL (ref 6.0–8.3)

## 2014-07-31 LAB — CBC WITH DIFFERENTIAL/PLATELET
BASOS ABS: 0 10*3/uL (ref 0.0–0.1)
Basophils Relative: 0.3 % (ref 0.0–3.0)
EOS ABS: 0.1 10*3/uL (ref 0.0–0.7)
Eosinophils Relative: 3.1 % (ref 0.0–5.0)
HCT: 44.8 % (ref 39.0–52.0)
Hemoglobin: 15.2 g/dL (ref 13.0–17.0)
Lymphocytes Relative: 18 % (ref 12.0–46.0)
Lymphs Abs: 0.8 10*3/uL (ref 0.7–4.0)
MCHC: 33.9 g/dL (ref 30.0–36.0)
MCV: 91.8 fl (ref 78.0–100.0)
MONOS PCT: 12 % (ref 3.0–12.0)
Monocytes Absolute: 0.5 10*3/uL (ref 0.1–1.0)
NEUTROS PCT: 66.6 % (ref 43.0–77.0)
Neutro Abs: 3 10*3/uL (ref 1.4–7.7)
Platelets: 169 10*3/uL (ref 150.0–400.0)
RBC: 4.88 Mil/uL (ref 4.22–5.81)
RDW: 12.6 % (ref 11.5–15.5)
WBC: 4.5 10*3/uL (ref 4.0–10.5)

## 2014-07-31 LAB — POCT URINALYSIS DIPSTICK
Bilirubin, UA: NEGATIVE
Blood, UA: NEGATIVE
Glucose, UA: NEGATIVE
Ketones, UA: NEGATIVE
LEUKOCYTES UA: NEGATIVE
NITRITE UA: NEGATIVE
PROTEIN UA: NEGATIVE
Spec Grav, UA: 1.015
UROBILINOGEN UA: 0.2
pH, UA: 6.5

## 2014-07-31 LAB — LIPID PANEL
Cholesterol: 237 mg/dL — ABNORMAL HIGH (ref 0–200)
HDL: 29.4 mg/dL — AB (ref >39.00)
NonHDL: 207.6
Total CHOL/HDL Ratio: 8
Triglycerides: 975 mg/dL — ABNORMAL HIGH (ref 0.0–149.0)
VLDL: 195 mg/dL — ABNORMAL HIGH (ref 0.0–40.0)

## 2014-07-31 LAB — BASIC METABOLIC PANEL
BUN: 11 mg/dL (ref 6–23)
CALCIUM: 8.8 mg/dL (ref 8.4–10.5)
CO2: 29 mEq/L (ref 19–32)
CREATININE: 0.9 mg/dL (ref 0.4–1.5)
Chloride: 105 mEq/L (ref 96–112)
GFR: 90.78 mL/min (ref >60.00)
Glucose, Bld: 85 mg/dL (ref 70–99)
Potassium: 4.7 mEq/L (ref 3.5–5.1)
Sodium: 140 mEq/L (ref 135–145)

## 2014-07-31 LAB — TSH: TSH: 3.42 u[IU]/mL (ref 0.35–4.50)

## 2014-07-31 LAB — TESTOSTERONE: Testosterone: 214.38 ng/dL — ABNORMAL LOW (ref 300.00–890.00)

## 2014-07-31 LAB — PSA: PSA: 1.72 ng/mL (ref 0.10–4.00)

## 2014-07-31 LAB — LDL CHOLESTEROL, DIRECT: Direct LDL: 73.6 mg/dL

## 2014-08-07 ENCOUNTER — Encounter: Payer: Self-pay | Admitting: Family Medicine

## 2014-08-07 ENCOUNTER — Ambulatory Visit (INDEPENDENT_AMBULATORY_CARE_PROVIDER_SITE_OTHER): Payer: 59 | Admitting: Family Medicine

## 2014-08-07 VITALS — BP 90/60 | HR 64 | Temp 98.0°F | Ht 69.0 in | Wt 176.0 lb

## 2014-08-07 DIAGNOSIS — E785 Hyperlipidemia, unspecified: Secondary | ICD-10-CM

## 2014-08-07 DIAGNOSIS — IMO0001 Reserved for inherently not codable concepts without codable children: Secondary | ICD-10-CM

## 2014-08-07 DIAGNOSIS — Z0000 Encounter for general adult medical examination without abnormal findings: Secondary | ICD-10-CM

## 2014-08-07 DIAGNOSIS — E291 Testicular hypofunction: Secondary | ICD-10-CM

## 2014-08-07 MED ORDER — CLOMIPHENE CITRATE 50 MG PO TABS: ORAL_TABLET | ORAL | Status: DC

## 2014-08-07 MED ORDER — FENOFIBRATE 145 MG PO TABS: 145.0000 mg | ORAL_TABLET | Freq: Every day | ORAL | Status: DC

## 2014-08-07 NOTE — Patient Instructions (Signed)
Declined flu.   See me back in 3 months-try to up the exercise, cut down on the sweets and fats. If triglycerides over 500, need to consider medicine such as a fibrate. Restarting fish oil may help or krill oil OTC. If between 200-500 we need to consider a statin medicine such as lipitor.   You can restart your clomid. We can see if levels are better at follow up.

## 2014-08-07 NOTE — Progress Notes (Signed)
Calvin Reddish, MD Phone: [phone]  Subjective:  Patient presents today for annual physical. Chief complaint-noted.   He is exercising regularly. Declines STD testing and only does home testing-discussed that would prefer in office but he declines. Has noted fatigue worsening off of clomid and more trouble a tthe gym but still doing 150 minutes per week. For lipids-he is taking red yeast rice but has stopped fish oil which he states has note helped him much in the past. Still takes multivitamin. Thinks he ate terribly over the holidays and that is likely reason for increased triglycerides. No recent genital herpes outbreaks but has had some cold sores and requests samples which I informed him we do not have anymore.   ROS- Full ROS completed and only positives were fatigue, decreased lifting endurance.   The following were reviewed and entered/updated in epic: Past Medical History  Diagnosis Date  . Hyperlipidemia   . Allergy   . Umbilical hernia   . Arthritis    Patient Active Problem List   Diagnosis Date Noted  . TESTICULAR HYPOFUNCTION 07/29/2009    Priority: Medium  . HERPES, GENITAL NOS 05/30/2007    Priority: Medium  . HYPERLIPIDEMIA 05/30/2007    Priority: Medium  . ASTHMA 05/30/2007    Priority: Medium  . 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 Hx of umbilical hernia repair 38/25/0539    Priority: Low  . SHOULDER IMPINGEMENT SYNDROME, RIGHT 12/16/2009    Priority: Low  . BACK PAIN 11/29/2009    Priority: Low  . ERECTILE DYSFUNCTION, ORGANIC 04/24/2009    Priority: Low  . ARTHRITIS, RIGHT FOOT 04/16/2008    Priority: Low  . ALLERGIC RHINITIS 05/30/2007    Priority: Low   Past Surgical History  Procedure Laterality Date  . Spermaticeal removal      in [place], [place]  . Umbilical hernia repair  2013    Family History  Problem Relation Age of Onset  . Hypertension Mother   . Brain cancer Father     brain tumor, died before patient  was born  . Breast cancer Maternal Aunt     several aunts  . Diabetes Sister   . Colon cancer Neg Hx   . Esophageal cancer Neg Hx   . Rectal cancer Neg Hx   . Stomach cancer Neg Hx     Medications- reviewed and updated Current Outpatient Prescriptions  Medication Sig Dispense Refill  . clomiPHENE (CLOMID) 50 MG tablet 50 mg. Takes one half tablet every other day    . fish oil-omega-3 fatty acids 1000 MG capsule Take 2 g by mouth daily.      . Multiple Vitamin (MULTIVITAMIN) capsule Take 1 capsule by mouth daily.      . Red Yeast Rice Extract (RED YEAST RICE PO) Take by mouth daily.    . vitamin C (ASCORBIC ACID) 500 MG tablet Take 500 mg by mouth daily.     Allergies-reviewed and updated Allergies  Allergen Reactions  . Latex     Itching and redness    History   Social History  . Marital Status: Single    Spouse Name: N/A    Number of Children: N/A  . Years of Education: N/A   Social History Main Topics  . Smoking status: Never Smoker   . Smokeless tobacco: Never Used  . Alcohol Use: Yes     Comment: rare usage  . Drug Use: No  . Sexual Activity: No   Other Topics Concern  .  None   Social History Narrative   Regular exercise      From [place], [place]. Moved [place] in 2002.    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    Lives alone. Single. 2 brothers and a sister    Hobbies: working in yard, gardening, working out    ROS--See HPI   Objective: BP 90/60 mmHg  Pulse 64  Temp(Src) 98 F (36.7 C)  Ht 5\' 9"  (1.753 m)  Wt 176 lb (79.833 kg)  BMI 25.98 kg/m2 Gen: NAD, resting comfortably HEENT: Mucous membranes are moist. Oropharynx normal. Good dentition.  Neck: no thyromegaly CV: RRR no murmurs rubs or gallops Lungs: CTAB no crackles, wheeze, rhonchi Abdomen: soft/nontender/nondistended/normal bowel sounds. No rebound or guarding.  Ext: no edema, 2+ PT pulses Skin: warm, dry, no rash Neuro: grossly normal, moves all extremities, PERRLA    Assessment/Plan:  54 y.o. male presenting for annual physical.  Health Maintenance counseling: 1. Anticipatory guidance: Patient counseled regarding regular dental exams, wearing seatbelts, wearing sunscreen.  2. Risk factor reduction:  Advised patient of need for regular exercise (150 minutes a week) and diet rich and fruits and vegetables to reduce risk of heart attack and stroke.  3. Immunizations/screenings/ancillary studies- refuses flu 4. Reviewed all labs  Hyperlipidemia Triglycerides are main issue. Continue red yeast rice. Start fenofibrate given over 500 and risk of pancreatitis as well as cardiac risk. Advised statin. Patient will follow up for lipid panel in 3 months and he will consider at that time.   Testicular hypofunction Testosterone going down off of clomid and symptoms more prevalent. Patient will restart. We discussed risks.   Return precautions advised. 3 month follow up.   Orders Placed This Encounter  Procedures  . Lipid Panel    Standing Status: Future     Number of Occurrences:      Standing Expiration Date: 08/08/2015    Meds ordered this encounter  Medications  . clomiPHENE (CLOMID) 50 MG tablet    Sig: Takes one half tablet every other day    Dispense:  30 tablet    Refill:  5  . fenofibrate (TRICOR) 145 MG tablet    Sig: Take 1 tablet (145 mg total) by mouth daily.    Dispense:  30 tablet    Refill:  5

## 2014-08-07 NOTE — Assessment & Plan Note (Signed)
Testosterone going down off of clomid and symptoms more prevalent. Patient will restart. We discussed risks.

## 2014-08-07 NOTE — Assessment & Plan Note (Signed)
Triglycerides are main issue. Continue red yeast rice. Start fenofibrate given over 500 and risk of pancreatitis as well as cardiac risk. Advised statin. Patient will follow up for lipid panel in 3 months and he will consider at that time.

## 2014-08-08 ENCOUNTER — Telehealth: Payer: Self-pay | Admitting: Family Medicine

## 2014-08-08 NOTE — Telephone Encounter (Signed)
Patient called stating fenofibrate 145 requires a PA.  I called and submitted a PA request to [facility]. Ref# [id number]

## 2014-08-09 ENCOUNTER — Telehealth: Payer: Self-pay | Admitting: Family Medicine

## 2014-08-09 ENCOUNTER — Other Ambulatory Visit: Payer: Self-pay

## 2014-08-09 MED ORDER — FENOFIBRATE 160 MG PO TABS: 160.0000 mg | ORAL_TABLET | Freq: Every day | ORAL | Status: DC

## 2014-08-09 NOTE — Telephone Encounter (Signed)
See below

## 2014-08-09 NOTE — Telephone Encounter (Signed)
Change to 160mg  please keba-same # and refills

## 2014-08-09 NOTE — Telephone Encounter (Signed)
Changes made and new Rx sent in

## 2014-08-09 NOTE — Telephone Encounter (Signed)
PA for fenofibrate 145 mg was denied.  Patient's plan requires patient to try and fail fenofibrate 54 mg or 160 mg.

## 2014-10-30 ENCOUNTER — Other Ambulatory Visit (INDEPENDENT_AMBULATORY_CARE_PROVIDER_SITE_OTHER): Payer: 59

## 2014-10-30 DIAGNOSIS — E291 Testicular hypofunction: Secondary | ICD-10-CM | POA: Diagnosis not present

## 2014-10-30 DIAGNOSIS — E785 Hyperlipidemia, unspecified: Secondary | ICD-10-CM

## 2014-10-30 LAB — LIPID PANEL
CHOL/HDL RATIO: 4
CHOLESTEROL: 179 mg/dL (ref 0–200)
HDL: 41.3 mg/dL (ref >39.00)
LDL Cholesterol: 106 mg/dL — ABNORMAL HIGH (ref 0–99)
NONHDL: 137.7
TRIGLYCERIDES: 157 mg/dL — AB (ref 0.0–149.0)
VLDL: 31.4 mg/dL (ref 0.0–40.0)

## 2014-10-30 LAB — TESTOSTERONE: TESTOSTERONE: 288.52 ng/dL — AB (ref 300.00–890.00)

## 2014-11-06 ENCOUNTER — Encounter: Payer: Self-pay | Admitting: Family Medicine

## 2014-11-06 ENCOUNTER — Ambulatory Visit (INDEPENDENT_AMBULATORY_CARE_PROVIDER_SITE_OTHER): Payer: 59 | Admitting: Family Medicine

## 2014-11-06 VITALS — BP 100/68 | HR 84 | Temp 98.0°F | Wt 178.0 lb

## 2014-11-06 DIAGNOSIS — E781 Pure hyperglyceridemia: Secondary | ICD-10-CM

## 2014-11-06 DIAGNOSIS — E785 Hyperlipidemia, unspecified: Secondary | ICD-10-CM | POA: Diagnosis not present

## 2014-11-06 DIAGNOSIS — E291 Testicular hypofunction: Secondary | ICD-10-CM | POA: Diagnosis not present

## 2014-11-06 DIAGNOSIS — R300 Dysuria: Secondary | ICD-10-CM

## 2014-11-06 LAB — POCT URINALYSIS DIPSTICK
Bilirubin, UA: NEGATIVE
Glucose, UA: NEGATIVE
Ketones, UA: NEGATIVE
LEUKOCYTES UA: NEGATIVE
NITRITE UA: NEGATIVE
Protein, UA: NEGATIVE
RBC UA: 7
Spec Grav, UA: 1.01
UROBILINOGEN UA: 0.2
pH, UA: 7

## 2014-11-06 MED ORDER — FENOFIBRATE 160 MG PO TABS: 160.0000 mg | ORAL_TABLET | Freq: Every day | ORAL | Status: DC

## 2014-11-06 NOTE — Assessment & Plan Note (Signed)
Triglycerides greatly improved. Continue fenofibrate 160 mg daily. Gave printed Rx so he can price shop

## 2014-11-06 NOTE — Assessment & Plan Note (Signed)
Testosterone up to 290 from less than 225 on Clomid 25 mg every other day. Patient wishes levels were higher and still has some fatigue. I discussed with him a referral to endocrinology or urology. He wants to continue on current dose for now and continue to monitor. We will follow-up in 6 months. He has used injections in the past but has side effects. He still wants to consider reusing these in future.

## 2014-11-06 NOTE — Patient Instructions (Addendum)
Check urine to make sure no infection. Happy to do STD testing in future if persists and test negative.   Testosterone levels up, hopeful they will get over 300. Could consider urology referral or endocrine if don't get results we want or potentially switch back to injections. Follow up 6 months.   Triglycerides look great. Shop printed script.

## 2014-11-06 NOTE — Progress Notes (Signed)
Garret Reddish, MD Phone: [phone]  Subjective:   Calvin Adams is a 54 y.o. year old very pleasant male patient who presents with the following:  Hypertriglyceridemia -Triglycerides down to less than 200 after being greater than 500 since starting fenofibrate 160 mg daily. Patient's cost is a concern as it is $40 a month. ROS-no chest pain shortness of breath  Testicular hypofunction Fatigue some improved, worse over last week. Compliant with Clomid half tab every other day to 50 mg. He wishes his energy was somewhat better and that his levels of testosterone were somewhat higher.  Past Medical History- Patient Active Problem List   Diagnosis Date Noted  . Testicular hypofunction 07/29/2009    Priority: Medium  . Hyperlipidemia 05/30/2007    Priority: Medium  . ASTHMA 05/30/2007    Priority: Medium  . 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 Hx of umbilical hernia repair 40/03/6760    Priority: Low  . SHOULDER IMPINGEMENT SYNDROME, RIGHT 12/16/2009    Priority: Low  . BACK PAIN 11/29/2009    Priority: Low  . ERECTILE DYSFUNCTION, ORGANIC 04/24/2009    Priority: Low  . ARTHRITIS, RIGHT FOOT 04/16/2008    Priority: Low  . HERPES, GENITAL NOS 05/30/2007    Priority: Low  . ALLERGIC RHINITIS 05/30/2007    Priority: Low   Medications- reviewed and updated Current Outpatient Prescriptions  Medication Sig Dispense Refill  . clomiPHENE (CLOMID) 50 MG tablet Takes one half tablet every other day 30 tablet 5  . fenofibrate 160 MG tablet Take 1 tablet (160 mg total) by mouth daily. 30 tablet 5  . Multiple Vitamin (MULTIVITAMIN) capsule Take 1 capsule by mouth daily.      . vitamin C (ASCORBIC ACID) 500 MG tablet Take 500 mg by mouth daily.    . Red Yeast Rice Extract (RED YEAST RICE PO) Take by mouth daily.     No current facility-administered medications for this visit.    Objective: BP 100/68 mmHg  Pulse 84  Temp(Src) 98 F (36.7 C)  Wt 178  lb (80.74 kg) Gen: NAD, resting comfortably in chair CV: RRR no murmurs rubs or gallops Lungs: CTAB no crackles, wheeze, rhonchi Abdomen: soft/nontender/nondistended/normal bowel sounds.  Ext: no edema Skin: warm, dry, no rash Neuro: grossly normal, moves all extremities   Results for orders placed or performed in visit on 11/06/14 (from the past 24 hour(s))  POCT urinalysis dipstick     Status: None   Collection Time: 11/06/14  2:37 PM  Result Value Ref Range   Color, UA yellow    Clarity, UA clear    Glucose, UA neg    Bilirubin, UA neg    Ketones, UA neg    Spec Grav, UA 1.010    Blood, UA 7.0    pH, UA 7.0    Protein, UA neg    Urobilinogen, UA 0.2    Nitrite, UA neg    Leukocytes, UA Negative    Assessment/Plan:  Hyperlipidemia Triglycerides greatly improved. Continue fenofibrate 160 mg daily. Gave printed Rx so he can price shop   Testicular hypofunction Testosterone up to 290 from less than 225 on Clomid 25 mg every other day. Patient wishes levels were higher and still has some fatigue. I discussed with him a referral to endocrinology or urology. He wants to continue on current dose for now and continue to monitor. We will follow-up in 6 months. He has used injections in the past but has side effects.  He still wants to consider reusing these in future.   Dysuria  S:1-2 weeks intermittent discomfort slight burning perhaps. Nothing seems to make it better or worse. A/P: UA negative today. Doubt infection from UTI. Has not been sexually active recently but discussed reasonable to check STDs and patient declines  Return precautions advised. Six-month follow-up plan.   Meds ordered this encounter  Medications  . fenofibrate 160 MG tablet    Sig: Take 1 tablet (160 mg total) by mouth daily.    Dispense:  30 tablet    Refill:  5

## 2015-01-02 ENCOUNTER — Telehealth: Payer: Self-pay | Admitting: Family Medicine

## 2015-01-02 DIAGNOSIS — R7989 Other specified abnormal findings of blood chemistry: Secondary | ICD-10-CM

## 2015-01-02 DIAGNOSIS — E785 Hyperlipidemia, unspecified: Secondary | ICD-10-CM

## 2015-01-02 NOTE — Telephone Encounter (Signed)
Pt is schedule for 6 month follow up in oct 2016. Pt is on chole med and would like to have chole etc recheck. Can I sch?

## 2015-01-03 NOTE — Telephone Encounter (Signed)
Will pt need any other labs? Didn't see anything in your last OV note.

## 2015-01-03 NOTE — Telephone Encounter (Signed)
Pt has been sch

## 2015-01-03 NOTE — Telephone Encounter (Signed)
Yes, also check a testosterone under low testosterone

## 2015-01-03 NOTE — Telephone Encounter (Signed)
Labs have been entered, ok to schedule.

## 2015-03-29 ENCOUNTER — Other Ambulatory Visit: Payer: Self-pay

## 2015-03-29 ENCOUNTER — Other Ambulatory Visit: Payer: Self-pay | Admitting: *Deleted

## 2015-03-29 MED ORDER — FENOFIBRATE 160 MG PO TABS: 160.0000 mg | ORAL_TABLET | Freq: Every day | ORAL | Status: DC

## 2015-03-29 NOTE — Telephone Encounter (Signed)
Fenofibrate refilled. 

## 2015-04-30 ENCOUNTER — Other Ambulatory Visit (INDEPENDENT_AMBULATORY_CARE_PROVIDER_SITE_OTHER): Payer: 59

## 2015-04-30 DIAGNOSIS — E785 Hyperlipidemia, unspecified: Secondary | ICD-10-CM

## 2015-04-30 DIAGNOSIS — R7989 Other specified abnormal findings of blood chemistry: Secondary | ICD-10-CM

## 2015-04-30 DIAGNOSIS — E291 Testicular hypofunction: Secondary | ICD-10-CM

## 2015-04-30 LAB — LDL CHOLESTEROL, DIRECT: LDL DIRECT: 91 mg/dL

## 2015-04-30 LAB — TESTOSTERONE: TESTOSTERONE: 349.66 ng/dL (ref 300.00–890.00)

## 2015-05-02 ENCOUNTER — Other Ambulatory Visit: Payer: 59

## 2015-05-09 ENCOUNTER — Ambulatory Visit: Payer: 59 | Admitting: Family Medicine

## 2015-05-15 ENCOUNTER — Ambulatory Visit: Payer: 59 | Admitting: Family Medicine

## 2015-05-21 ENCOUNTER — Ambulatory Visit: Payer: 59 | Admitting: Family Medicine

## 2015-05-31 ENCOUNTER — Ambulatory Visit: Payer: 59 | Admitting: Family Medicine

## 2015-06-04 ENCOUNTER — Ambulatory Visit (INDEPENDENT_AMBULATORY_CARE_PROVIDER_SITE_OTHER): Payer: 59 | Admitting: Family Medicine

## 2015-06-04 ENCOUNTER — Encounter: Payer: Self-pay | Admitting: Family Medicine

## 2015-06-04 VITALS — BP 100/64 | HR 70 | Temp 98.7°F | Wt 177.0 lb

## 2015-06-04 DIAGNOSIS — M150 Primary generalized (osteo)arthritis: Secondary | ICD-10-CM

## 2015-06-04 DIAGNOSIS — Z20828 Contact with and (suspected) exposure to other viral communicable diseases: Secondary | ICD-10-CM | POA: Diagnosis not present

## 2015-06-04 DIAGNOSIS — M1990 Unspecified osteoarthritis, unspecified site: Secondary | ICD-10-CM | POA: Insufficient documentation

## 2015-06-04 DIAGNOSIS — M159 Polyosteoarthritis, unspecified: Secondary | ICD-10-CM

## 2015-06-04 DIAGNOSIS — E781 Pure hyperglyceridemia: Secondary | ICD-10-CM | POA: Diagnosis not present

## 2015-06-04 DIAGNOSIS — E291 Testicular hypofunction: Secondary | ICD-10-CM | POA: Diagnosis not present

## 2015-06-04 DIAGNOSIS — M8949 Other hypertrophic osteoarthropathy, multiple sites: Secondary | ICD-10-CM

## 2015-06-04 NOTE — Patient Instructions (Signed)
Schedule a lab visit at the front desk. Return for future fasting labs. Nothing but water after midnight please. Sorry that the triglycerides were not included in your labs last time.   Sorry about the arthritis issues- wish I had a better solution. As needed meloxicam is reasonable choice

## 2015-06-04 NOTE — Assessment & Plan Note (Signed)
S: Patient continues to have fatigue. Tries to attribute arthralgias to low testosterone due to "reduced muscle mass" forcing more weight on bones. Testosterone within normal limits but lower end of normal and patient would like higher. Not sure if he wants to do injections again though.  A/P: advised continued Clomid 25mg  every other day as controlled. Extensive counseling provided

## 2015-06-04 NOTE — Progress Notes (Signed)
Garret Reddish, MD  Subjective:  Calvin Adams is a 54 y.o. year old very pleasant male patient who presents for/with See problem oriented charting ROS- No chest pain or shortness of breath. No headache or blurry vision. No joint stiffness. Admits to fatigue.   Past Medical History-  Patient Active Problem List   Diagnosis Date Noted  . Testicular hypofunction 07/29/2009    Priority: Medium  . Hypertriglyceridemia 05/30/2007    Priority: Medium  . ASTHMA 05/30/2007    Priority: Medium  . 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 Hx of umbilical hernia repair 82/42/3536    Priority: Low  . SHOULDER IMPINGEMENT SYNDROME, RIGHT 12/16/2009    Priority: Low  . BACK PAIN 11/29/2009    Priority: Low  . ERECTILE DYSFUNCTION, ORGANIC 04/24/2009    Priority: Low  . ARTHRITIS, RIGHT FOOT 04/16/2008    Priority: Low  . HERPES, GENITAL NOS 05/30/2007    Priority: Low  . ALLERGIC RHINITIS 05/30/2007    Priority: Low  . Osteoarthritis 06/04/2015    Medications- reviewed and updated Current Outpatient Prescriptions  Medication Sig Dispense Refill  . clomiPHENE (CLOMID) 50 MG tablet Takes one half tablet every other day 30 tablet 5  . fenofibrate 160 MG tablet Take 1 tablet (160 mg total) by mouth daily. 30 tablet 5  . Multiple Vitamin (MULTIVITAMIN) capsule Take 1 capsule by mouth daily.      . vitamin C (ASCORBIC ACID) 500 MG tablet Take 500 mg by mouth daily.     No current facility-administered medications for this visit.    Objective: BP 100/64 mmHg  Pulse 70  Temp(Src) 98.7 F (37.1 C)  Wt 177 lb (80.287 kg) Gen: NAD, resting comfortably CV: RRR no murmurs rubs or gallops Lungs: CTAB no crackles, wheeze, rhonchi Abdomen: soft/nontender/nondistended/normal bowel sounds. No rebound or guarding.  Ext: no edema Skin: warm, dry MSK: no swollen or warm joints- no pain with palpation Neuro: grossly normal, moves all  extremities  Assessment/Plan:  Testicular hypofunction S: Patient continues to have fatigue. Tries to attribute arthralgias to low testosterone due to "reduced muscle mass" forcing more weight on bones. Testosterone within normal limits but lower end of normal and patient would like higher. Not sure if he wants to do injections again though.  A/P: advised continued Clomid 25mg  every other day as controlled. Extensive counseling provided   Hypertriglyceridemia S: fenofibrate remains expensive even with price shopping-thinks it is his insurance which will be changing. He is missing dose twice a week on average. Triglycerides were to be checked but patient called in for cholesterol check and direct LDL was ordered Lab Results  Component Value Date   CHOL 179 10/30/2014   HDL 41.30 10/30/2014   LDLCALC 106* 10/30/2014   LDLDIRECT 91.0 04/30/2015   TRIG 157.0* 10/30/2014   CHOLHDL 4 10/30/2014  A/P:  Will repeat fasting triglycerides. Hopeful <200. Not taking red yeast rice anymore which could affect some.    Osteoarthritis S:30 bales of pinestraw- joints swollen in hands. Also tends to affect knees and elbows. With activity- most joints tend to get irritated. Lifts weights and if nto doing repetitive motion does not sseem to be major issue. Several years of issues. Glucosamine was not helpful when tried in past. Has some mobic from ortho which helps.  A/P: advised patient to consider icing after activity to see if helps. Could retrial glucosamine or be more proactive when needed with mobic.   6 months CPE  Future fasting Orders Placed This Encounter  Procedures  . HIV antibody (with reflex)    Standing Status: Future     Number of Occurrences:      Standing Expiration Date: 06/03/2016  . Hepatitis C antibody, reflex    [facility]    Standing Status: Future     Number of Occurrences:      Standing Expiration Date: 06/03/2016  . Triglycerides    Standing Status: Future     Number of  Occurrences:      Standing Expiration Date: 06/03/2016    >50% of 30 minute office visit was spent on counseling (in regards to management OA and patient concern about testosterone levels and comforting about cost of medicines and healthcare) and coordination of care

## 2015-06-04 NOTE — Assessment & Plan Note (Signed)
S:30 bales of pinestraw- joints swollen in hands. Also tends to affect knees and elbows. With activity- most joints tend to get irritated. Lifts weights and if nto doing repetitive motion does not sseem to be major issue. Several years of issues. Glucosamine was not helpful when tried in past. Has some mobic from ortho which helps.  A/P: advised patient to consider icing after activity to see if helps. Could retrial glucosamine or be more proactive when needed with mobic.

## 2015-06-04 NOTE — Assessment & Plan Note (Signed)
S: fenofibrate remains expensive even with price shopping-thinks it is his insurance which will be changing. He is missing dose twice a week on average. Triglycerides were to be checked but patient called in for cholesterol check and direct LDL was ordered Lab Results  Component Value Date   CHOL 179 10/30/2014   HDL 41.30 10/30/2014   LDLCALC 106* 10/30/2014   LDLDIRECT 91.0 04/30/2015   TRIG 157.0* 10/30/2014   CHOLHDL 4 10/30/2014  A/P:  Will repeat fasting triglycerides. Hopeful <200. Not taking red yeast rice anymore which could affect some.

## 2015-06-21 ENCOUNTER — Other Ambulatory Visit: Payer: 59

## 2015-07-03 ENCOUNTER — Other Ambulatory Visit: Payer: Self-pay | Admitting: Family Medicine

## 2015-07-03 ENCOUNTER — Other Ambulatory Visit (INDEPENDENT_AMBULATORY_CARE_PROVIDER_SITE_OTHER): Payer: 59

## 2015-07-03 DIAGNOSIS — Z20828 Contact with and (suspected) exposure to other viral communicable diseases: Secondary | ICD-10-CM | POA: Diagnosis not present

## 2015-07-03 DIAGNOSIS — E781 Pure hyperglyceridemia: Secondary | ICD-10-CM

## 2015-07-03 LAB — TRIGLYCERIDES: TRIGLYCERIDES: 247 mg/dL — AB (ref 0.0–149.0)

## 2015-07-04 LAB — HEPATITIS C ANTIBODY: HCV AB: NEGATIVE

## 2015-07-04 LAB — HIV ANTIBODY (ROUTINE TESTING W REFLEX): HIV 1&2 Ab, 4th Generation: NONREACTIVE

## 2015-08-30 ENCOUNTER — Other Ambulatory Visit: Payer: Self-pay | Admitting: Family Medicine

## 2015-11-01 ENCOUNTER — Telehealth: Payer: Self-pay | Admitting: Family Medicine

## 2015-11-01 NOTE — Telephone Encounter (Signed)
You can tell him to do it at physical or if he prefers can do trig under hypertriglyceridemia and testosterone under low testosterone then schedule a regular office visit- either is fine with me

## 2015-11-01 NOTE — Telephone Encounter (Signed)
Pt should have followed up in 3 months, but forgot. Pt states he needs to have triglycerides and testosterone checked every 3 months. Is it ok to schedule these labs?  Pt would like a follow up appointment at the same time.

## 2015-11-01 NOTE — Telephone Encounter (Signed)
I didn't see anything documented about triglycerides or testerone recheck every three months. In last OV i saw where you put for pt to come in for CPE in 6 months which will be in May.

## 2015-11-04 NOTE — Telephone Encounter (Signed)
See below, Juliann Pulse.

## 2015-11-05 NOTE — Telephone Encounter (Signed)
Left message to call back  

## 2015-11-07 ENCOUNTER — Other Ambulatory Visit: Payer: Self-pay

## 2015-11-07 MED ORDER — FENOFIBRATE 160 MG PO TABS: 160.0000 mg | ORAL_TABLET | Freq: Every day | ORAL | Status: DC

## 2015-11-14 ENCOUNTER — Telehealth: Payer: Self-pay | Admitting: Family Medicine

## 2015-11-14 ENCOUNTER — Other Ambulatory Visit: Payer: Self-pay | Admitting: Family Medicine

## 2015-11-14 DIAGNOSIS — E781 Pure hyperglyceridemia: Secondary | ICD-10-CM

## 2015-11-14 NOTE — Telephone Encounter (Signed)
Lab ordered, schedule lab visit.

## 2015-11-14 NOTE — Telephone Encounter (Signed)
Yes thanks 

## 2015-11-14 NOTE — Telephone Encounter (Signed)
Ok to order 

## 2015-11-14 NOTE — Telephone Encounter (Signed)
Pt would like to have his Triglycerdes checked. May we have an order please.

## 2015-11-18 NOTE — Telephone Encounter (Signed)
lmovm to call and schedule

## 2015-11-20 ENCOUNTER — Other Ambulatory Visit: Payer: Self-pay

## 2015-11-20 ENCOUNTER — Telehealth: Payer: Self-pay | Admitting: Family Medicine

## 2015-11-20 NOTE — Telephone Encounter (Signed)
Pt needs new rx  Testosterone-cyp 200 mg inj . [facility]. Pt has new pharm

## 2015-11-21 MED ORDER — TESTOSTERONE CYPIONATE 200 MG/ML IM SOLN: 50.0000 mg | INTRAMUSCULAR | Status: DC

## 2015-11-21 NOTE — Telephone Encounter (Signed)
I printed this. Let's have him follow up in 3 months to assess how he is doing and update labs at that time. Appointment should be on 3rd or 4th day away from shot so if takes on Sunday would want to see him on Wednesday or Thursday for example

## 2015-11-21 NOTE — Telephone Encounter (Signed)
Is this ok?

## 2015-11-21 NOTE — Telephone Encounter (Signed)
Norma see below.

## 2015-11-21 NOTE — Telephone Encounter (Signed)
See below Norma 

## 2015-11-22 NOTE — Telephone Encounter (Signed)
Pt has been scheduled.  °

## 2015-11-22 NOTE — Telephone Encounter (Signed)
Pt has a cpx in june

## 2015-11-22 NOTE — Telephone Encounter (Signed)
Rx faxed to pharmacy yesterday .

## 2015-12-10 ENCOUNTER — Ambulatory Visit (INDEPENDENT_AMBULATORY_CARE_PROVIDER_SITE_OTHER): Payer: BLUE CROSS/BLUE SHIELD | Admitting: Family Medicine

## 2015-12-10 ENCOUNTER — Encounter: Payer: Self-pay | Admitting: Family Medicine

## 2015-12-10 VITALS — BP 110/70 | HR 69 | Temp 98.0°F | Wt 178.0 lb

## 2015-12-10 DIAGNOSIS — E291 Testicular hypofunction: Secondary | ICD-10-CM

## 2015-12-10 DIAGNOSIS — R200 Anesthesia of skin: Secondary | ICD-10-CM | POA: Diagnosis not present

## 2015-12-10 DIAGNOSIS — R202 Paresthesia of skin: Secondary | ICD-10-CM

## 2015-12-10 MED ORDER — CLOMIPHENE CITRATE 50 MG PO TABS: 25.0000 mg | ORAL_TABLET | ORAL | Status: DC

## 2015-12-10 NOTE — Progress Notes (Signed)
Subjective:  Calvin Adams is a 55 y.o. year old very pleasant male patient who presents for/with See problem oriented charting ROS- no chest pain. No headache. No fever or chills. No cough. Does have some sinus congestion from allergies..see any ROS included in HPI as well.   Past Medical History-  Patient Active Problem List   Diagnosis Date Noted  . Testicular hypofunction 07/29/2009    Priority: Medium  . Hypertriglyceridemia 05/30/2007    Priority: Medium  . ASTHMA 05/30/2007    Priority: Medium  . 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 Hx of umbilical hernia repair [id number]    Priority: Low  . SHOULDER IMPINGEMENT SYNDROME, RIGHT 12/16/2009    Priority: Low  . BACK PAIN 11/29/2009    Priority: Low  . ERECTILE DYSFUNCTION, ORGANIC 04/24/2009    Priority: Low  . ARTHRITIS, RIGHT FOOT 04/16/2008    Priority: Low  . HERPES, GENITAL NOS 05/30/2007    Priority: Low  . ALLERGIC RHINITIS 05/30/2007    Priority: Low  . Osteoarthritis 06/04/2015    Medications- reviewed and updated Current Outpatient Prescriptions  Medication Sig Dispense Refill  . fenofibrate 160 MG tablet Take 1 tablet (160 mg total) by mouth daily. 30 tablet 5  . Multiple Vitamin (MULTIVITAMIN) capsule Take 1 capsule by mouth daily.      . vitamin C (ASCORBIC ACID) 500 MG tablet Take 500 mg by mouth daily.     Testosterone cypionate 0.25 mL injected once a week (200mg /ml)    Objective: BP 110/70 mmHg  Pulse 69  Temp(Src) 98 F (36.7 C)  Wt 178 lb (80.74 kg)  SpO2 97% Gen: NAD, resting comfortably Nares normal. Oropharynx normal. CV: RRR no murmurs rubs or gallops Lungs: CTAB no crackles, wheeze, rhonchi Abdomen: soft/nontender/nondistended/normal bowel sounds. No rebound or guarding.  Ext: no edema Skin: warm, dry Neuro: grossly normal, moves all extremities  Assessment/Plan:   Hypogonadism  Difficulty taking deep breath  Numbness and tingling on the face  S:  original reason weaned off testosterone injections would feel like get short of breath at night-just felt like he could not take a deep breath, never had any true oxygenation issues. He was told that probably could be related to the testosterone injections.  States since he restarted his injections a few weeks ago he started having the same issue again. He did not feel like he has much benefit on the Clomid so preferred to try injections again. By the time its just before time to take next injection feels like he has a hard time getting a full breath in. Also prone to have similar symptoms with pollen. Sunday night felt slightly (was supposed to take injection on Tuesday) short of breath, noted he was yawning more. Took some aspirin and then symptoms resolved as far as tiredness and shortness of breath.   Yesterday, drove to [place] to help mother- Rushed through grocery store to help his mom and felt mildly dizzy and then developed numb sensation into left of head and into his jaw. No shortness of breath at that time. No chest pain. Episode lasted several hours from noon until 11 pm. Today felt slight tingling on his head at times but not to degree he felt yesterday and at this point has resolved  No exertional component to difficulty taking deep breath or nubness/tingling in face Able to lift weights and do cardio without difficulty. Dyspnea comes and goes- notices more when testosterone should be running out. Has  high triglycerides at baseline and takes fenofibrate- his concern is potential for stroke or mini stroke  A/P: Patient has had a recurrence of symptoms that he had previously when on testosterone injections, namely difficulty feeling that he can take a deep breath. I do not see this as a typical reaction to testosterone but given the timing believe it isimportant to take him off the injections. We will restart his clomid 25 mg every other day. We will refer him to endocrinology. Given there is no  exertional component whatsoever to this I doubt this is cardiac in origin. In addition he has no signs or symptoms of DVT and doubt pulmonary embolism with only intermittent  nature of issue. His Wells criteria for PE is 0. Also the time course of his symptoms getting worse right before injections is reassuring that there is no serious underlying disease.  In regards to numbness and tingling on his face, there is potential this could represent TIA. This resolved within 24 hours and I doubt stroke. We discussed embarking on stroke workup including MRI, carotid Dopplers, echocardiogram. I told patient I did not think this was high yield but given the risks was reasonable to proceed. He declined this workup at this time and would like to see how he does off the testosterone. We will start him on a baby aspirin 81 mg which would be the major change if this was TIA-but also could start statin and that instance. We discussed if he has new or worsening symptoms he is to proceed to emergent care.  Orders Placed This Encounter  Procedures  . Ambulatory referral to Endocrinology    Referral Priority:  Routine    Referral Type:  Consultation    Referral Reason:  Specialty Services Required    Number of Visits Requested:  1    Meds ordered this encounter  Medications  . clomiPHENE (CLOMID) 50 MG tablet    Sig: Take 0.5 tablets (25 mg total) by mouth every other day.    Dispense:  30 tablet    Refill:  5   The duration of face-to-face time during this visit was 30 minutes. Greater than 50% of this time was spent in counseling, explanation of diagnosis, planning of further management, and/or coordination of care.   Garret Reddish, MD

## 2015-12-10 NOTE — Patient Instructions (Addendum)
Stop testosterone injections  Start aspirin 81mg   Wonder if your symptoms are related to the testosterone but starting daily aspirin would cover this if it was in fact a TIA. Odd that you have had difficulty feeling like you can get a full breath in now twice on testosterone injections. If you have worsening shortness of breath or recurrence of the numbness tingling should seek emergent care  We will call you within a week about your referral to endocrine. If you do not hear within 2 weeks, give [place] a call.

## 2016-01-14 ENCOUNTER — Ambulatory Visit: Payer: BLUE CROSS/BLUE SHIELD | Admitting: Endocrinology

## 2016-01-31 ENCOUNTER — Other Ambulatory Visit (INDEPENDENT_AMBULATORY_CARE_PROVIDER_SITE_OTHER): Payer: BLUE CROSS/BLUE SHIELD

## 2016-01-31 DIAGNOSIS — E781 Pure hyperglyceridemia: Secondary | ICD-10-CM | POA: Diagnosis not present

## 2016-01-31 DIAGNOSIS — Z0000 Encounter for general adult medical examination without abnormal findings: Secondary | ICD-10-CM

## 2016-01-31 LAB — BASIC METABOLIC PANEL
BUN: 15 mg/dL (ref 6–23)
CALCIUM: 9.4 mg/dL (ref 8.4–10.5)
CO2: 31 meq/L (ref 19–32)
CREATININE: 1.21 mg/dL (ref 0.40–1.50)
Chloride: 106 mEq/L (ref 96–112)
GFR: 66.61 mL/min (ref >60.00)
GLUCOSE: 83 mg/dL (ref 70–99)
Potassium: 4.6 mEq/L (ref 3.5–5.1)
SODIUM: 142 meq/L (ref 135–145)

## 2016-01-31 LAB — LIPID PANEL
CHOL/HDL RATIO: 4
Cholesterol: 169 mg/dL (ref 0–200)
HDL: 38.2 mg/dL — AB (ref >39.00)
LDL CALC: 92 mg/dL (ref 0–99)
NONHDL: 130.55
Triglycerides: 195 mg/dL — ABNORMAL HIGH (ref 0.0–149.0)
VLDL: 39 mg/dL (ref 0.0–40.0)

## 2016-01-31 LAB — POC URINALSYSI DIPSTICK (AUTOMATED)
BILIRUBIN UA: NEGATIVE
GLUCOSE UA: NEGATIVE
Ketones, UA: NEGATIVE
Leukocytes, UA: NEGATIVE
Nitrite, UA: NEGATIVE
PH UA: 8.5
PROTEIN UA: NEGATIVE
RBC UA: NEGATIVE
SPEC GRAV UA: 1.015
UROBILINOGEN UA: 0.2

## 2016-01-31 LAB — CBC WITH DIFFERENTIAL/PLATELET
BASOS ABS: 0 10*3/uL (ref 0.0–0.1)
Basophils Relative: 0.4 % (ref 0.0–3.0)
EOS ABS: 0.2 10*3/uL (ref 0.0–0.7)
Eosinophils Relative: 4.3 % (ref 0.0–5.0)
HCT: 42.7 % (ref 39.0–52.0)
Hemoglobin: 14.6 g/dL (ref 13.0–17.0)
LYMPHS ABS: 1 10*3/uL (ref 0.7–4.0)
Lymphocytes Relative: 24.9 % (ref 12.0–46.0)
MCHC: 34.2 g/dL (ref 30.0–36.0)
MCV: 90.3 fl (ref 78.0–100.0)
MONO ABS: 0.4 10*3/uL (ref 0.1–1.0)
MONOS PCT: 10.2 % (ref 3.0–12.0)
NEUTROS ABS: 2.5 10*3/uL (ref 1.4–7.7)
NEUTROS PCT: 60.2 % (ref 43.0–77.0)
PLATELETS: 187 10*3/uL (ref 150.0–400.0)
RBC: 4.73 Mil/uL (ref 4.22–5.81)
RDW: 12.7 % (ref 11.5–15.5)
WBC: 4.1 10*3/uL (ref 4.0–10.5)

## 2016-01-31 LAB — HEPATIC FUNCTION PANEL
ALBUMIN: 4.5 g/dL (ref 3.5–5.2)
ALK PHOS: 39 U/L (ref 39–117)
ALT: 18 U/L (ref 0–53)
AST: 16 U/L (ref 0–37)
BILIRUBIN DIRECT: 0.1 mg/dL (ref 0.0–0.3)
BILIRUBIN TOTAL: 0.9 mg/dL (ref 0.2–1.2)
Total Protein: 6.3 g/dL (ref 6.0–8.3)

## 2016-01-31 LAB — PSA: PSA: 1.94 ng/mL (ref 0.10–4.00)

## 2016-01-31 LAB — TRIGLYCERIDES: Triglycerides: 195 mg/dL — ABNORMAL HIGH (ref 0.0–149.0)

## 2016-01-31 LAB — TSH: TSH: 2.64 u[IU]/mL (ref 0.35–4.50)

## 2016-02-05 ENCOUNTER — Encounter: Payer: Self-pay | Admitting: Endocrinology

## 2016-02-05 ENCOUNTER — Ambulatory Visit (INDEPENDENT_AMBULATORY_CARE_PROVIDER_SITE_OTHER): Payer: BLUE CROSS/BLUE SHIELD | Admitting: Endocrinology

## 2016-02-05 VITALS — BP 122/80 | HR 65 | Ht 69.0 in | Wt 175.0 lb

## 2016-02-05 DIAGNOSIS — E291 Testicular hypofunction: Secondary | ICD-10-CM

## 2016-02-05 LAB — LUTEINIZING HORMONE: LH: 4.43 m[IU]/mL (ref 1.50–9.30)

## 2016-02-05 NOTE — Progress Notes (Signed)
Subjective:    Patient ID: Calvin Adams, male    DOB: 1961/04/17, 55 y.o.   MRN: [id number]  HPI Pt reports he had puberty at the normal age.  He has no biological children.  He says he has never taken illicit androgens.  He was noted to have low testosterone in 2012.  He took axiron, and later testosterone injections.  He was changed to clomid in 2014.  He continued to also take intermittent testosterone injections, but he has taken just 1 testosterone injection in 2017 (approx April, 2017).  He last took clomid 3 weeks ago.  He does not take antiandrogens or opioids.  He denies any h/o infertility, XRT, or genital infection.  He has never had surgery, or a serious injury to the head or genital area.  He does not consume alcohol excessively.  He has many years of slightly decreased muscle strength throughout the body, and assoc fatigue.  He was hospitalized for a head injury in 1985.  He has surgical repair of a spermatocoele in the 1990's.  Since on the clomid, he felt only slightly better.  Since off it, he feels no different. Past Medical History  Diagnosis Date  . Hyperlipidemia   . Allergy   . Umbilical hernia   . Arthritis     Past Surgical History  Procedure Laterality Date  . Spermaticeal removal      in [place], [place]  . Umbilical hernia repair  2013    Social History   Social History  . Marital Status: Single    Spouse Name: N/A  . Number of Children: N/A  . Years of Education: N/A   Occupational History  . Not on file.   Social History Main Topics  . Smoking status: Never Smoker   . Smokeless tobacco: Never Used  . Alcohol Use: Yes     Comment: rare usage  . Drug Use: No  . Sexual Activity: No   Other Topics Concern  . Not on file   Social History Narrative   Regular exercise      From [place], [place]. Moved [place] in 2002.    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    Lives alone. Single. 2 brothers and a sister    Hobbies: working in yard, gardening, working  out    Current Outpatient Prescriptions on File Prior to Visit  Medication Sig Dispense Refill  . aspirin 81 MG tablet Take 81 mg by mouth daily.    . fenofibrate 160 MG tablet Take 1 tablet (160 mg total) by mouth daily. 30 tablet 5  . Multiple Vitamin (MULTIVITAMIN) capsule Take 1 capsule by mouth daily.      . clomiPHENE (CLOMID) 50 MG tablet Take 0.5 tablets (25 mg total) by mouth every other day. (Patient not taking: Reported on 02/05/2016) 30 tablet 5  . vitamin C (ASCORBIC ACID) 500 MG tablet Take 500 mg by mouth daily. Reported on 02/05/2016     No current facility-administered medications on file prior to visit.    Allergies  Allergen Reactions  . Latex     Itching and redness    Family History  Problem Relation Age of Onset  . Hypertension Mother   . Brain cancer Father     brain tumor, died before patient was born  . Breast cancer Maternal Aunt     several aunts  . Diabetes Sister   . Colon cancer Neg Hx   . Esophageal cancer Neg Hx   .  Rectal cancer Neg Hx   . Stomach cancer Neg Hx   . Other Neg Hx     hypogonadism    BP 122/80 mmHg  Pulse 65  Ht 5\' 9"  (1.753 m)  Wt 175 lb (79.379 kg)  BMI 25.83 kg/m2  SpO2 95%   Review of Systems denies depression, numbness, weight change, decreased urinary stream, gynecomastia, fever, headache, easy bruising, sob, rash, blurry vision, rhinorrhea, and chest pain.  He has mild ED sxs and arthralgias.       Objective:   Physical Exam VS: see vs page GEN: no distress HEAD: head: no deformity eyes: no periorbital swelling, no proptosis external nose and ears are normal mouth: no lesion seen NECK: supple, thyroid is not enlarged CHEST WALL: no deformity LUNGS: clear to auscultation BREASTS:  No gynecomastia CV: reg rate and rhythm, no murmur ABD: abdomen is soft, nontender.  no hepatosplenomegaly.  not distended.  no hernia GENITALIA:  Normal male.   MUSCULOSKELETAL: muscle bulk and strength are grossly normal.  no  obvious joint swelling.  gait is normal and steady EXTEMITIES: no deformity.  no edema.   PULSES: no carotid bruit NEURO:  cn 2-12 grossly intact.   readily moves all 4's.  sensation is intact to touch on all 4's.  SKIN:  Normal texture and temperature.  No rash or suspicious lesion is visible.  Normal hair distribution.   NODES:  None palpable at the neck.   PSYCH: alert, well-oriented.  Does not appear anxious nor depressed.    Lab Results  Component Value Date   PSA 1.94 01/31/2016   PSA 1.72 07/31/2014   PSA 0.91 11/20/2008   Lab Results  Component Value Date   TESTOSTERONE 340* 02/05/2016   I have reviewed outside records, and summarized: Pt was noted to have hypogonadism, and referred here.  Risk of VTE was discussed with patient.       Assessment & Plan:  Hypogonadism, new to me.  uncertain etiology, but central etiology is presumed due to response to clomid.   Lingering effect of it may account for today's normal result.  He should have a trial off it.    Patient is advised the following: Patient Instructions  Testosterone treatment has risks, including increased or decreased fertility (depending on the type of treatment), hair loss, prostate cancer, benign prostate enlargement, blood clots, liver problems, lower hdl ("good cholesterol"), polycythemia (opposite of anemia), sleep apnea, and behavior changes.   blood tests are requested for you today.  We'll let you know about the results.   Based on the results, you may need to go back to the clomiphene.    Renato Shin, MD

## 2016-02-05 NOTE — Patient Instructions (Addendum)
Testosterone treatment has risks, including increased or decreased fertility (depending on the type of treatment), hair loss, prostate cancer, benign prostate enlargement, blood clots, liver problems, lower hdl ("good cholesterol"), polycythemia (opposite of anemia), sleep apnea, and behavior changes.   blood tests are requested for you today.  We'll let you know about the results.   Based on the results, you may need to go back to the clomiphene.

## 2016-02-06 LAB — PROLACTIN: Prolactin: 4.6 ng/mL (ref 2.0–18.0)

## 2016-02-06 LAB — TESTOSTERONE,FREE AND TOTAL
TESTOSTERONE FREE: 17.6 pg/mL (ref 7.2–24.0)
TESTOSTERONE: 340 ng/dL — AB (ref 348–1197)

## 2016-02-07 ENCOUNTER — Encounter: Payer: Self-pay | Admitting: Family Medicine

## 2016-02-07 ENCOUNTER — Ambulatory Visit (INDEPENDENT_AMBULATORY_CARE_PROVIDER_SITE_OTHER): Payer: BLUE CROSS/BLUE SHIELD | Admitting: Family Medicine

## 2016-02-07 VITALS — BP 118/82 | HR 72 | Temp 98.3°F | Ht 68.5 in | Wt 175.0 lb

## 2016-02-07 DIAGNOSIS — Z0000 Encounter for general adult medical examination without abnormal findings: Secondary | ICD-10-CM

## 2016-02-07 DIAGNOSIS — E781 Pure hyperglyceridemia: Secondary | ICD-10-CM

## 2016-02-07 NOTE — Patient Instructions (Signed)
No changes in medication  Doing really well overall  Love the idea of getting weight into 160s

## 2016-02-07 NOTE — Progress Notes (Signed)
Phone: [phone]  Subjective:  Patient presents today for their annual physical. Chief complaint-noted.   See problem oriented charting- ROS- full  review of systems was completed and negative except for: mild tingling intermittently behind left ear, occasional dizziness when gets out of car during longer car rides. No chest pain or shortness of breath. No headache or blurry vision.   The following were reviewed and entered/updated in epic: Past Medical History  Diagnosis Date  . Hyperlipidemia   . Allergy   . Umbilical hernia   . Arthritis    Patient Active Problem List   Diagnosis Date Noted  . Testicular hypofunction 07/29/2009    Priority: Medium  . Hypertriglyceridemia 05/30/2007    Priority: Medium  . ASTHMA 05/30/2007    Priority: Medium  . 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 Hx of umbilical hernia repair 123456    Priority: Low  . SHOULDER IMPINGEMENT SYNDROME, RIGHT 12/16/2009    Priority: Low  . BACK PAIN 11/29/2009    Priority: Low  . ERECTILE DYSFUNCTION, ORGANIC 04/24/2009    Priority: Low  . ARTHRITIS, RIGHT FOOT 04/16/2008    Priority: Low  . HERPES, GENITAL NOS 05/30/2007    Priority: Low  . ALLERGIC RHINITIS 05/30/2007    Priority: Low  . Osteoarthritis 06/04/2015   Past Surgical History  Procedure Laterality Date  . Spermaticeal removal      in [place], [place]  . Umbilical hernia repair  2013    Family History  Problem Relation Age of Onset  . Hypertension Mother   . Brain cancer Father     brain tumor, died before patient was born  . Breast cancer Maternal Aunt     several aunts  . Diabetes Sister   . Colon cancer Neg Hx   . Esophageal cancer Neg Hx   . Rectal cancer Neg Hx   . Stomach cancer Neg Hx   . Other Neg Hx     hypogonadism    Medications- reviewed and updated Current Outpatient Prescriptions  Medication Sig Dispense Refill  . aspirin 81 MG tablet Take 81 mg by mouth daily.    . clomiPHENE  (CLOMID) 50 MG tablet Take 0.5 tablets (25 mg total) by mouth every other day. 30 tablet 5  . fenofibrate 160 MG tablet Take 1 tablet (160 mg total) by mouth daily. 30 tablet 5  . Multiple Vitamin (MULTIVITAMIN) capsule Take 1 capsule by mouth daily.       No current facility-administered medications for this visit.    Allergies-reviewed and updated Allergies  Allergen Reactions  . Latex     Itching and redness    Social History   Social History  . Marital Status: Single    Spouse Name: N/A  . Number of Children: N/A  . Years of Education: N/A   Social History Main Topics  . Smoking status: Never Smoker   . Smokeless tobacco: Never Used  . Alcohol Use: Yes     Comment: rare usage  . Drug Use: No  . Sexual Activity: No   Other Topics Concern  . None   Social History Narrative   Regular exercise      From [place], [place]. Moved [place] in 2002.    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    Lives alone. Single. 2 brothers and a sister    Hobbies: working in yard, gardening, working out    Objective: BP 118/82 mmHg  Pulse 72  Temp(Src) 98.3 F (  36.8 C) (Oral)  Ht 5' 8.5" (1.74 m)  Wt 175 lb (79.379 kg)  BMI 26.22 kg/m2  SpO2 96% Gen: NAD, resting comfortably HEENT: Mucous membranes are moist. Oropharynx normal Neck: no thyromegaly CV: RRR no murmurs rubs or gallops Lungs: CTAB no crackles, wheeze, rhonchi Abdomen: soft/nontender/nondistended/normal bowel sounds. No rebound or guarding.  Rectal: normal tone, normal sized prostate, no masses or tenderness Ext: no edema Skin: warm, dry Neuro: grossly normal, moves all extremities, PERRLA  Assessment/Plan:  55 y.o. male presenting for annual physical.   Health Maintenance counseling: 1. Anticipatory guidance: Patient counseled regarding regular dental exams, eye exams, wearing seatbelts.  2. Risk factor reduction:  Advised patient of need for regular exercise and diet rich and fruits and vegetables to reduce risk of  heart attack and stroke. Weight stable- goal maybe under 170, down to 160.  3. Immunizations/screenings/ancillary studies Immunization History  Administered Date(s) Administered  . Td 08/04/2003  . Tdap 03/14/2014  4. Prostate cancer screening- low risk based off psa trend and rectal exam nocturia 0-1x a night  Lab Results  Component Value Date   PSA 1.94 01/31/2016   PSA 1.72 07/31/2014   PSA 0.91 11/20/2008   5. Colon cancer screening - 02/01/14 with 10 year repeat  Status of chronic or acute concerns  Hypogonadism- managed by Dr. Loanne Drilling now.  Had been off clomid for a few weeks and free testosterone was not suppressed- per notes appears may trial longer trial off then reevaluate- patient has yet to be contacted about results. Advised to call in 1 week if he had not heard yet.  Patient has had at least 6 months of behind left ear having tingling sensation. He also has had some dizziness on trips to see mother in [place] when he gets out of car. He had numbness onto face when on testosterone injections which resolved off. He would like to consider neuroimaging-Mri . Sister pushing. He wants to hold off for now- mother may have some hematologic issues and he wants to address these first. Could also refer to neuro though I strongly doubt these are recurrent TIAs. I think MRI would probably be low yield but am willing to consider as this can be distressing for patient and I do not have clear explanation  Hypertriglyceridemia Hypertriglyceridemia- triglycerides under 200- on fenofibrate alone. Wants to consider coming off meds if can get weight 160165 then recheck labs  Lab Results  Component Value Date   CHOL 169 01/31/2016   HDL 38.20* 01/31/2016   LDLCALC 92 01/31/2016   LDLDIRECT 91.0 04/30/2015   TRIG 195.0* 01/31/2016   TRIG 195.0* 01/31/2016   CHOLHDL 4 01/31/2016       Return in about 6 months (around 08/09/2016) for labs before visit.  Orders Placed This Encounter  Procedures  .  Triglycerides    Standing Status: Future     Number of Occurrences:      Standing Expiration Date: 02/06/2017  . Comprehensive metabolic panel    [place]    Standing Status: Future     Number of Occurrences:      Standing Expiration Date: 02/06/2017   Return precautions advised.   Garret Reddish, MD

## 2016-02-07 NOTE — Progress Notes (Signed)
Pre visit review using our clinic review tool, if applicable. No additional management support is needed unless otherwise documented below in the visit note. 

## 2016-02-07 NOTE — Assessment & Plan Note (Signed)
Hypertriglyceridemia- triglycerides under 200- on fenofibrate alone. Wants to consider coming off meds if can get weight 160165 then recheck labs  Lab Results  Component Value Date   CHOL 169 01/31/2016   HDL 38.20* 01/31/2016   LDLCALC 92 01/31/2016   LDLDIRECT 91.0 04/30/2015   TRIG 195.0* 01/31/2016   TRIG 195.0* 01/31/2016   CHOLHDL 4 01/31/2016

## 2016-02-10 ENCOUNTER — Telehealth: Payer: Self-pay | Admitting: Endocrinology

## 2016-02-10 NOTE — Telephone Encounter (Signed)
PT requests call back regarding lab results.

## 2016-02-11 NOTE — Telephone Encounter (Signed)
I contacted the pt and advised of recent blood tests. Pt wanted to confirm when his next appt should be and he is concerned if he stays off the clomid his testosterone levels will start going low and he will be right back to where he started. Please advise, Thanks!

## 2016-02-11 NOTE — Telephone Encounter (Signed)
How about if you recheck in 1-2 months? Is that ok?

## 2016-02-11 NOTE — Telephone Encounter (Signed)
I contacted the pt and left a vm advising of note below. Requested a call back from the pt to schedule his lab appt.

## 2016-03-09 ENCOUNTER — Other Ambulatory Visit (INDEPENDENT_AMBULATORY_CARE_PROVIDER_SITE_OTHER): Payer: BLUE CROSS/BLUE SHIELD

## 2016-03-09 ENCOUNTER — Other Ambulatory Visit: Payer: Self-pay

## 2016-03-09 DIAGNOSIS — E291 Testicular hypofunction: Secondary | ICD-10-CM | POA: Diagnosis not present

## 2016-03-09 LAB — LUTEINIZING HORMONE: LH: 2.65 m[IU]/mL (ref 1.50–9.30)

## 2016-03-10 LAB — TESTOSTERONE,FREE AND TOTAL
Testosterone, Free: 11.7 pg/mL (ref 7.2–24.0)
Testosterone: 287 ng/dL (ref 264–916)

## 2016-03-10 LAB — PROLACTIN: Prolactin: 3.4 ng/mL (ref 2.0–18.0)

## 2016-03-11 ENCOUNTER — Other Ambulatory Visit: Payer: Self-pay | Admitting: Endocrinology

## 2016-03-12 ENCOUNTER — Encounter: Payer: Self-pay | Admitting: Endocrinology

## 2016-04-23 ENCOUNTER — Encounter: Payer: Self-pay | Admitting: Family Medicine

## 2016-04-23 ENCOUNTER — Ambulatory Visit (INDEPENDENT_AMBULATORY_CARE_PROVIDER_SITE_OTHER): Payer: BLUE CROSS/BLUE SHIELD | Admitting: Family Medicine

## 2016-04-23 DIAGNOSIS — E291 Testicular hypofunction: Secondary | ICD-10-CM

## 2016-04-23 DIAGNOSIS — L810 Postinflammatory hyperpigmentation: Secondary | ICD-10-CM

## 2016-04-23 DIAGNOSIS — L52 Erythema nodosum: Secondary | ICD-10-CM

## 2016-04-23 MED ORDER — PREDNISONE 20 MG PO TABS: ORAL_TABLET | ORAL | 0 refills | Status: DC

## 2016-04-23 MED ORDER — HYDROQUINONE 4 % EX CREA: TOPICAL_CREAM | Freq: Two times a day (BID) | CUTANEOUS | 0 refills | Status: DC

## 2016-04-23 NOTE — Progress Notes (Signed)
Subjective:  Calvin Adams is a 55 y.o. year old very pleasant male patient who presents for/with See problem oriented charting ROS- see  ROS included in HPI as well.   Past Medical History-  Patient Active Problem List   Diagnosis Date Noted  . Testicular hypofunction 07/29/2009    Priority: Medium  . Hypertriglyceridemia 05/30/2007    Priority: Medium  . ASTHMA 05/30/2007    Priority: Medium  . Erythema nodosum 05/30/2007    Priority: Medium  . Post-inflammatory hyperpigmentation 04/23/2016    Priority: Low  . Osteoarthritis 06/04/2015    Priority: Low  . 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 Hx of umbilical hernia repair 123456    Priority: Low  . SHOULDER IMPINGEMENT SYNDROME, RIGHT 12/16/2009    Priority: Low  . BACK PAIN 11/29/2009    Priority: Low  . ERECTILE DYSFUNCTION, ORGANIC 04/24/2009    Priority: Low  . ARTHRITIS, RIGHT FOOT 04/16/2008    Priority: Low  . HERPES, GENITAL NOS 05/30/2007    Priority: Low  . ALLERGIC RHINITIS 05/30/2007    Priority: Low    Medications- reviewed and updated Current Outpatient Prescriptions  Medication Sig Dispense Refill  . aspirin 81 MG tablet Take 81 mg by mouth daily.    . fenofibrate 160 MG tablet Take 1 tablet (160 mg total) by mouth daily. 30 tablet 5  . Multiple Vitamin (MULTIVITAMIN) capsule Take 1 capsule by mouth daily.       Objective: BP 124/80 (BP Location: Left Arm, Patient Position: Sitting, Cuff Size: Normal)   Pulse 75   Temp 98.3 F (36.8 C) (Oral)   Resp 20   Ht 5' 8.5" (1.74 m)   Wt 175 lb 8 oz (79.6 kg)   SpO2 97%   BMI 26.30 kg/m  Gen: NAD, resting comfortably CV: RRR no murmurs rubs or gallops Lungs: CTAB no crackles, wheeze, rhonchi Abdomen: soft/nontender/nondistended/normal bowel sounds. No rebound or guarding.  Ext: no edema, on right ankle above medial malleolus there is 3 x 3 cm raised red nodule that is mildly tender to palpation. Raised about 5 mm.  Skin:  warm, dry Neuro: grossly normal, moves all extremities  Assessment/Plan:  Erythema nodosum S: Patient has a history of idiopathic erythema nodosum. States last treated over 10 years ago- prednisone very helpful. Has been going on since his 60s. Later saw Dr. Arnoldo Morale for it and thought superficial thrombophlebitis. He ended up going to dermatology and on biopsy identified as erythema nodosum. He started Saturday with small lesion on  Right leg above medial malleolus mild to moderate aching. Swells at night. Now about 3 cm in diameter with raised area about 5 mm.  ROS- no fever, chills, nausea, vomiting, recent new medications A/P: Appears to be recurrent erythema nodosum. We reviewed possible causes as had been reviewed in past and thought to be low likelihood and again today likely low likelihood. Will treat with prednisone though discussed potassium iodine and nsaids and patient declined.   Post-inflammatory hyperpigmentation S: seen by dr. Allyson Sabal last year for areas of hyperpigmentation on his chin. Responded well to hydroquinone but have come back A/P: requests refill through our office as was told to follow up in office at derm and states does not have time at present to do so. I did provide a refill today x 1, if recurrent issues or not effective would refer back to dermatology  Testicular hypofunction S: Patient had 2 sets of normal free testosterone levels with  Dr. Loanne Drilling even being off the clomid. Patient very concerned about the fact it was trending down. He started taking some old clomid he had as states started to feel tire.d  A/P: long conversation today- discussed if endocrines opinion to stop- would want another endocrines opinion to restart and that my strong preference would be for them to manage this as it seems to have been somewhat questionable at least on free testosterone side.     Meds ordered this encounter  Medications  . predniSONE (DELTASONE) 20 MG tablet    Sig:  Take 2 pills for 3 days, 1 pill for 4 days    Dispense:  10 tablet    Refill:  0  . hydroquinone 4 % cream    Sig: Apply topically 2 (two) times daily.    Dispense:  28.35 g    Refill:  0    Return precautions advised.  Garret Reddish, MD

## 2016-04-23 NOTE — Assessment & Plan Note (Signed)
S: Patient has a history of idiopathic erythema nodosum. States last treated over 10 years ago- prednisone very helpful. Has been going on since his 69s. Later saw Dr. Arnoldo Morale for it and thought superficial thrombophlebitis. He ended up going to dermatology and on biopsy identified as erythema nodosum. He started Saturday with small lesion on  Right leg above medial malleolus mild to moderate aching. Swells at night. Now about 3 cm in diameter with raised area about 5 mm.  ROS- no fever, chills, nausea, vomiting, recent new medications A/P: Appears to be recurrent erythema nodosum. We reviewed possible causes as had been reviewed in past and thought to be low likelihood and again today likely low likelihood. Will treat with prednisone though discussed potassium iodine and nsaids and patient declined.

## 2016-04-23 NOTE — Assessment & Plan Note (Signed)
S: Patient had 2 sets of normal free testosterone levels with Dr. Loanne Drilling even being off the clomid. Patient very concerned about the fact it was trending down. He started taking some old clomid he had as states started to feel tire.d  A/P: long conversation today- discussed if endocrines opinion to stop- would want another endocrines opinion to restart and that my strong preference would be for them to manage this as it seems to have been somewhat questionable at least on free testosterone side.

## 2016-04-23 NOTE — Patient Instructions (Signed)
Prednisone 7 days for erythema nodosum. Return to care or Dr. Allyson Adams if not improving  Sent in hypopigmentation cream  Happy to help facilitate new endocrinologist appointment if desired  Declined flu shot

## 2016-04-23 NOTE — Progress Notes (Signed)
Pre visit review using our clinic review tool, if applicable. No additional management support is needed unless otherwise documented below in the visit note. 

## 2016-04-23 NOTE — Assessment & Plan Note (Signed)
S: seen by dr. Allyson Sabal last year for areas of hyperpigmentation on his chin. Responded well to hydroquinone but have come back A/P: requests refill through our office as was told to follow up in office at derm and states does not have time at present to do so. I did provide a refill today x 1, if recurrent issues or not effective would refer back to dermatology

## 2016-05-24 ENCOUNTER — Other Ambulatory Visit: Payer: Self-pay | Admitting: Family Medicine

## 2016-05-27 ENCOUNTER — Telehealth: Payer: Self-pay | Admitting: Family Medicine

## 2016-05-27 NOTE — Telephone Encounter (Signed)
Refer to rheumatology under eythema nodosum and let patient know

## 2016-05-27 NOTE — Telephone Encounter (Signed)
Pt states his Erythema nodosum  of his ankle pain has not resolved.  It has flared up again, this time worse than before. Ankle is swollen, tight, nodules are a little larger, and hot to the touch. Pt states he has researched and thinks there may be an underlying cause and pt states he may need to see a rheumatologist.   Would like to know who and what you advise at this time  Ok to leave message.

## 2016-05-28 ENCOUNTER — Other Ambulatory Visit: Payer: Self-pay

## 2016-05-28 DIAGNOSIS — L52 Erythema nodosum: Secondary | ICD-10-CM

## 2016-05-28 NOTE — Telephone Encounter (Signed)
Referral placed and patient is aware. 

## 2016-08-06 ENCOUNTER — Other Ambulatory Visit: Payer: BLUE CROSS/BLUE SHIELD

## 2016-08-11 ENCOUNTER — Ambulatory Visit: Payer: BLUE CROSS/BLUE SHIELD | Admitting: Family Medicine

## 2016-08-17 ENCOUNTER — Other Ambulatory Visit: Payer: BLUE CROSS/BLUE SHIELD

## 2016-08-18 ENCOUNTER — Other Ambulatory Visit (INDEPENDENT_AMBULATORY_CARE_PROVIDER_SITE_OTHER): Payer: BLUE CROSS/BLUE SHIELD

## 2016-08-18 DIAGNOSIS — E781 Pure hyperglyceridemia: Secondary | ICD-10-CM | POA: Diagnosis not present

## 2016-08-18 LAB — TRIGLYCERIDES: Triglycerides: 125 mg/dL (ref 0.0–149.0)

## 2016-08-18 LAB — COMPREHENSIVE METABOLIC PANEL
ALBUMIN: 4.2 g/dL (ref 3.5–5.2)
ALT: 19 U/L (ref 0–53)
AST: 15 U/L (ref 0–37)
Alkaline Phosphatase: 35 U/L — ABNORMAL LOW (ref 39–117)
BILIRUBIN TOTAL: 0.7 mg/dL (ref 0.2–1.2)
BUN: 13 mg/dL (ref 6–23)
CALCIUM: 9 mg/dL (ref 8.4–10.5)
CO2: 27 mEq/L (ref 19–32)
CREATININE: 1.06 mg/dL (ref 0.40–1.50)
Chloride: 107 mEq/L (ref 96–112)
GFR: 77.45 mL/min (ref >60.00)
Glucose, Bld: 83 mg/dL (ref 70–99)
Potassium: 4 mEq/L (ref 3.5–5.1)
SODIUM: 141 meq/L (ref 135–145)
Total Protein: 5.8 g/dL — ABNORMAL LOW (ref 6.0–8.3)

## 2016-08-21 ENCOUNTER — Ambulatory Visit (INDEPENDENT_AMBULATORY_CARE_PROVIDER_SITE_OTHER): Payer: BLUE CROSS/BLUE SHIELD | Admitting: Family Medicine

## 2016-08-21 ENCOUNTER — Encounter: Payer: Self-pay | Admitting: Family Medicine

## 2016-08-21 VITALS — BP 122/82 | HR 68 | Temp 98.2°F | Ht 68.5 in | Wt 177.8 lb

## 2016-08-21 DIAGNOSIS — R0789 Other chest pain: Secondary | ICD-10-CM

## 2016-08-21 DIAGNOSIS — E781 Pure hyperglyceridemia: Secondary | ICD-10-CM

## 2016-08-21 DIAGNOSIS — E291 Testicular hypofunction: Secondary | ICD-10-CM | POA: Diagnosis not present

## 2016-08-21 MED ORDER — FENOFIBRATE 120 MG PO TABS: 120.0000 mg | ORAL_TABLET | Freq: Every day | ORAL | 11 refills | Status: DC

## 2016-08-21 NOTE — Progress Notes (Signed)
Pre visit review using our clinic review tool, if applicable. No additional management support is needed unless otherwise documented below in the visit note. 

## 2016-08-21 NOTE — Assessment & Plan Note (Signed)
S: triglycerides are best they have been in sometime. A/P: Patient wants lowest effective dose- we opted to reduce to 120mg  and follow up in 6 months at [facility]

## 2016-08-21 NOTE — Patient Instructions (Signed)
Reduce fenofibrate to 120mg  since #s look so good. Lets target under 200.   Let me know what you think about referrals for endocrine- you can also call yourself but just let me know who you choose

## 2016-08-21 NOTE — Assessment & Plan Note (Addendum)
S: suspect controlled on Clomid 25mg  every other day. He has left overs that he is using- he understood Dr. Loanne Drilling preferred for him to come off. I advised the same once again unless endocrinology advises differently A/P: extended counseling needed on this topic. I agreed to check testosterone at physical but would not refill medicine - advised to see endocrine. He is going to consider [facility] for a different opinion.  He would strongly prefer to remain on this medication.

## 2016-08-21 NOTE — Progress Notes (Signed)
Subjective:  Calvin Adams is a 56 y.o. year old very pleasant male patient who presents for/with See problem oriented charting ROS- twinge of chest pain as noted below. No shortness of breath, diaphoresis, left arm or neck pain or paresthesias, no nausea   Past Medical History-  Patient Active Problem List   Diagnosis Date Noted  . Testicular hypofunction 07/29/2009    Priority: Medium  . Hypertriglyceridemia 05/30/2007    Priority: Medium  . ASTHMA 05/30/2007    Priority: Medium  . Erythema nodosum 05/30/2007    Priority: Medium  . Post-inflammatory hyperpigmentation 04/23/2016    Priority: Low  . Osteoarthritis 06/04/2015    Priority: Low  . 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 Hx of umbilical hernia repair [id number]    Priority: Low  . SHOULDER IMPINGEMENT SYNDROME, RIGHT 12/16/2009    Priority: Low  . BACK PAIN 11/29/2009    Priority: Low  . ERECTILE DYSFUNCTION, ORGANIC 04/24/2009    Priority: Low  . ARTHRITIS, RIGHT FOOT 04/16/2008    Priority: Low  . HERPES, GENITAL NOS 05/30/2007    Priority: Low  . ALLERGIC RHINITIS 05/30/2007    Priority: Low    Medications- reviewed and updated Current Outpatient Prescriptions  Medication Sig Dispense Refill  . aspirin 81 MG tablet Take 81 mg by mouth daily.    . fenofibrate 120 MG TABS Take 1 tablet (120 mg total) by mouth daily. 30 tablet 11  . hydroquinone 4 % cream Apply topically 2 (two) times daily. 28.35 g 0  . Multiple Vitamin (MULTIVITAMIN) capsule Take 1 capsule by mouth daily.       No current facility-administered medications for this visit.     Objective: BP 122/82   Pulse 68   Temp 98.2 F (36.8 C) (Oral)   Ht 5' 8.5" (1.74 m)   Wt 177 lb 12.8 oz (80.6 kg)   BMI 26.64 kg/m  Gen: NAD, resting comfortably CV: RRR no murmurs rubs or gallops Lungs: CTAB no crackles, wheeze, rhonchi  Ext: no edema Skin: warm, dry Neuro: grossly normal, moves all  extremities  Assessment/Plan:  Hypertriglyceridemia S: triglycerides are best they have been in sometime. A/P: Patient wants lowest effective dose- we opted to reduce to 120mg  and follow up in 6 months at CPE  Testicular hypofunction S: suspect controlled on Clomid 25mg  every other day. He has left overs that he is using- he understood Dr. Loanne Drilling preferred for him to come off. I advised the same once again unless endocrinology advises differently A/P: extended counseling needed on this topic. I agreed to check testosterone at physical but would not refill medicine - advised to see endocrine. [facility].   Atypical chest pain S: Patient with mild twinges of left sided chest pain a few mornings over last week. Takes aspirin and goes away completely. Usually in the morning. Has not had any exertional symptoms at all and is otherwise asymptomatic. Wonders about reflux or gas or msk pain.  A/P: Patient mentioned this in passing as departing his office, brought him back in the room to discuss. Very mild symptoms and no exertional element- we discussed if he has worsening symptoms and particularly exertional symptoms to return to care and we would get baseline EKG. Other return precautions also advised (other symptoms such as SOB)  Meds ordered this encounter  Medications  . fenofibrate 120 MG TABS    Sig: Take 1  tablet (120 mg total) by mouth daily.    Dispense:  30 tablet    Refill:  11   The duration of face-to-face time during this visit was greater than 25 minutes. Greater than 50% of this time was spent in counseling primarily about testosterone and plan for follow up needed with endocrine  Garret Reddish, MD

## 2016-09-23 ENCOUNTER — Telehealth: Payer: Self-pay | Admitting: Family Medicine

## 2016-09-23 NOTE — Telephone Encounter (Signed)
Pt states his Rx  fenofibrate 120 MG TABS  cost is outrageous.and the pharmacy advised pt if dr could change the Rx to 130 mg, it would go back to $10.  Pt used to be on 160 mg. Would like to know if Dr Yong Channel will switch to the 130 mg?  Pt is out of this med and hopes to get a call back asap.  [facility] [address] [place]

## 2016-09-24 MED ORDER — FENOFIBRATE MICRONIZED 130 MG PO CAPS: 130.0000 mg | ORAL_CAPSULE | Freq: Every day | ORAL | 11 refills | Status: DC

## 2016-09-24 NOTE — Telephone Encounter (Signed)
I sent in 130 mg dose. Please inform patient

## 2016-09-24 NOTE — Telephone Encounter (Signed)
Called and spoke to patient who verbalized understanding  

## 2016-10-22 ENCOUNTER — Ambulatory Visit (INDEPENDENT_AMBULATORY_CARE_PROVIDER_SITE_OTHER): Payer: BLUE CROSS/BLUE SHIELD | Admitting: Family Medicine

## 2016-10-22 ENCOUNTER — Encounter: Payer: Self-pay | Admitting: Family Medicine

## 2016-10-22 VITALS — BP 102/60 | HR 69 | Temp 97.7°F | Ht 68.5 in | Wt 176.6 lb

## 2016-10-22 DIAGNOSIS — J329 Chronic sinusitis, unspecified: Secondary | ICD-10-CM | POA: Diagnosis not present

## 2016-10-22 DIAGNOSIS — B9689 Other specified bacterial agents as the cause of diseases classified elsewhere: Secondary | ICD-10-CM

## 2016-10-22 DIAGNOSIS — J4520 Mild intermittent asthma, uncomplicated: Secondary | ICD-10-CM

## 2016-10-22 MED ORDER — AZITHROMYCIN 250 MG PO TABS: ORAL_TABLET | ORAL | 0 refills | Status: DC

## 2016-10-22 MED ORDER — PREDNISONE 20 MG PO TABS: ORAL_TABLET | ORAL | 0 refills | Status: DC

## 2016-10-22 NOTE — Progress Notes (Signed)
PCP: Garret Reddish, MD  Subjective:  Calvin Adams is a 56 y.o. year old very pleasant male patient who presents with sinusitis symptoms including nasal congestion, sinus tenderness starting over the last week. Prior to that had a lingering cough for over a month. Also has had some sore throat and yellow drainage -day of illness:over a month -Symptoms are worsening as of last night even after worsening a week ago -previous treatments: cough drops. Did try one dose of mucinex-D -sick contacts/travel/risks: denies flu exposure or recent new exposure  ROS-denies fever, SOB, NVD, tooth pain. Did have some subjective warmth and then chills/sweats overnight  Pertinent Past Medical History-  Patient Active Problem List   Diagnosis Date Noted  . Testicular hypofunction 07/29/2009    Priority: Medium  . Hypertriglyceridemia 05/30/2007    Priority: Medium  . ASTHMA 05/30/2007    Priority: Medium  . Erythema nodosum 05/30/2007    Priority: Medium  . Post-inflammatory hyperpigmentation 04/23/2016    Priority: Low  . Osteoarthritis 06/04/2015    Priority: Low  . 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 Hx of umbilical hernia repair 53/66/4403    Priority: Low  . SHOULDER IMPINGEMENT SYNDROME, RIGHT 12/16/2009    Priority: Low  . BACK PAIN 11/29/2009    Priority: Low  . ERECTILE DYSFUNCTION, ORGANIC 04/24/2009    Priority: Low  . ARTHRITIS, RIGHT FOOT 04/16/2008    Priority: Low  . HERPES, GENITAL NOS 05/30/2007    Priority: Low  . ALLERGIC RHINITIS 05/30/2007    Priority: Low    Medications- reviewed  Current Outpatient Prescriptions  Medication Sig Dispense Refill  . aspirin 81 MG tablet Take 81 mg by mouth daily.    . fenofibrate micronized (ANTARA) 130 MG capsule Take 1 capsule (130 mg total) by mouth daily before breakfast. 30 capsule 11  . hydroquinone 4 % cream Apply topically 2 (two) times daily. 28.35 g 0  . Multiple Vitamin (MULTIVITAMIN) capsule  Take 1 capsule by mouth daily.       No current facility-administered medications for this visit.     Objective: BP 102/60 (BP Location: Left Arm, Patient Position: Sitting, Cuff Size: Large)   Pulse 69   Temp 97.7 F (36.5 C) (Oral)   Ht 5' 8.5" (1.74 m)   Wt 176 lb 9.6 oz (80.1 kg)   SpO2 98%   BMI 26.46 kg/m  Gen: NAD, resting comfortably HEENT: Turbinates erythematous with yellow drainage, TM normal, pharynx mildly erythematous with no tonsilar exudate or edema, right maxillary sinus tenderness CV: RRR no murmurs rubs or gallops Lungs: CTAB no crackles, wheeze, rhonchi Abdomen: soft/nontender/nondistended/normal bowel sounds. No rebound or guarding.  Ext: no edema Skin: warm, dry, no rash Neuro: grossly normal, moves all extremities  Assessment/Plan:  Sinsusitis Bacterial based on: Symptoms >10 days, double sickening (worse starting a week ago and then again last night) Seems like he had a lingering URI and then developed this sinusitis secondary to that. Luckily his asthma has not flared but the prednisone would help cover if issues.   Treatment: -considered steroid: we opted together to trial this -other symptomatic care with mucinex- dm -Antibiotic indicated: yes  Finally, we reviewed reasons to return to care including if symptoms worsen or persist or new concerns arise (particularly fever or shortness of breath)  Meds ordered this encounter  Medications  . azithromycin (ZITHROMAX) 250 MG tablet    Sig: Take 2 tabs on day 1, then 1 tab daily until  finished    Dispense:  6 tablet    Refill:  0  . predniSONE (DELTASONE) 20 MG tablet    Sig: Take 1 tablet by mouth daily for 5 days, then 1/2 tablet daily for 2 days    Dispense:  6 tablet    Refill:  0    Garret Reddish, MD

## 2016-10-22 NOTE — Progress Notes (Signed)
Pre visit review using our clinic review tool, if applicable. No additional management support is needed unless otherwise documented below in the visit note. 

## 2016-10-22 NOTE — Patient Instructions (Signed)
Sinsusitis Bacterial based on: Symptoms >10 days, double sickening (worse starting a week ago and then again last night)  Treatment: -considered steroid: we opted together to trial this -other symptomatic care with mucinex- dm -Antibiotic indicated: yes  Finally, we reviewed reasons to return to care including if symptoms worsen or persist or new concerns arise (particularly fever or shortness of breath)  Meds ordered this encounter  Medications  . azithromycin (ZITHROMAX) 250 MG tablet    Sig: Take 2 tabs on day 1, then 1 tab daily until finished    Dispense:  6 tablet    Refill:  0  . predniSONE (DELTASONE) 20 MG tablet    Sig: Take 1 tablet by mouth daily for 5 days, then 1/2 tablet daily for 2 days    Dispense:  6 tablet    Refill:  0

## 2016-12-01 ENCOUNTER — Encounter: Payer: Self-pay | Admitting: Family Medicine

## 2016-12-01 ENCOUNTER — Ambulatory Visit (INDEPENDENT_AMBULATORY_CARE_PROVIDER_SITE_OTHER): Payer: BLUE CROSS/BLUE SHIELD | Admitting: Family Medicine

## 2016-12-01 VITALS — BP 112/86 | HR 78 | Temp 98.4°F | Wt 176.8 lb

## 2016-12-01 DIAGNOSIS — L290 Pruritus ani: Secondary | ICD-10-CM | POA: Diagnosis not present

## 2016-12-01 DIAGNOSIS — K6289 Other specified diseases of anus and rectum: Secondary | ICD-10-CM | POA: Diagnosis not present

## 2016-12-01 DIAGNOSIS — L52 Erythema nodosum: Secondary | ICD-10-CM | POA: Diagnosis not present

## 2016-12-01 MED ORDER — MEBENDAZOLE 100 MG PO CHEW: CHEWABLE_TABLET | ORAL | 0 refills | Status: DC

## 2016-12-01 MED ORDER — KETOCONAZOLE 2 % EX CREA: 1.0000 "application " | TOPICAL_CREAM | Freq: Every day | CUTANEOUS | 0 refills | Status: DC

## 2016-12-01 MED ORDER — PREDNISONE 20 MG PO TABS
ORAL_TABLET | ORAL | 0 refills | Status: DC
Start: 2016-12-01 — End: 2017-02-18

## 2016-12-01 NOTE — Progress Notes (Signed)
Pre visit review using our clinic review tool, if applicable. No additional management support is needed unless otherwise documented below in the visit note. 

## 2016-12-01 NOTE — Patient Instructions (Addendum)
For the area near rectum- appears to be potential fungal infection. Trial 2 weeks of cream once a day  Will also cover for potential pinworms with mebendazole.   After you finish the mebendazole- within a few days- start 2 week taper for prednisone for erythema nodosum  If no better in 14-21 days would see Dr. Allyson Sabal- have him send me a copy of notes please    WE NOW OFFER   [facility] FAST TRACK!!!  SAME DAY Appointments for ACUTE CARE  Such as: Sprains, Injuries, cuts, abrasions, rashes, muscle pain, joint pain, back pain Colds, flu, sore throats, headache, allergies, cough, fever  Ear pain, sinus and eye infections Abdominal pain, nausea, vomiting, diarrhea, upset stomach Animal/insect bites  3 Easy Ways to Schedule: Walk-In Scheduling Call in scheduling Mychart Sign-up: [email or web address]

## 2016-12-01 NOTE — Progress Notes (Signed)
Subjective:  Calvin Adams is a 56 y.o. year old very pleasant male patient who presents for/with See problem oriented charting ROS- No chest pain or shortness of breath. No headache or blurry vision. Does have anal itching and discomfort. Red nodules on shins.    Past Medical History-  Patient Active Problem List   Diagnosis Date Noted  . Testicular hypofunction 07/29/2009    Priority: Medium  . Hypertriglyceridemia 05/30/2007    Priority: Medium  . Asthma 05/30/2007    Priority: Medium  . Erythema nodosum 05/30/2007    Priority: Medium  . Post-inflammatory hyperpigmentation 04/23/2016    Priority: Low  . Osteoarthritis 06/04/2015    Priority: Low  . 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 Hx of umbilical hernia repair 16/01/3709    Priority: Low  . SHOULDER IMPINGEMENT SYNDROME, RIGHT 12/16/2009    Priority: Low  . BACK PAIN 11/29/2009    Priority: Low  . ERECTILE DYSFUNCTION, ORGANIC 04/24/2009    Priority: Low  . ARTHRITIS, RIGHT FOOT 04/16/2008    Priority: Low  . HERPES, GENITAL NOS 05/30/2007    Priority: Low  . ALLERGIC RHINITIS 05/30/2007    Priority: Low    Medications- reviewed and updated Current Outpatient Prescriptions  Medication Sig Dispense Refill  . aspirin 81 MG tablet Take 81 mg by mouth daily.    . fenofibrate micronized (ANTARA) 130 MG capsule Take 1 capsule (130 mg total) by mouth daily before breakfast. 30 capsule 11  . hydroquinone 4 % cream Apply topically 2 (two) times daily. 28.35 g 0  . Multiple Vitamin (MULTIVITAMIN) capsule Take 1 capsule by mouth daily.       No current facility-administered medications for this visit.     Objective: BP 112/86 (BP Location: Left Arm, Patient Position: Sitting, Cuff Size: Normal)   Pulse 78   Temp 98.4 F (36.9 C) (Oral)   Wt 176 lb 12.8 oz (80.2 kg)   SpO2 96%   BMI 26.49 kg/m  Gen: NAD, resting comfortably CV: RRR no murmurs rubs or gallops Lungs: CTAB no crackles,  wheeze, rhonchi Ext: no edema, there are 5 raised erythematous areas Skin: warm, dry, rash as noted above Neuro: grossly normal, moves all extremities Rectal- in crevices and grooves there is some rawness and erythema.  Assessment/Plan:  Rectal irritation Rectal itching S: 2 years ago noted an oozy weepy place at top of buttocks- powder seemed to help. For at least 6 months has had recurrent issues though closer to his rectum. Right around rectum this time and painful to use rectum- skin seems to be inflammed. Mild to moderate aching particularly after bowel movements.  Uses wet wipes but no new products recentlyh. No fevers. No expanding redness. No constipation lately- daily BM and soft. Last active 8 years ago. No other prior similar symptoms. Describes intense itching at night as well. No recent new medications. Does not feel systemically ill.  A/P: due to nighttime itching patient worried about pinworms. I do not think this is a strong probability but also do not have testing ability here- we agreed to 2 dose trial of mebendazole. I am more concerned about fungal infection and will use ketoconazole cream for 2-3 weeks. If he does not improve in 3 weeks- he agrees to follow up with Dr. Allyson Sabal.   We discussed chance of herpetic cause but no vesicles found. Last sexually active 8 years ago and no recent anal intercourse- also prior irritation 2 years ago without  vesicles that he is aware of.   Erythema nodosum S: left leg with 4 red inflammed area similar to prior erythema nodosum. Started about 1-2 weeks ago. Need to get records. Prednisone didn't help last time.  A/P: we will trial longer prednisone taper. Will also get records from [facility]. Could consider alternate therapies from prior notes.   Meds ordered this encounter  Medications  . ketoconazole (NIZORAL) 2 % cream    Sig: Apply 1 application topically daily. For at least 2 weeks    Dispense:  30 g    Refill:  0  . predniSONE  (DELTASONE) 20 MG tablet    Sig: Take 2 pills for 3 days, 1 pill for 3 days, 1/2 pill for 3 days, 1/2 pill every other day until finished    Dispense:  12 tablet    Refill:  0  . mebendazole (VERMOX) 100 MG chewable tablet    Sig: Take one tablet today and repeat in 2 weeks.    Dispense:  2 tablet    Refill:  0   Return precautions advised.  Garret Reddish, MD

## 2016-12-01 NOTE — Assessment & Plan Note (Signed)
S: left leg with 4 red inflammed area similar to prior erythema nodosum. Started about 1-2 weeks ago. Need to get records. Prednisone didn't help last time.  A/P: we will trial longer prednisone taper. Will also get records from [facility]. Could consider alternate therapies from prior notes.

## 2016-12-03 ENCOUNTER — Telehealth: Payer: Self-pay

## 2016-12-03 NOTE — Telephone Encounter (Signed)
Received Pa request from pharmacy for [facility]. PA submitted & is pending. Key: [id number]

## 2016-12-08 NOTE — Telephone Encounter (Signed)
PA approved & form faxed back to pharmacy.  

## 2016-12-24 ENCOUNTER — Other Ambulatory Visit: Payer: Self-pay | Admitting: Family Medicine

## 2016-12-25 NOTE — Telephone Encounter (Signed)
He is supposed to see endocrine for this- he had [facility].

## 2016-12-30 NOTE — Telephone Encounter (Signed)
Spoke with patient who states he has not made an appointment with Endocrine yet. He didn't like his previous Endocrinologist. He states he has a physical coming up and was going to see where his level is then. He was wanting this prescription to help "boost his testosterone." He state she has some of this medication at home and he doesn't need this filled right now. He will wait until the physical.

## 2016-12-30 NOTE — Telephone Encounter (Signed)
Called and left a voicemail message asking for a return phone call 

## 2016-12-31 ENCOUNTER — Other Ambulatory Visit: Payer: Self-pay | Admitting: Family Medicine

## 2017-02-08 ENCOUNTER — Other Ambulatory Visit (INDEPENDENT_AMBULATORY_CARE_PROVIDER_SITE_OTHER): Payer: BLUE CROSS/BLUE SHIELD

## 2017-02-08 DIAGNOSIS — Z125 Encounter for screening for malignant neoplasm of prostate: Secondary | ICD-10-CM

## 2017-02-08 DIAGNOSIS — E781 Pure hyperglyceridemia: Secondary | ICD-10-CM | POA: Diagnosis not present

## 2017-02-08 DIAGNOSIS — E291 Testicular hypofunction: Secondary | ICD-10-CM | POA: Diagnosis not present

## 2017-02-08 LAB — CBC WITH DIFFERENTIAL/PLATELET
Basophils Absolute: 0.1 10*3/uL (ref 0.0–0.1)
Basophils Relative: 1.2 % (ref 0.0–3.0)
EOS PCT: 8.5 % — AB (ref 0.0–5.0)
Eosinophils Absolute: 0.5 10*3/uL (ref 0.0–0.7)
HCT: 42.3 % (ref 39.0–52.0)
Hemoglobin: 14.6 g/dL (ref 13.0–17.0)
LYMPHS ABS: 0.8 10*3/uL (ref 0.7–4.0)
Lymphocytes Relative: 14.9 % (ref 12.0–46.0)
MCHC: 34.5 g/dL (ref 30.0–36.0)
MCV: 91.3 fl (ref 78.0–100.0)
MONO ABS: 0.6 10*3/uL (ref 0.1–1.0)
Monocytes Relative: 10.1 % (ref 3.0–12.0)
NEUTROS PCT: 65.3 % (ref 43.0–77.0)
Neutro Abs: 3.6 10*3/uL (ref 1.4–7.7)
PLATELETS: 184 10*3/uL (ref 150.0–400.0)
RBC: 4.64 Mil/uL (ref 4.22–5.81)
RDW: 12.8 % (ref 11.5–15.5)
WBC: 5.5 10*3/uL (ref 4.0–10.5)

## 2017-02-08 LAB — LIPID PANEL
CHOLESTEROL: 154 mg/dL (ref 0–200)
HDL: 39 mg/dL — ABNORMAL LOW (ref >39.00)
LDL Cholesterol: 91 mg/dL (ref 0–99)
NonHDL: 114.7
Total CHOL/HDL Ratio: 4
Triglycerides: 120 mg/dL (ref 0.0–149.0)
VLDL: 24 mg/dL (ref 0.0–40.0)

## 2017-02-08 LAB — COMPREHENSIVE METABOLIC PANEL
ALK PHOS: 49 U/L (ref 39–117)
ALT: 20 U/L (ref 0–53)
AST: 17 U/L (ref 0–37)
Albumin: 4.3 g/dL (ref 3.5–5.2)
BUN: 16 mg/dL (ref 6–23)
CO2: 26 mEq/L (ref 19–32)
Calcium: 9.1 mg/dL (ref 8.4–10.5)
Chloride: 107 mEq/L (ref 96–112)
Creatinine, Ser: 1.1 mg/dL (ref 0.40–1.50)
GFR: 74.07 mL/min (ref >60.00)
GLUCOSE: 96 mg/dL (ref 70–99)
POTASSIUM: 4.2 meq/L (ref 3.5–5.1)
SODIUM: 141 meq/L (ref 135–145)
TOTAL PROTEIN: 6.2 g/dL (ref 6.0–8.3)
Total Bilirubin: 0.6 mg/dL (ref 0.2–1.2)

## 2017-02-08 LAB — POC URINALSYSI DIPSTICK (AUTOMATED)
Bilirubin, UA: NEGATIVE
Blood, UA: NEGATIVE
Glucose, UA: NEGATIVE
Ketones, UA: NEGATIVE
LEUKOCYTES UA: NEGATIVE
NITRITE UA: NEGATIVE
PROTEIN UA: NEGATIVE
Spec Grav, UA: 1.025 (ref 1.010–1.025)
UROBILINOGEN UA: 0.2 U/dL
pH, UA: 6 (ref 5.0–8.0)

## 2017-02-08 LAB — PSA: PSA: 2.12 ng/mL (ref 0.10–4.00)

## 2017-02-09 LAB — TESTOSTERONE, FREE AND TOTAL (INCLUDES SHBG)-(MALES)
Sex Hormone Binding: 26 nmol/L (ref 10–50)
TESTOSTERONE-% FREE: 2.2 % (ref 1.6–2.9)
Testosterone, Free: 68.6 pg/mL (ref 47.0–244.0)
Testosterone: 305 ng/dL (ref 250–827)

## 2017-02-11 ENCOUNTER — Other Ambulatory Visit: Payer: BLUE CROSS/BLUE SHIELD

## 2017-02-18 ENCOUNTER — Ambulatory Visit (INDEPENDENT_AMBULATORY_CARE_PROVIDER_SITE_OTHER): Payer: BLUE CROSS/BLUE SHIELD | Admitting: Family Medicine

## 2017-02-18 ENCOUNTER — Encounter: Payer: Self-pay | Admitting: Family Medicine

## 2017-02-18 VITALS — BP 100/58 | HR 76 | Temp 98.3°F | Ht 69.0 in | Wt 172.2 lb

## 2017-02-18 DIAGNOSIS — Z0000 Encounter for general adult medical examination without abnormal findings: Secondary | ICD-10-CM

## 2017-02-18 DIAGNOSIS — E291 Testicular hypofunction: Secondary | ICD-10-CM | POA: Diagnosis not present

## 2017-02-18 DIAGNOSIS — E781 Pure hyperglyceridemia: Secondary | ICD-10-CM | POA: Diagnosis not present

## 2017-02-18 DIAGNOSIS — J4520 Mild intermittent asthma, uncomplicated: Secondary | ICD-10-CM

## 2017-02-18 NOTE — Progress Notes (Signed)
Phone: [phone]  Subjective:  Patient presents today for their annual physical. Chief complaint-noted.   See problem oriented charting- ROS- full  review of systems was completed and negative including No chest pain or shortness of breath. No headache or blurry vision. Has had right shoulder pain and some cough  The following were reviewed and entered/updated in epic: Past Medical History:  Diagnosis Date  . Allergy   . Arthritis   . Hyperlipidemia   . Umbilical hernia    Patient Active Problem List   Diagnosis Date Noted  . Testicular hypofunction 07/29/2009    Priority: Medium  . Hypertriglyceridemia 05/30/2007    Priority: Medium  . Asthma 05/30/2007    Priority: Medium  . Erythema nodosum 05/30/2007    Priority: Medium  . Post-inflammatory hyperpigmentation 04/23/2016    Priority: Low  . Osteoarthritis 06/04/2015    Priority: Low  . 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 Hx of umbilical hernia repair 34/19/3790    Priority: Low  . SHOULDER IMPINGEMENT SYNDROME, RIGHT 12/16/2009    Priority: Low  . BACK PAIN 11/29/2009    Priority: Low  . ERECTILE DYSFUNCTION, ORGANIC 04/24/2009    Priority: Low  . ARTHRITIS, RIGHT FOOT 04/16/2008    Priority: Low  . HERPES, GENITAL NOS 05/30/2007    Priority: Low  . ALLERGIC RHINITIS 05/30/2007    Priority: Low   Past Surgical History:  Procedure Laterality Date  . spermaticeal removal     in [place], [place]  . UMBILICAL HERNIA REPAIR  2013    Family History  Problem Relation Age of Onset  . Hypertension Mother   . Brain cancer Father        brain tumor, died before patient was born  . Breast cancer Maternal Aunt        several aunts  . Diabetes Sister   . Colon cancer Neg Hx   . Esophageal cancer Neg Hx   . Rectal cancer Neg Hx   . Stomach cancer Neg Hx   . Other Neg Hx        hypogonadism    Medications- reviewed and updated Current Outpatient Prescriptions  Medication Sig Dispense  Refill  . aspirin 81 MG tablet Take 81 mg by mouth daily.    . fenofibrate micronized (ANTARA) 130 MG capsule Take 1 capsule (130 mg total) by mouth daily before breakfast. 30 capsule 11  . hydroquinone 4 % cream Apply topically 2 (two) times daily. 28.35 g 0  . Multiple Vitamin (MULTIVITAMIN) capsule Take 1 capsule by mouth daily.       No current facility-administered medications for this visit.     Allergies-reviewed and updated Allergies  Allergen Reactions  . Latex     Itching and redness    Social History   Social History  . Marital status: Single    Spouse name: N/A  . Number of children: N/A  . Years of education: N/A   Social History Main Topics  . Smoking status: Never Smoker  . Smokeless tobacco: Never Used  . Alcohol use Yes     Comment: rare usage  . Drug use: No  . Sexual activity: No   Other Topics Concern  . None   Social History Narrative   Regular exercise      From [place], [place]. Moved [employer] in 2002.    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    Lives alone. Single. 2 brothers and a sister    Hobbies:  working in yard, gardening, working out    Objective: BP (!) 100/58 (BP Location: Left Arm, Patient Position: Sitting, Cuff Size: Large)   Pulse 76   Temp 98.3 F (36.8 C) (Oral)   Ht 5\' 9"  (1.753 m)   Wt 172 lb 3.2 oz (78.1 kg)   SpO2 96%   BMI 25.43 kg/m  Gen: NAD, resting comfortably HEENT: Mucous membranes are moist. Oropharynx normal Neck: no thyromegaly CV: RRR no murmurs rubs or gallops Lungs: CTAB no crackles, wheeze, rhonchi Abdomen: soft/nontender/nondistended/normal bowel sounds. No rebound or guarding.  Ext: no edema Skin: warm, dry Neuro: grossly normal, moves all extremities, PERRLA Rectal: normal tone, normal sized prostate, no masses or tenderness  Right Shoulder: Inspection reveals no abnormalities, atrophy or asymmetry. Palpation is normal with no tenderness over AC joint or bicipital groove. ROM is full in all  planes. Rotator cuff strength normal throughout. Signs of impingement with positive Hawkin's tests, empty can. Negative Neer No painful arc and no drop arm sign.  Assessment/Plan:  56 y.o. male presenting for annual physical.  Health Maintenance counseling: 1. Anticipatory guidance: Patient counseled regarding regular dental exams q6 months, eye exams yearly, wearing seatbelts.  2. Risk factor reduction:  Advised patient of need for regular exercise and diet rich and fruits and vegetables to reduce risk of heart attack and stroke. Exercise- feels like he has lost some muscle mass being off testosterone- exercise has been low- using low T as his reason but T is normal on exam. Diet-admits needs to cut down on sweets, but otherwise doing well.  Wt Readings from Last 3 Encounters:  02/18/17 172 lb 3.2 oz (78.1 kg)  12/01/16 176 lb 12.8 oz (80.2 kg)  10/22/16 176 lb 9.6 oz (80.1 kg)  3. Immunizations/screenings/ancillary studies- offered shingrix - may get next year and wants to check with insurance Immunization History  Administered Date(s) Administered  . Td 08/04/2003  . Tdap 03/14/2014  4. Prostate cancer screening- slight increase in PSA - will continue to monitor . Rectal low risk.  Lab Results  Component Value Date   PSA 2.12 02/08/2017   PSA 1.94 01/31/2016   PSA 1.72 07/31/2014   5. Colon cancer screening - 02/01/14 with 10 year repeat planned 6. Skin cancer screening- did see dermatology for rectal exam and just told dermatitis- cream cleared it. [facility] dermatology years ago about 5 for full skin exam. Advised regular sunscreen use. Denies concerning skin lesions. Doesn't wear hat despite bald.   Status of chronic or acute concerns   Right shoulder pain S:Saw ortho years ago and had cortisone shot and did well for years. [facility].  States had arthritis and bone spurs. Right shoulder pain with going overhead or reaching behind him or behind back. At least 4 months of issues.   A/P: suspect rotator cuff tendinopathy. Advise trial aleve twice a day for a week. Also do home exercises after 3 days of aleve. If no better- can follow up with [place] or orthopedics to consider injection  Morning cough with sputum for a few weeks (eosinophils high and talked about likely related to that), left ear pain this AM briefly- ear normal today  Hypertriglyceridemia Triglycerides- look great on fenofibrate  Testicular hypofunction Normal on no therapy. Continue without medication. He feels sluggish but testosterone not the reason- he still would like to consider therapy as he believes he is low normal.   Asthma has not bothered him in sometime other than occasional coughing fits- feeling like needs  good deep breath at night  6 months follow up reasonable or sooner if needed for shoulder  Return precautions advised.  Garret Reddish, MD

## 2017-02-18 NOTE — Assessment & Plan Note (Signed)
Normal on no therapy. Continue without medication. He feels sluggish but testosterone not the reason- he still would like to consider therapy as he believes he is low normal.

## 2017-02-18 NOTE — Patient Instructions (Addendum)
Check with insurance about shoulder injection CPT code 20610  [facility] be same cost here vs. Orthopedics for actual injection - but visit here possibly could be cheaper.   suspect rotator cuff tendinopathy. Advise trial aleve twice a day for a week. Also do home exercises after 3 days of aleve. If no better- can follow up with [place] or orthopedics to consider injection  6 months follow up reasonable or sooner if needed for shoulder

## 2017-02-18 NOTE — Assessment & Plan Note (Signed)
Triglycerides- look great on fenofibrate

## 2017-03-08 ENCOUNTER — Ambulatory Visit (INDEPENDENT_AMBULATORY_CARE_PROVIDER_SITE_OTHER): Payer: BLUE CROSS/BLUE SHIELD | Admitting: Family Medicine

## 2017-03-08 ENCOUNTER — Encounter: Payer: Self-pay | Admitting: Family Medicine

## 2017-03-08 VITALS — BP 98/60 | HR 71 | Temp 98.5°F | Ht 69.0 in | Wt 172.8 lb

## 2017-03-08 DIAGNOSIS — M7551 Bursitis of right shoulder: Secondary | ICD-10-CM | POA: Diagnosis not present

## 2017-03-08 DIAGNOSIS — M25511 Pain in right shoulder: Secondary | ICD-10-CM | POA: Diagnosis not present

## 2017-03-08 MED ORDER — METHYLPREDNISOLONE ACETATE 80 MG/ML IJ SUSP
80.0000 mg | Freq: Once | INTRAMUSCULAR | Status: AC
  Administered 2017-03-08: 80 mg via INTRA_ARTICULAR

## 2017-03-08 NOTE — Patient Instructions (Addendum)
With brisk response to injection this may be bursitis. Hopeful this calms things down enough for you  Would do 3-4 weeks of exercises 3x a week after 3 days in case rotator cuff element.   Usually by 3 days should have at least some improvement- may have some worsening pain tonight. Can use ice 20 minutes 3x a day if so  If symptoms completely return to prior pain level- would follow up with orthopedics.

## 2017-03-08 NOTE — Progress Notes (Signed)
Subjective:  Calvin Adams is a 56 y.o. year old very pleasant male patient who presents for/with See problem oriented charting ROS- no fever or chills, no redness over the shoulder. Does have pain with movement of shoulder.    Past Medical History-  Patient Active Problem List   Diagnosis Date Noted  . Testicular hypofunction 07/29/2009    Priority: Medium  . Hypertriglyceridemia 05/30/2007    Priority: Medium  . Asthma 05/30/2007    Priority: Medium  . Erythema nodosum 05/30/2007    Priority: Medium  . Post-inflammatory hyperpigmentation 04/23/2016    Priority: Low  . Osteoarthritis 06/04/2015    Priority: Low  . 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 Hx of umbilical hernia repair 44/10/4740    Priority: Low  . Bursitis of right shoulder 12/16/2009    Priority: Low  . BACK PAIN 11/29/2009    Priority: Low  . ERECTILE DYSFUNCTION, ORGANIC 04/24/2009    Priority: Low  . ARTHRITIS, RIGHT FOOT 04/16/2008    Priority: Low  . HERPES, GENITAL NOS 05/30/2007    Priority: Low  . ALLERGIC RHINITIS 05/30/2007    Priority: Low    Medications- reviewed and updated Current Outpatient Prescriptions  Medication Sig Dispense Refill  . aspirin 81 MG tablet Take 81 mg by mouth daily.    . fenofibrate micronized (ANTARA) 130 MG capsule Take 1 capsule (130 mg total) by mouth daily before breakfast. 30 capsule 11  . Multiple Vitamin (MULTIVITAMIN) capsule Take 1 capsule by mouth daily.       No current facility-administered medications for this visit.     Objective: BP 98/60 (BP Location: Left Arm, Patient Position: Sitting, Cuff Size: Large)   Pulse 71   Temp 98.5 F (36.9 C) (Oral)   Ht 5\' 9"  (1.753 m)   Wt 172 lb 12.8 oz (78.4 kg)   SpO2 96%   BMI 25.52 kg/m  Gen: NAD, resting comfortably CV: RRR no murmurs rubs or gallops Lungs: CTAB no crackles, wheeze, rhonchi Ext: no edema Skin: warm, dry Neuro: good strength in arm and intact distal  sensation MSK: Right Shoulder exam unchanged from previous largely: Inspection reveals no abnormalities, atrophy or asymmetry. Palpation is normal with no tenderness over AC joint or bicipital groove. ROM is full in all planes. Rotator cuff strength normal throughout. Signs of impingement with positive Hawkin's tests, empty can. Negative Neer painful arc noted (new) and no drop arm sign.  Consent obtained and verified Sterile betadine prep. Furthur cleansed with alcohol. Topical analgesic spray: Ethyl chloride. Joint: right shoulder  Approached in typical fashion with: posterior lateral subacromial injection Completed without difficulty Meds: 1 cc of depomedrol 80mg /cc. 3 cc of lidocaine 2% no epi Needle: 1.5 inch Aftercare instructions and Red flags advised.  Assessment/Plan:  Bursitis of right shoulder Right subacromial bursitis S: At physical patient mentioned right shoulder pain. Apparently had seen orthopedics years ago ([facility]) and injection resolved issues for years. After visit noted in 12/2009 that patient was listed as having "impingement syndrome of right shoulder". We opted to trial aleve but this only seemed to help short term despite week of treatment. Apparently was also told had arthritis and bone spurs. Still with Right shoulder pain with going overhead or reaching behind him or behind back. At least 4 months of issues.   Thought to be rotator cuff tendinopathy A/P:  Patient returns today after lack of improvement with conservative care- we discussed course of prednisone vs injectionand benefits/risks  of injections- he wanted to proceed with injection.  Patient with immediate improvement and almost full resolution after injection today- suspect this may point towards bursitis as cause instead of tendinopathy but we discussed if did not improve that he needed to seek care with [facility] (idea of at least 50% better by 2 weeks was given as firm point of seeking care  if not that improved)   Meds ordered this encounter  Medications  . methylPREDNISolone acetate (DEPO-MEDROL) injection 80 mg   The duration of face-to-face time during this visit was greater than 15 minutes. Greater than 50% of this time was spent in counseling, explanation of diagnosis, planning of further management, and/or coordination of care including discussion of options for treatment of bursitis/possible tendiopathy with focus particularly injection here vs orthopedics.    Return precautions advised.  Garret Reddish, MD

## 2017-03-08 NOTE — Assessment & Plan Note (Addendum)
Right subacromial bursitis S: At physical patient mentioned right shoulder pain. Apparently had seen orthopedics years ago ([facility]) and injection resolved issues for years. After visit noted in 12/2009 that patient was listed as having "impingement syndrome of right shoulder". We opted to trial aleve but this only seemed to help short term despite week of treatment. Apparently was also told had arthritis and bone spurs. Still with Right shoulder pain with going overhead or reaching behind him or behind back. At least 4 months of issues.   Thought to be rotator cuff tendinopathy A/P:  Patient returns today after lack of improvement with conservative care- we discussed course of prednisone vs injectionand benefits/risks of injections- he wanted to proceed with injection.  Patient with immediate improvement and almost full resolution after injection today- suspect this may point towards bursitis as cause instead of tendinopathy but we discussed if did not improve that he needed to seek care with [facility] (idea of at least 50% better by 2 weeks was given as firm point of seeking care if not that improved)

## 2017-03-30 ENCOUNTER — Ambulatory Visit (INDEPENDENT_AMBULATORY_CARE_PROVIDER_SITE_OTHER): Payer: BLUE CROSS/BLUE SHIELD | Admitting: Family Medicine

## 2017-03-30 ENCOUNTER — Encounter: Payer: Self-pay | Admitting: Family Medicine

## 2017-03-30 VITALS — BP 100/86 | HR 62 | Temp 98.2°F | Wt 171.0 lb

## 2017-03-30 DIAGNOSIS — R1033 Periumbilical pain: Secondary | ICD-10-CM | POA: Diagnosis not present

## 2017-03-30 NOTE — Assessment & Plan Note (Signed)
S: umbilical hernia repair around 2013. He is having some discomfort in that area. Has been doing a lot of moving - had furnished rental- lots of heavy lifting. Last night had dehumidifier that was pretty heavy and loaded it out of the car and into garage.   Noted uncomfortable feeling where hernia is repaired.  Pushed in the umbilicus- area felt like a "wire" like he could get a stick from it. Felt like a pinprick to him. Pain up to moderate level. By this morning had largely resolved unless he lays down and pushes on the area and still feels slightly more firm than normal.   Sometimes will feel it in similar area after he does sit ups. Always only mild pain and goes away in 2-3 days.  A/P: Patient was worried about mesh causing strangulation or puncture of his organs. Prior hernia repair umbilicus with mesh in 5465- he admits to reading a lot on internet and that increasing his anxiety. He had called for surgery follow up with Dr. Excell Seltzer but told needed PCP referral first. We had a long discussion about this today. I do not see signs of obvious infection or of serious complication from mesh (his main concern). I offered referral but since he is feeling better also suggested we could hold off and refer if he does not have continued improvement as he usually does when area gets irritated- he agrees and will call back if has recurrent or worsening pain pattern.

## 2017-03-30 NOTE — Progress Notes (Signed)
Subjective:  Calvin Adams is a 56 y.o. year old very pleasant male patient who presents for/with See problem oriented charting ROS-no fevers, chills, fatigue/malaise, nausea/vomiting, or recent weight change. No constipation or diarrhea   Past Medical History-  Patient Active Problem List   Diagnosis Date Noted  . Testicular hypofunction 07/29/2009    Priority: Medium  . Hypertriglyceridemia 05/30/2007    Priority: Medium  . Asthma 05/30/2007    Priority: Medium  . Erythema nodosum 05/30/2007    Priority: Medium  . Post-inflammatory hyperpigmentation 04/23/2016    Priority: Low  . Osteoarthritis 06/04/2015    Priority: Low  . 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 Hx of umbilical hernia repair 24/04/7352    Priority: Low  . Bursitis of right shoulder 12/16/2009    Priority: Low  . BACK PAIN 11/29/2009    Priority: Low  . ERECTILE DYSFUNCTION, ORGANIC 04/24/2009    Priority: Low  . ARTHRITIS, RIGHT FOOT 04/16/2008    Priority: Low  . HERPES, GENITAL NOS 05/30/2007    Priority: Low  . ALLERGIC RHINITIS 05/30/2007    Priority: Low  . Umbilical pain 29/92/4268    Medications- reviewed and updated Current Outpatient Prescriptions  Medication Sig Dispense Refill  . aspirin 81 MG tablet Take 81 mg by mouth daily.    . fenofibrate micronized (ANTARA) 130 MG capsule Take 1 capsule (130 mg total) by mouth daily before breakfast. 30 capsule 11  . Multiple Vitamin (MULTIVITAMIN) capsule Take 1 capsule by mouth daily.       No current facility-administered medications for this visit.     Objective: BP 100/86 (BP Location: Left Arm, Patient Position: Sitting, Cuff Size: Normal)   Pulse 62   Temp 98.2 F (36.8 C) (Oral)   Wt 171 lb (77.6 kg)   SpO2 98%   BMI 25.25 kg/m  Gen: NAD, resting comfortably CV: RRR no murmurs rubs or gallops Lungs: CTAB no crackles, wheeze, rhonchi Abdomen: soft/nontender except for mild pain with palpation inside the  umbilicus (he states it feels like a pinprick or a pin pushing through- in this are I feel some slightly firm subcutaneous tissues which could be scar tissue/nondistended/normal bowel sounds. No rebound or guarding.  Ext: no edema Skin: warm, dry, no rash over umbilicus  Assessment/Plan:  Umbilical pain S: umbilical hernia repair around 2013. He is having some discomfort in that area. Has been doing a lot of moving - had furnished rental- lots of heavy lifting. Last night had dehumidifier that was pretty heavy and loaded it out of the car and into garage.   Noted uncomfortable feeling where hernia is repaired.  Pushed in the umbilicus- area felt like a "wire" like he could get a stick from it. Felt like a pinprick to him. Pain up to moderate level. By this morning had largely resolved unless he lays down and pushes on the area and still feels slightly more firm than normal.   Sometimes will feel it in similar area after he does sit ups. Always only mild pain and goes away in 2-3 days.  A/P: Patient was worried about mesh causing strangulation or puncture of his organs. Prior hernia repair umbilicus with mesh in 3419- he admits to reading a lot on internet and that increasing his anxiety. He had called for surgery follow up with Dr. Excell Seltzer but told needed PCP referral first. We had a long discussion about this today. I do not see signs of obvious infection or  of serious complication from mesh (his main concern). I offered referral but since he is feeling better also suggested we could hold off and refer if he does not have continued improvement as he usually does when area gets irritated- he agrees and will call back if has recurrent or worsening pain pattern.   Return precautions advised.  Garret Reddish, MD

## 2017-03-30 NOTE — Patient Instructions (Signed)
Hold off on referral for now but if symptoms recur or worsen- more than happy to place a referral for you either by phone call or mychart

## 2017-04-30 ENCOUNTER — Telehealth: Payer: Self-pay | Admitting: Family Medicine

## 2017-04-30 NOTE — Telephone Encounter (Signed)
Patient needs referral to [facility] [employer]) for hernia repair. Pt states his hernia has not gotten any better.

## 2017-04-30 NOTE — Telephone Encounter (Signed)
OK to place referral

## 2017-04-30 NOTE — Telephone Encounter (Signed)
Yes thanks may refer 

## 2017-05-03 ENCOUNTER — Other Ambulatory Visit: Payer: Self-pay

## 2017-05-03 DIAGNOSIS — K429 Umbilical hernia without obstruction or gangrene: Secondary | ICD-10-CM

## 2017-05-03 NOTE — Telephone Encounter (Signed)
Referral placed.

## 2017-05-12 ENCOUNTER — Encounter: Payer: Self-pay | Admitting: Family Medicine

## 2017-05-12 ENCOUNTER — Ambulatory Visit (INDEPENDENT_AMBULATORY_CARE_PROVIDER_SITE_OTHER): Payer: BLUE CROSS/BLUE SHIELD | Admitting: Family Medicine

## 2017-05-12 VITALS — BP 110/72 | HR 74 | Temp 98.2°F | Ht 69.0 in | Wt 169.6 lb

## 2017-05-12 DIAGNOSIS — R1011 Right upper quadrant pain: Secondary | ICD-10-CM | POA: Diagnosis not present

## 2017-05-12 DIAGNOSIS — M25549 Pain in joints of unspecified hand: Secondary | ICD-10-CM | POA: Diagnosis not present

## 2017-05-12 LAB — COMPREHENSIVE METABOLIC PANEL
ALK PHOS: 41 U/L (ref 39–117)
ALT: 23 U/L (ref 0–53)
AST: 16 U/L (ref 0–37)
Albumin: 4.4 g/dL (ref 3.5–5.2)
BILIRUBIN TOTAL: 0.7 mg/dL (ref 0.2–1.2)
BUN: 12 mg/dL (ref 6–23)
CALCIUM: 9.5 mg/dL (ref 8.4–10.5)
CO2: 31 mEq/L (ref 19–32)
Chloride: 104 mEq/L (ref 96–112)
Creatinine, Ser: 0.97 mg/dL (ref 0.40–1.50)
GFR: 85.56 mL/min (ref >60.00)
Glucose, Bld: 61 mg/dL — ABNORMAL LOW (ref 70–99)
Potassium: 4.2 mEq/L (ref 3.5–5.1)
Sodium: 141 mEq/L (ref 135–145)
TOTAL PROTEIN: 6.1 g/dL (ref 6.0–8.3)

## 2017-05-12 LAB — CBC WITH DIFFERENTIAL/PLATELET
BASOS PCT: 0.7 % (ref 0.0–3.0)
Basophils Absolute: 0 10*3/uL (ref 0.0–0.1)
EOS PCT: 6.1 % — AB (ref 0.0–5.0)
Eosinophils Absolute: 0.3 10*3/uL (ref 0.0–0.7)
HEMATOCRIT: 43.5 % (ref 39.0–52.0)
Hemoglobin: 14.7 g/dL (ref 13.0–17.0)
LYMPHS PCT: 16.9 % (ref 12.0–46.0)
Lymphs Abs: 0.9 10*3/uL (ref 0.7–4.0)
MCHC: 33.7 g/dL (ref 30.0–36.0)
MCV: 93.4 fl (ref 78.0–100.0)
MONOS PCT: 11.2 % (ref 3.0–12.0)
Monocytes Absolute: 0.6 10*3/uL (ref 0.1–1.0)
NEUTROS ABS: 3.4 10*3/uL (ref 1.4–7.7)
Neutrophils Relative %: 65.1 % (ref 43.0–77.0)
PLATELETS: 180 10*3/uL (ref 150.0–400.0)
RBC: 4.66 Mil/uL (ref 4.22–5.81)
RDW: 13.4 % (ref 11.5–15.5)
WBC: 5.3 10*3/uL (ref 4.0–10.5)

## 2017-05-12 LAB — URIC ACID: Uric Acid, Serum: 3.5 mg/dL — ABNORMAL LOW (ref 4.0–7.8)

## 2017-05-12 LAB — LIPASE: LIPASE: 44 U/L (ref 11.0–59.0)

## 2017-05-12 NOTE — Patient Instructions (Addendum)
Check bloodwork for uric acid  Also check on labs associated with abdominal pain  Trial zantac 150mg  twice a day with meals in case this is reflux (though I do not strongly suspect this)  Could also try some gas-x to see if that helps at all  If no better within a week or worsens- lets go ahead and get that ultrasound. Call us or send [place] a mychart message

## 2017-05-12 NOTE — Progress Notes (Signed)
Subjective:  Calvin Adams is a 56 y.o. year old very pleasant male patient who presents for/with See problem oriented charting ROS- no nausea or vomiting. No chest pain or shortness of breath. Does have RUQ abdominal pain   Past Medical History-  Patient Active Problem List   Diagnosis Date Noted  . Testicular hypofunction 07/29/2009    Priority: Medium  . Hypertriglyceridemia 05/30/2007    Priority: Medium  . Asthma 05/30/2007    Priority: Medium  . Erythema nodosum 05/30/2007    Priority: Medium  . Post-inflammatory hyperpigmentation 04/23/2016    Priority: Low  . Osteoarthritis 06/04/2015    Priority: Low  . 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 Hx of umbilical hernia repair 90/24/0973    Priority: Low  . Bursitis of right shoulder 12/16/2009    Priority: Low  . BACK PAIN 11/29/2009    Priority: Low  . ERECTILE DYSFUNCTION, ORGANIC 04/24/2009    Priority: Low  . ARTHRITIS, RIGHT FOOT 04/16/2008    Priority: Low  . HERPES, GENITAL NOS 05/30/2007    Priority: Low  . ALLERGIC RHINITIS 05/30/2007    Priority: Low  . Umbilical pain 53/29/9242    Medications- reviewed and updated Current Outpatient Prescriptions  Medication Sig Dispense Refill  . fenofibrate micronized (ANTARA) 130 MG capsule Take 1 capsule (130 mg total) by mouth daily before breakfast. 30 capsule 11  . Multiple Vitamin (MULTIVITAMIN) capsule Take 1 capsule by mouth daily.       No current facility-administered medications for this visit.     Objective: BP 110/72 (BP Location: Left Arm, Patient Position: Sitting, Cuff Size: Large)   Pulse 74   Temp 98.2 F (36.8 C) (Oral)   Ht 5\' 9"  (1.753 m)   Wt 169 lb 9.6 oz (76.9 kg)   SpO2 97%   BMI 25.05 kg/m  Gen: NAD, resting comfortably CV: RRR no murmurs rubs or gallops Lungs: CTAB no crackles, wheeze, rhonchi Abdomen: soft/nontender/nondistended- reports puffiness in RUQ but not noted/normal bowel sounds. No rebound or  guarding.  Ext: no edema Skin: warm, dry, no rash over abdomen  Assessment/Plan:  Pain involving joint of finger - Plan: Uric Acid S: 5th finger on left hand slightly red and very painful. Then 3rd finger on right hand was swollen and red and painful. Saw [facility] -given prednisone- tried to tough it out but eventually had to take despite aleve- finished this last Monday. Better for at least a week now after taking the prednisone.   No changes to diet that could have triggered this.  A/P:symptoms resolved now but sounded like possible gout- will check uric acid  RUQ abdominal pain - Plan: CBC with Differential/Platelet, Comprehensive metabolic panel, Lipase  S: right upper quadrant for at least a week. Sometimes feels puffy in the area. Slightly lower energy at the gym the more he did- the more he felt some tension in that rea. Not worse with meals. Better in the AM- seems to get worse as the day goes on.   One day bending/twisting seemed to actually help. BM daily- not hard or straining.   Rates as mild inflammation/discomfort A/P: This could be MSk strain. Could be gallbladder related. From history does not sound liek reflux or gaseous distension but did advise 1 week trial zantac as well as gas-x prn. Patient agrees to try. If not better in a week or worsens or labs lead [place] to it- would get US abdomen. Will get cbc diff, cmp,  lipase to start.   Orders Placed This Encounter  Procedures  . Uric Acid  . CBC with Differential/Platelet  . Comprehensive metabolic panel    [place]  . Lipase   Return precautions advised.  Garret Reddish, MD

## 2017-05-18 ENCOUNTER — Telehealth: Payer: Self-pay | Admitting: Family Medicine

## 2017-05-18 NOTE — Telephone Encounter (Signed)
Can order RUQ abdominal US to evaluate for gallstones under RUQ pain

## 2017-05-18 NOTE — Telephone Encounter (Signed)
Patient calling to state he would like provider to place order for [place] as discussed in last visit. He also states his glucose was checked previously, but he had eaten 45 mins prior and thinks that is why it read high.

## 2017-05-20 ENCOUNTER — Other Ambulatory Visit: Payer: Self-pay

## 2017-05-20 DIAGNOSIS — R1011 Right upper quadrant pain: Secondary | ICD-10-CM

## 2017-05-20 NOTE — Telephone Encounter (Signed)
Order placed

## 2017-05-24 ENCOUNTER — Ambulatory Visit
Admission: RE | Admit: 2017-05-24 | Discharge: 2017-05-24 | Disposition: A | Discharge status: Home / Self Care | Payer: BLUE CROSS/BLUE SHIELD | Source: Ambulatory Visit | Attending: Family Medicine | Admitting: Family Medicine

## 2017-05-24 DIAGNOSIS — R1011 Right upper quadrant pain: Secondary | ICD-10-CM

## 2017-05-25 ENCOUNTER — Telehealth: Payer: Self-pay | Admitting: Family Medicine

## 2017-05-25 NOTE — Telephone Encounter (Signed)
Patient called in reference to ultra sound results from 05/24/17. Please call patient and advise when results available. Patient stated he does not have access to my chart at this time. OK to leave detailed message.

## 2017-05-26 NOTE — Telephone Encounter (Signed)
Patient calling about [place] results. Asking to leave detailed voice mail if he does not answer.

## 2017-05-26 NOTE — Telephone Encounter (Signed)
Called and spoke to patient and reviewed over results

## 2017-06-22 ENCOUNTER — Telehealth: Payer: Self-pay | Admitting: Family Medicine

## 2017-06-22 NOTE — Telephone Encounter (Signed)
Dr. Hunter, please see message and advise. 

## 2017-06-22 NOTE — Telephone Encounter (Signed)
Has been 3 months so could get one but on other hand recurrence right at 3 months- may be worth seeing his orthopedic physician or Dr. Paulla Fore of sports medicine for their opinion

## 2017-06-22 NOTE — Telephone Encounter (Signed)
Patient called in reference to steroid injection in RT shoulder. Patient stated he would like another injection but patient is unsure if it is too early for him to recieve another steroid injection. Please call patient and advise. OK to leave message.

## 2017-06-23 NOTE — Telephone Encounter (Signed)
Called and spoke to patient who states he feels he re-injured the shoulder reaching back for something in his backseat. I am scheduling him for a follow up with Dr. Yong Channel per his request.

## 2017-06-28 ENCOUNTER — Ambulatory Visit: Payer: BLUE CROSS/BLUE SHIELD | Admitting: Family Medicine

## 2017-06-28 ENCOUNTER — Encounter: Payer: Self-pay | Admitting: Family Medicine

## 2017-06-28 VITALS — BP 98/68 | HR 81 | Temp 97.4°F | Ht 69.0 in | Wt 173.8 lb

## 2017-06-28 DIAGNOSIS — M67911 Unspecified disorder of synovium and tendon, right shoulder: Secondary | ICD-10-CM | POA: Diagnosis not present

## 2017-06-28 DIAGNOSIS — M25511 Pain in right shoulder: Secondary | ICD-10-CM | POA: Diagnosis not present

## 2017-06-28 MED ORDER — METHYLPREDNISOLONE ACETATE 80 MG/ML IJ SUSP
80.0000 mg | Freq: Once | INTRAMUSCULAR | Status: AC
  Administered 2017-06-28: 80 mg via INTRA_ARTICULAR

## 2017-06-28 NOTE — Patient Instructions (Signed)
Would do 1 month of exercises 3x a week after 3 days   I thought more bursitis last time but with recurrence suspect your rotator cuff is involved. I want you to keep up your exercises twice a week even after the first month of exercises.   Usually by 3 days should have at least some improvement- may have some worsening pain tonight. Can use ice 20 minutes 3x a day if so  If under 3-4 months of resolution or if symptoms return sooner than that would return to [place]. I am really hoping this lasts longer for you this time

## 2017-06-28 NOTE — Assessment & Plan Note (Signed)
Changing problem today from bursitis right shoulder to rotator cuff tendinopathy  S:  Patient had injection just over 3 months ago for thought to be rotator cuff tendinopathy but had almost immediate relief with injection and thought potential bursitis at that point. He had done well for 2 months or so. Had stopped home exercises- after a month  Reaching with right arm behind him and seemed to flare the shoulder pain up. Has continued to have issues over last 3-4 weeks. Trouble getting shirt off, reaching over head or behind back. Pain At rest 2/10 but can get Up to 8/10. Aleve mild help. No ice or heat tried yet.  A/P: History and exam today concerning for rotator cuff tendinopathy. Injection completed today as above- he did note immediate improvement but not immediate resolution as he had last time. I advised him to go back to exercises as instructed last visit but after a month go down and continue to do at least twice a week. Given this is now 2nd injection in 3-4 month span- I advised him to follow up with [facility] at this point if recurrence particularly before 3-4 months.

## 2017-06-28 NOTE — Progress Notes (Signed)
Subjective:  Calvin Adams is a 56 y.o. year old very pleasant male patient who presents for/with See problem oriented charting ROS- no fever, chills. No chest pain or shortness of breath.    Past Medical History-  Patient Active Problem List   Diagnosis Date Noted  . Testicular hypofunction 07/29/2009    Priority: Medium  . Hypertriglyceridemia 05/30/2007    Priority: Medium  . Asthma 05/30/2007    Priority: Medium  . Erythema nodosum 05/30/2007    Priority: Medium  . Post-inflammatory hyperpigmentation 04/23/2016    Priority: Low  . Osteoarthritis 06/04/2015    Priority: Low  . 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 Hx of umbilical hernia repair 17/61/6073    Priority: Low  . Tendinopathy of right rotator cuff 12/16/2009    Priority: Low  . BACK PAIN 11/29/2009    Priority: Low  . ERECTILE DYSFUNCTION, ORGANIC 04/24/2009    Priority: Low  . ARTHRITIS, RIGHT FOOT 04/16/2008    Priority: Low  . HERPES, GENITAL NOS 05/30/2007    Priority: Low  . ALLERGIC RHINITIS 05/30/2007    Priority: Low  . Umbilical pain 71/01/2693    Medications- reviewed and updated Current Outpatient Medications  Medication Sig Dispense Refill  . fenofibrate micronized (ANTARA) 130 MG capsule Take 1 capsule (130 mg total) by mouth daily before breakfast. 30 capsule 11  . Multiple Vitamin (MULTIVITAMIN) capsule Take 1 capsule by mouth daily.       No current facility-administered medications for this visit.     Objective: BP 98/68 (BP Location: Left Arm, Patient Position: Sitting, Cuff Size: Large)   Pulse 81   Temp (!) 97.4 F (36.3 C) (Oral)   Ht 5\' 9"  (1.753 m)   Wt 173 lb 12.8 oz (78.8 kg)   SpO2 97%   BMI 25.67 kg/m  Gen: NAD, resting comfortably CV: RRR no murmurs rubs or gallops Lungs: CTAB no crackles, wheeze, rhonchi  Shoulder: Inspection reveals no abnormalities, atrophy or asymmetry. Palpation is normal with no tenderness over AC joint or bicipital  groove. ROM is full in all planes but limited by pain- pain starts at 90 degrees abduction and  Rotator cuff strength normal throughout but limited by pain.  No signs of impingement with negative Neer. Did have impingement signs with positive Hawkin's tests, empty can. Normal scapular function observed. no drop arm sign.  Consent obtained and verified (discussed risks verbally) Sterile betadine prep. Furthur cleansed with alcohol. Topical analgesic spray: Ethyl chloride. Joint: right shoulder Approached in typical fashion with: posterior lateral subacromial injection Completed without difficulty Meds: 1 cc of depo medrol 80mg /cc with 3 cc of lidocaine 1% no epinephrine Needle: 1.5 inch Aftercare instructions and Red flags advised.  Assessment/Plan:  Tendinopathy of right rotator cuff Changing problem today from bursitis right shoulder to rotator cuff tendinopathy  S:  Patient had injection just over 3 months ago for thought to be rotator cuff tendinopathy but had almost immediate relief with injection and thought potential bursitis at that point. He had done well for 2 months or so. Had stopped home exercises- after a month  Reaching with right arm behind him and seemed to flare the shoulder pain up. Has continued to have issues over last 3-4 weeks. Trouble getting shirt off, reaching over head or behind back. Pain At rest 2/10 but can get Up to 8/10. Aleve mild help. No ice or heat tried yet.  A/P: History and exam today concerning for rotator cuff tendinopathy.  Injection completed today as above- he did note immediate improvement but not immediate resolution as he had last time. I advised him to go back to exercises as instructed last visit but after a month go down and continue to do at least twice a week. Given this is now 2nd injection in 3-4 month span- I advised him to follow up with [facility] at this point if recurrence particularly before 3-4 months.    Meds ordered this  encounter  Medications  . methylPREDNISolone acetate (DEPO-MEDROL) injection 80 mg   Return precautions advised.  Garret Reddish, MD

## 2017-11-01 ENCOUNTER — Other Ambulatory Visit: Payer: Self-pay | Admitting: Family Medicine

## 2017-11-23 ENCOUNTER — Ambulatory Visit: Payer: BLUE CROSS/BLUE SHIELD | Admitting: Family Medicine

## 2017-11-23 ENCOUNTER — Encounter: Payer: Self-pay | Admitting: Family Medicine

## 2017-11-23 VITALS — BP 116/84 | HR 71 | Temp 98.2°F | Ht 69.0 in | Wt 178.0 lb

## 2017-11-23 DIAGNOSIS — R21 Rash and other nonspecific skin eruption: Secondary | ICD-10-CM | POA: Diagnosis not present

## 2017-11-23 DIAGNOSIS — M8949 Other hypertrophic osteoarthropathy, multiple sites: Secondary | ICD-10-CM

## 2017-11-23 DIAGNOSIS — M150 Primary generalized (osteo)arthritis: Secondary | ICD-10-CM | POA: Diagnosis not present

## 2017-11-23 DIAGNOSIS — M159 Polyosteoarthritis, unspecified: Secondary | ICD-10-CM

## 2017-11-23 MED ORDER — MELOXICAM 15 MG PO TABS: 15.0000 mg | ORAL_TABLET | Freq: Every day | ORAL | 1 refills | Status: DC | PRN

## 2017-11-23 MED ORDER — PREDNISONE 20 MG PO TABS: ORAL_TABLET | ORAL | 0 refills | Status: DC

## 2017-11-23 NOTE — Progress Notes (Signed)
Subjective:  Calvin Adams is a 57 y.o. year old very pleasant male patient who presents for/with See problem oriented charting ROS- see ros embedded below   Past Medical History-  Patient Active Problem List   Diagnosis Date Noted  . Testicular hypofunction 07/29/2009    Priority: Medium  . Osteoarthritis of multiple joints 04/16/2008    Priority: Medium  . Hypertriglyceridemia 05/30/2007    Priority: Medium  . Asthma 05/30/2007    Priority: Medium  . Erythema nodosum 05/30/2007    Priority: Medium  . Post-inflammatory hyperpigmentation 04/23/2016    Priority: Low  . Osteoarthritis 06/04/2015    Priority: Low  . 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 Hx of umbilical hernia repair 63/84/6659    Priority: Low  . Tendinopathy of right rotator cuff 12/16/2009    Priority: Low  . BACK PAIN 11/29/2009    Priority: Low  . ERECTILE DYSFUNCTION, ORGANIC 04/24/2009    Priority: Low  . HERPES, GENITAL NOS 05/30/2007    Priority: Low  . ALLERGIC RHINITIS 05/30/2007    Priority: Low  . Umbilical pain 93/57/0177    Medications- reviewed and updated Current Outpatient Medications  Medication Sig Dispense Refill  . fenofibrate micronized (ANTARA) 130 MG capsule TAKE ONE CAPSULE BY MOUTH DAILY BEFORE BEFORE BREAKFAST 30 capsule 10  . meloxicam (MOBIC) 15 MG tablet Take 1 tablet (15 mg total) by mouth daily as needed for pain. 30 tablet 1  . Multiple Vitamin (MULTIVITAMIN) capsule Take 1 capsule by mouth daily.      . predniSONE (DELTASONE) 20 MG tablet Take 2 pills for 3 days, 1 pill for 6 days, 1/2 pill for 6 days, 1/2 pill every other day until finished 17 tablet 0   No current facility-administered medications for this visit.     Objective: BP 116/84 (BP Location: Left Arm, Patient Position: Sitting, Cuff Size: Normal)   Pulse 71   Temp 98.2 F (36.8 C) (Oral)   Ht 5\' 9"  (1.753 m)   Wt 178 lb (80.7 kg)   SpO2 95%   BMI 26.29 kg/m  Gen: NAD,  resting comfortably CV: RRR no murmurs rubs or gallops Lungs: CTAB no crackles, wheeze, rhonchi Abdomen: soft/nontender/nondistended Ext: no edema Skin: warm, dry. Multiple erythematous papules on right forearm and into right upper along and along right mid axillary line. Some excoriation on forearm Neuro: normal gait and speech  Assessment/Plan:  Rash S:working at [employer] in her yard last Wednesday. Got up the next morning and noticed red welps on arm and right side of his chest. Stayed stable for last 5-6 days. Didn't itch at first- looked like a red welp. Day or two later started itching. Didn't seem to be linear. No fluid or vesicles. No pain. Spots are not increasing.   Wondered about chiggers.  ROS-not ill appearing, no fever/chills. No new medications. Not immunocompromised. No mucus membrane involvement.  A/P: No red flags.  No pain to suggest shingles plus not dermatomal.  This could be contact dermatitis.  This could be insect bite such as triggers.  We discussed topical options.  Patient would prefer to try prednisone-prolonged taper sent in  Osteoarthritis of multiple joints S: Patient with intermittent pain in Feet, shoulders, knees . mobic from orthopedics-now asks me to prescribe A/P: I refilled his Mobic which he uses sparingly.  Did discuss getting bmet and next visit to monitor kidney function .  Prednisone will also likely help the symptoms.  Asked him to  hold off on Mobic until he after he is finished his prednisone   Future Appointments  Date Time Provider [place]  02/28/2018  1:15 PM Marin Olp, MD [facility]    Meds ordered this encounter  Medications  . predniSONE (DELTASONE) 20 MG tablet    Sig: Take 2 pills for 3 days, 1 pill for 6 days, 1/2 pill for 6 days, 1/2 pill every other day until finished    Dispense:  17 tablet    Refill:  0  . meloxicam (MOBIC) 15 MG tablet    Sig: Take 1 tablet (15 mg total) by mouth daily as needed for pain.     Dispense:  30 tablet    Refill:  1    Return precautions advised.  Garret Reddish, MD

## 2017-11-23 NOTE — Patient Instructions (Signed)
Rash of unclear cause- potentially contact dermatitis. Given how extensive it is- lets try a prednisone taper.   Once off prednisone can use mobic intermittently for arthritis. I bet it will feel far better on prednisone as well though

## 2017-11-23 NOTE — Assessment & Plan Note (Addendum)
S: Patient with intermittent pain in Feet, shoulders, knees . mobic from orthopedics-now asks me to prescribe A/P: I refilled his Mobic which he uses sparingly.  Did discuss getting bmet and next visit to monitor kidney function .  Prednisone will also likely help the symptoms.  Asked him to hold off on Mobic until he after he is finished his prednisone

## 2018-01-03 ENCOUNTER — Telehealth: Payer: Self-pay | Admitting: Family Medicine

## 2018-01-03 NOTE — Telephone Encounter (Signed)
Please contact patient to discuss, patient needs to reschedule 02/28/18 appointment also due to Dr. Ansel Bong schedule.

## 2018-01-03 NOTE — Telephone Encounter (Signed)
Copied from [place] [id number]. Topic: Quick Communication - See Telephone Encounter >> Jan 03, 2018  2:27 PM Cleaster Corin, NT wrote: CRM for notification. See Telephone encounter for: 01/03/18.  Pt. Calling wanting to see if he can come in same day as his appt. But at [facility] for labs 02/28/18

## 2018-01-04 NOTE — Telephone Encounter (Signed)
Please advise. This appointment needs to be rescheduled per Dr. Yong Channel, however would like to know in case the patient prefers to do it this way still.

## 2018-01-05 NOTE — Telephone Encounter (Signed)
LVM to reschedule.

## 2018-01-05 NOTE — Telephone Encounter (Signed)
It will depend on what time patient gets rescheduled for. If he gets an early morning slot then he can have labs drawn at the visit. If he is rescheduled to an afternoon slot then please send phone message and we assess lab needs.

## 2018-02-05 IMAGING — US US ABDOMEN LIMITED
1 series · 14 of 25 positions shown · non-contrast
Comparison: 08/26/2006

CLINICAL DATA: Right upper quadrant pain

EXAM:
ULTRASOUND ABDOMEN LIMITED RIGHT UPPER QUADRANT

[Series 1: us abdomen limited · 0.20mm/px · 14 of 48 slices shown]
[im 1/48]
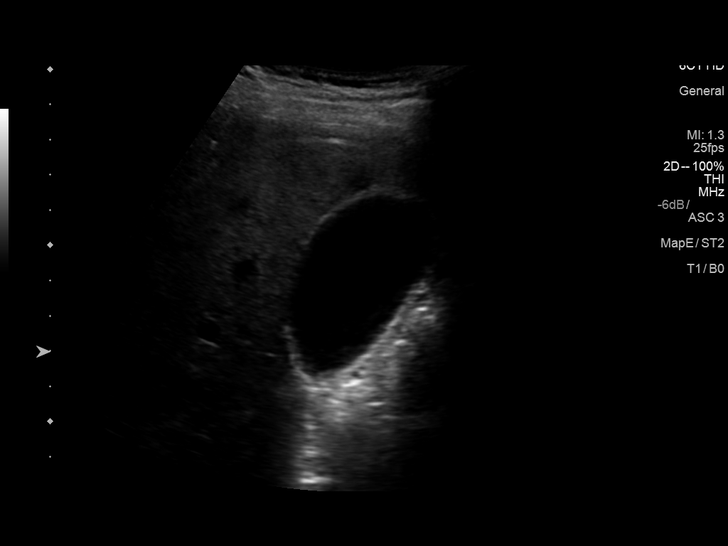
[im 4/48]
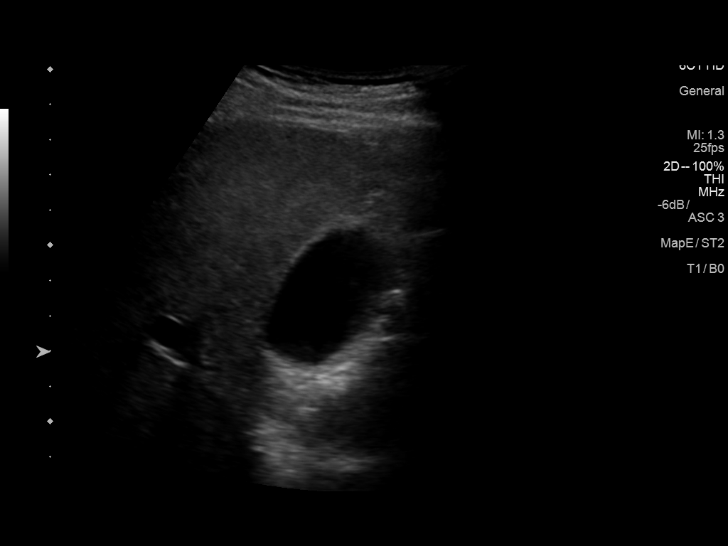
[im 8/48]
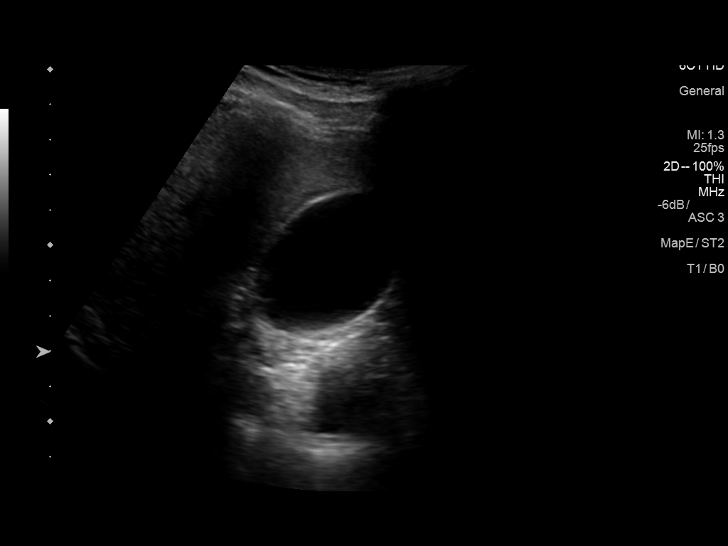
[im 12/48]
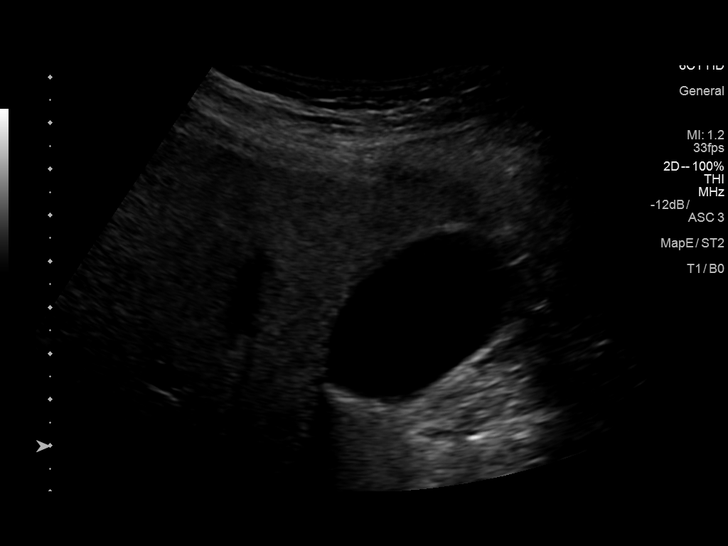
[im 16/48]
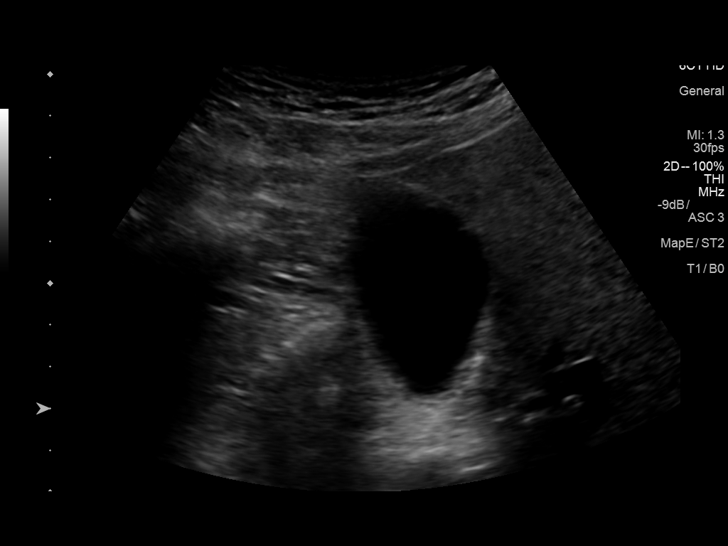
[im 18/48]
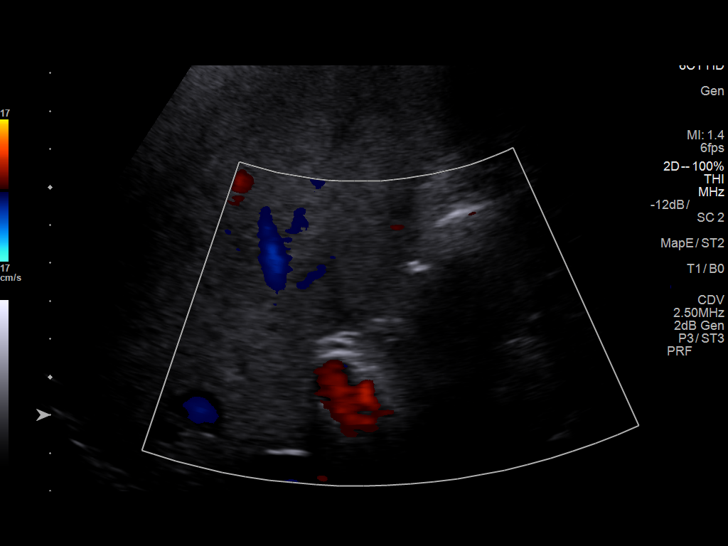
[im 22/48]
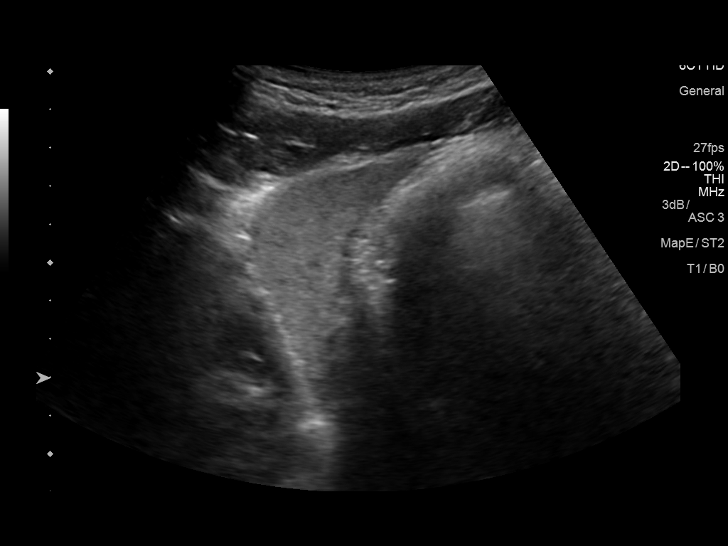
[im 26/48]
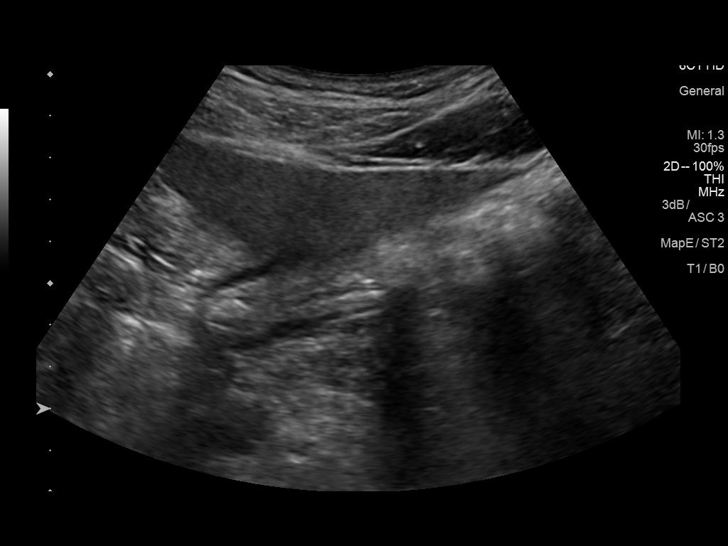
[im 30/48]
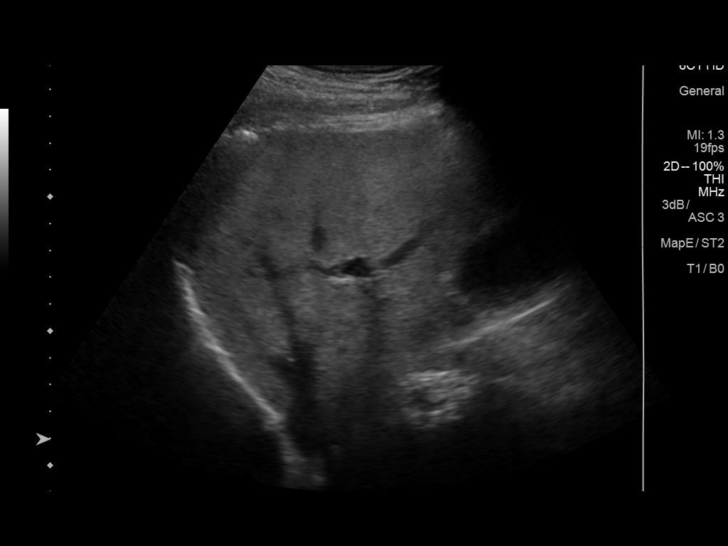
[im 32/48]
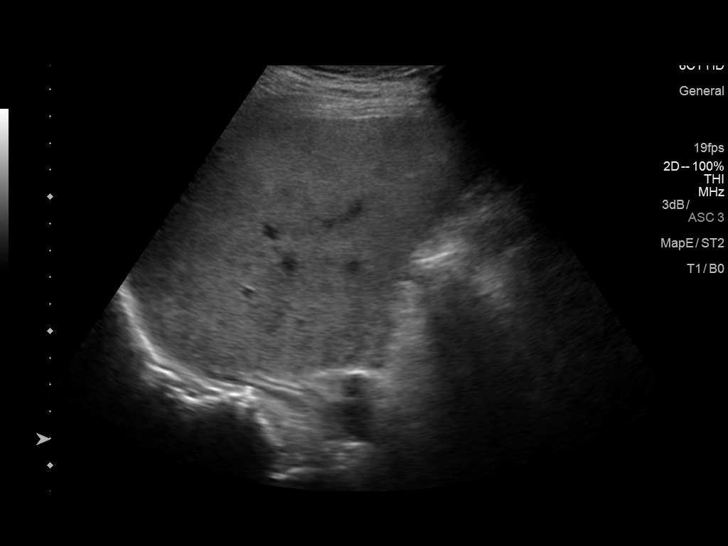
[im 36/48]
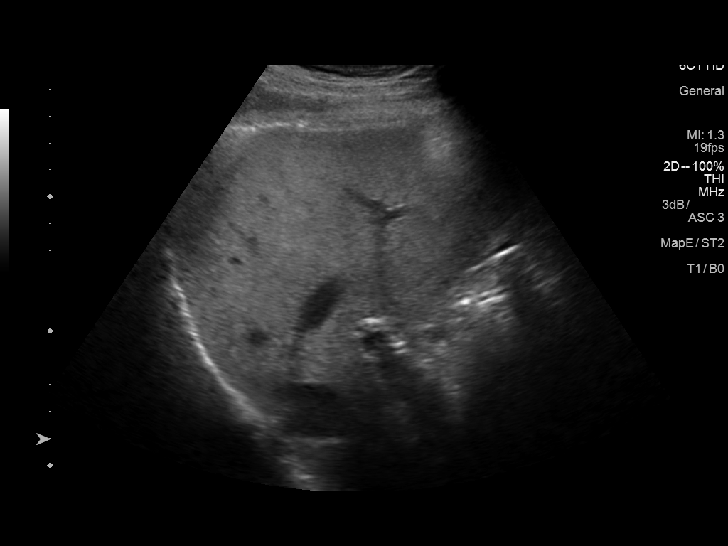
[im 40/48]
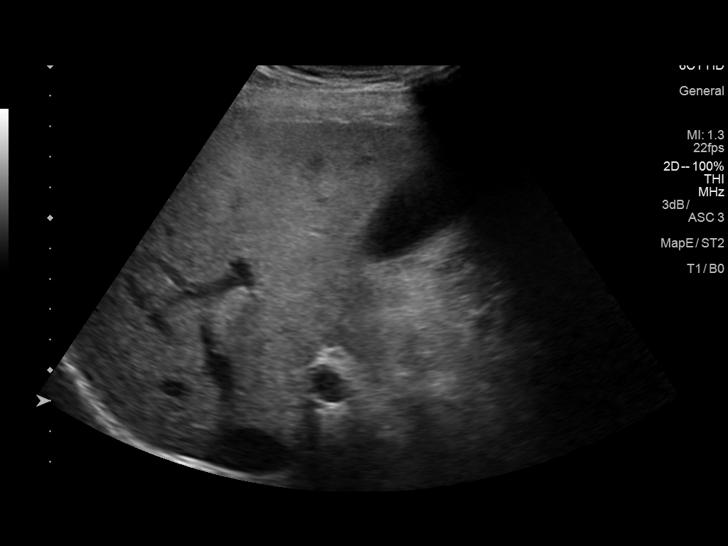
[im 44/48]
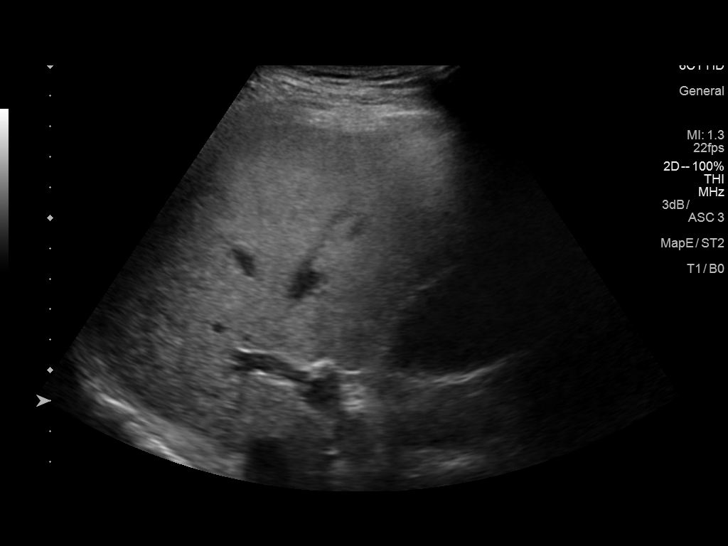
[im 48/48]
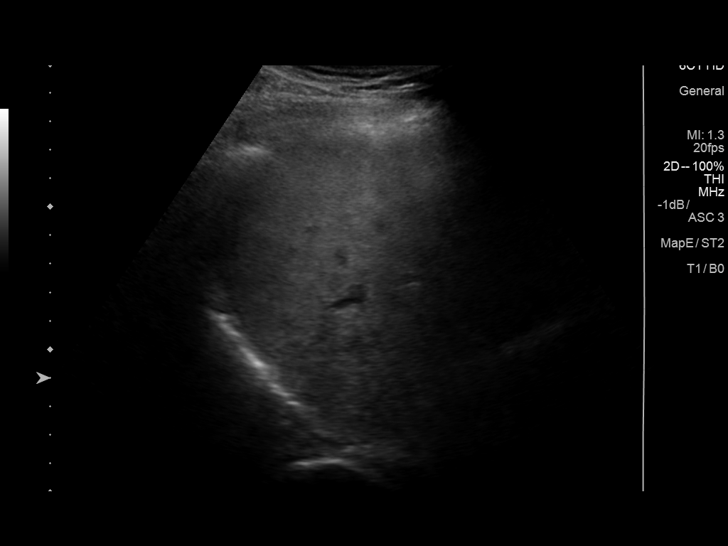

[14 of 25 positions shown; findings below may reference images not displayed]

FINDINGS: Gallbladder:

No gallstones or wall thickening visualized. No sonographic Murphy
sign noted by sonographer.

Common bile duct:

Diameter: 3 mm

Liver:

No focal lesion identified. Within normal limits in parenchymal
echogenicity. Portal vein is patent on color Doppler imaging with
normal direction of blood flow towards the liver.
IMPRESSION: No acute abnormality noted.

## 2018-02-28 ENCOUNTER — Encounter: Payer: BLUE CROSS/BLUE SHIELD | Admitting: Family Medicine

## 2018-03-08 ENCOUNTER — Encounter: Payer: Self-pay | Admitting: Family Medicine

## 2018-03-08 ENCOUNTER — Ambulatory Visit (INDEPENDENT_AMBULATORY_CARE_PROVIDER_SITE_OTHER): Payer: BLUE CROSS/BLUE SHIELD | Admitting: Family Medicine

## 2018-03-08 VITALS — BP 108/68 | HR 82 | Temp 98.4°F | Ht 69.0 in | Wt 171.0 lb

## 2018-03-08 DIAGNOSIS — Z0000 Encounter for general adult medical examination without abnormal findings: Secondary | ICD-10-CM

## 2018-03-08 DIAGNOSIS — E291 Testicular hypofunction: Secondary | ICD-10-CM

## 2018-03-08 DIAGNOSIS — Z79899 Other long term (current) drug therapy: Secondary | ICD-10-CM

## 2018-03-08 DIAGNOSIS — M159 Polyosteoarthritis, unspecified: Secondary | ICD-10-CM

## 2018-03-08 DIAGNOSIS — Z125 Encounter for screening for malignant neoplasm of prostate: Secondary | ICD-10-CM

## 2018-03-08 DIAGNOSIS — E781 Pure hyperglyceridemia: Secondary | ICD-10-CM

## 2018-03-08 MED ORDER — MELOXICAM 15 MG PO TABS: 15.0000 mg | ORAL_TABLET | Freq: Every day | ORAL | 1 refills | Status: DC | PRN

## 2018-03-08 NOTE — Patient Instructions (Addendum)
Please stop by lab before you go  Lets definitely see you in a year.   If you find yourself using the meloxicam everyday would be reasonable to check in 6 months from now to check on kidney function as in the long run this can affect kidney function and we would want to reduce medicine if that happened.

## 2018-03-08 NOTE — Progress Notes (Signed)
Phone: [phone]  Subjective:  Patient presents today for their annual physical. Chief complaint-noted.   See problem oriented charting- ROS- full  review of systems was completed and negative except for: cough, joint pain- TMJ  The following were reviewed and entered/updated in epic: Past Medical History:  Diagnosis Date  . Allergy   . Arthritis   . Hyperlipidemia   . Umbilical hernia    Patient Active Problem List   Diagnosis Date Noted  . Testicular hypofunction 07/29/2009    Priority: Medium  . Osteoarthritis of multiple joints 04/16/2008    Priority: Medium  . Hypertriglyceridemia 05/30/2007    Priority: Medium  . Asthma 05/30/2007    Priority: Medium  . Erythema nodosum 05/30/2007    Priority: Medium  . Post-inflammatory hyperpigmentation 04/23/2016    Priority: Low  . Osteoarthritis 06/04/2015    Priority: Low  . 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 Hx of umbilical hernia repair 56/38/7564    Priority: Low  . Tendinopathy of right rotator cuff 12/16/2009    Priority: Low  . BACK PAIN 11/29/2009    Priority: Low  . ERECTILE DYSFUNCTION, ORGANIC 04/24/2009    Priority: Low  . HERPES, GENITAL NOS 05/30/2007    Priority: Low  . ALLERGIC RHINITIS 05/30/2007    Priority: Low  . Umbilical pain 33/29/5188   Past Surgical History:  Procedure Laterality Date  . spermaticeal removal     in [place], [place]  . UMBILICAL HERNIA REPAIR  2013    Family History  Problem Relation Age of Onset  . Hypertension Mother   . Brain cancer Father        brain tumor, died before patient was born  . Breast cancer Maternal Aunt        several aunts  . Diabetes Sister   . Colon cancer Neg Hx   . Esophageal cancer Neg Hx   . Rectal cancer Neg Hx   . Stomach cancer Neg Hx   . Other Neg Hx        hypogonadism    Medications- reviewed and updated Current Outpatient Medications  Medication Sig Dispense Refill  . fenofibrate micronized (ANTARA)  130 MG capsule TAKE ONE CAPSULE BY MOUTH DAILY BEFORE BEFORE BREAKFAST 30 capsule 10  . meloxicam (MOBIC) 15 MG tablet Take 1 tablet (15 mg total) by mouth daily as needed for pain. 30 tablet 1  . Multiple Vitamin (MULTIVITAMIN) capsule Take 1 capsule by mouth daily.       No current facility-administered medications for this visit.     Allergies-reviewed and updated No Known Allergies  Social History   Social History Narrative   Regular exercise      From [place], [place]. Moved [place] in 2002.    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    Lives alone. Single. 2 brothers and a sister    Hobbies: working in yard, gardening, working out    Objective: BP 108/68 (BP Location: Left Arm, Patient Position: Sitting, Cuff Size: Normal)   Pulse 82   Temp 98.4 F (36.9 C) (Oral)   Ht 5\' 9"  (1.753 m)   Wt 171 lb (77.6 kg)   SpO2 96%   BMI 25.25 kg/m  Gen: NAD, resting comfortably HEENT: Mucous membranes are moist. Oropharynx normal Neck: no thyromegaly CV: RRR no murmurs rubs or gallops Lungs: CTAB no crackles, wheeze, rhonchi Abdomen: soft/nontender/nondistended/normal bowel sounds. No rebound or guarding.  Ext: no edema Skin: warm, dry Neuro: grossly normal, moves  all extremities, PERRLA Rectal: normal tone, normal sized prostate, no masses or tenderness  Assessment/Plan:  57 y.o. male presenting for annual physical.  Health Maintenance counseling: 1. Anticipatory guidance: Patient counseled regarding regular dental exams -q6 months, eye exams - every few years- he is due, wearing seatbelts.  2. Risk factor reduction:  Advised patient of need for regular exercise and diet rich and fruits and vegetables to reduce risk of heart attack and stroke. Weight down 1 lb from last CPE, he states this is due to muscle mass loss- not getting into gym due to joint issues. Exercise- walking every morning. Diet-states could be better- wants to cut down on junk foods (hard with travel for work). Wants  to cut down on his diet sodas.  Wt Readings from Last 3 Encounters:  03/08/18 171 lb (77.6 kg)  11/23/17 178 lb (80.7 kg)  06/28/17 173 lb 12.8 oz (78.8 kg)  3. Immunizations/screenings/ancillary studies- declines flu shot.  Immunization History  Administered Date(s) Administered  . Td 08/04/2003  . Tdap 03/14/2014  . Zoster. States had shingrix x2 at [facility]- we will check this 10/23/2017  4. Prostate cancer screening-  trend psa, prior trend low risk though slight increase last year. Rectal exam low risk  Lab Results  Component Value Date   PSA 2.12 02/08/2017   PSA 1.94 01/31/2016   PSA 1.72 07/31/2014   5. Colon cancer screening - 02/01/14 with 10 year repeat 6. Skin cancer screening- has seen [facility] in past- he is thinking about doing follow up. advised regular sunscreen use. Denies worrisome, changing, or new skin lesions. We did a waist up and knee down exam without obvious precancerous or cancerous lesions  Status of chronic or acute concerns   Flonase helping AM cough- if he misses he wakes up with a cough. Encouraged flonase use OTC  Triglycerides have looked good on fenofibrate- at least 3 days a week. Update today- fasting 6 hours  Didn't end up seeing [facility] for shoulder- he knows he can follow up if needed  History low testosterone- we will update this. He states that prior issues with SOB on testosterone he thinks may have actually been allergies- getting that now sometimes and takes benadryl and resolves after yardwork  Polyarticular osteoarthritis Pain in bilateral shoulders particularly with working out and both feet- likes to have meloxicam on hand- thinks diclofenac has worked better in past so he may call in for that instead. He uses some aleve as well but knows not to use it when taking meloxicam or diclofenac   Future Appointments  Date Time Provider [place]  09/08/2018  8:15 AM [facility]  03/10/2019  8:15 AM Marin Olp, MD [facility]   Return in about 1 year (around 03/09/2019) for physical. Advised 6 months if using meloxicam regularly- right now on 5 days a week with that or aleve so he will come for 6 month bmet.   Lab/Order associations: Preventative health care - Plan: CBC, Comprehensive metabolic panel, Lipid panel, Testos,Total,Free and SHBG (Male), PSA  Testicular hypofunction - Plan: Testos,Total,Free and SHBG (Male)  Polyarticular osteoarthritis - Plan: Basic metabolic panel  Hypertriglyceridemia - Plan: CBC, Comprehensive metabolic panel, Lipid panel  Screening for prostate cancer - Plan: PSA  High risk medication use - Plan: Basic metabolic panel  Meds ordered this encounter  Medications  . meloxicam (MOBIC) 15 MG tablet    Sig: Take 1 tablet (15 mg total) by mouth daily as  needed for pain.    Dispense:  30 tablet    Refill:  1   Return precautions advised.  Garret Reddish, MD

## 2018-03-08 NOTE — Assessment & Plan Note (Signed)
Pain in bilateral shoulders particularly with working out and both feet- likes to have meloxicam on hand- thinks diclofenac has worked better in past so he may call in for that instead. He uses some aleve as well but knows not to use it when taking meloxicam or diclofenac

## 2018-03-09 LAB — CBC
HEMATOCRIT: 43.3 % (ref 39.0–52.0)
Hemoglobin: 14.8 g/dL (ref 13.0–17.0)
MCHC: 34.3 g/dL (ref 30.0–36.0)
MCV: 91.6 fl (ref 78.0–100.0)
Platelets: 217 10*3/uL (ref 150.0–400.0)
RBC: 4.72 Mil/uL (ref 4.22–5.81)
RDW: 12.7 % (ref 11.5–15.5)
WBC: 4.9 10*3/uL (ref 4.0–10.5)

## 2018-03-09 LAB — COMPREHENSIVE METABOLIC PANEL
ALBUMIN: 4.7 g/dL (ref 3.5–5.2)
ALT: 18 U/L (ref 0–53)
AST: 16 U/L (ref 0–37)
Alkaline Phosphatase: 47 U/L (ref 39–117)
BUN: 15 mg/dL (ref 6–23)
CALCIUM: 9.9 mg/dL (ref 8.4–10.5)
CHLORIDE: 103 meq/L (ref 96–112)
CO2: 29 mEq/L (ref 19–32)
CREATININE: 1.01 mg/dL (ref 0.40–1.50)
GFR: 81.41 mL/min (ref >60.00)
Glucose, Bld: 86 mg/dL (ref 70–99)
Potassium: 4.1 mEq/L (ref 3.5–5.1)
Sodium: 140 mEq/L (ref 135–145)
Total Bilirubin: 1 mg/dL (ref 0.2–1.2)
Total Protein: 6.5 g/dL (ref 6.0–8.3)

## 2018-03-09 LAB — LIPID PANEL
CHOL/HDL RATIO: 4
CHOLESTEROL: 178 mg/dL (ref 0–200)
HDL: 42.4 mg/dL (ref >39.00)
NonHDL: 135.94
TRIGLYCERIDES: 232 mg/dL — AB (ref 0.0–149.0)
VLDL: 46.4 mg/dL — ABNORMAL HIGH (ref 0.0–40.0)

## 2018-03-09 LAB — PSA: PSA: 1.94 ng/mL (ref 0.10–4.00)

## 2018-03-09 LAB — LDL CHOLESTEROL, DIRECT: Direct LDL: 112 mg/dL

## 2018-03-12 LAB — TESTOS,TOTAL,FREE AND SHBG (FEMALE)
FREE TESTOSTERONE: 30.6 pg/mL — AB (ref 35.0–155.0)
Sex Hormone Binding: 33 nmol/L (ref 10–50)
TESTOSTERONE, TOTAL, LC-MS-MS: 243 ng/dL — AB (ref 250–1100)

## 2018-03-16 ENCOUNTER — Telehealth: Payer: Self-pay | Admitting: Family Medicine

## 2018-03-16 NOTE — Telephone Encounter (Signed)
Charted in result notes. 

## 2018-03-16 NOTE — Telephone Encounter (Signed)
Copied from [place] [phone]. Topic: Quick Communication - Lab Results >> Mar 14, 2018  2:10 PM Harriett Rush, [place] wrote: Called patient to inform them of 03/08/2018 lab results. When patient returns call, triage nurse may disclose results.

## 2018-03-17 ENCOUNTER — Other Ambulatory Visit (INDEPENDENT_AMBULATORY_CARE_PROVIDER_SITE_OTHER): Payer: BLUE CROSS/BLUE SHIELD

## 2018-03-17 ENCOUNTER — Other Ambulatory Visit: Payer: Self-pay | Admitting: Family Medicine

## 2018-03-17 DIAGNOSIS — E291 Testicular hypofunction: Secondary | ICD-10-CM | POA: Diagnosis not present

## 2018-03-20 LAB — TESTOS,TOTAL,FREE AND SHBG (FEMALE)
Free Testosterone: 63 pg/mL (ref 35.0–155.0)
Sex Hormone Binding: 29 nmol/L (ref 10–50)
Testosterone, Total, LC-MS-MS: 344 ng/dL (ref 250–1100)

## 2018-04-11 ENCOUNTER — Telehealth: Payer: Self-pay | Admitting: Family Medicine

## 2018-04-11 NOTE — Telephone Encounter (Signed)
Copied from [place] [id number]. Topic: Bill or Statement - Patient/Guarantor Inquiry >> Apr 11, 2018  2:06 PM Blase Mess A wrote: CRM for notification. See Telephone encounter for: 04/11/18.  Patient is calling regarding his billing on his 03/08/18 CPE on this day his testerone tested low on that visit.  They called him to come back to have the testertone check a 2nd time on 03/17/18.  Please advise.  The date of service of his bill is 03/08/18 from [facility].  He called [facility] and they advised him to f/u with his doctors office.  The Codes listed are: [phone], [phone], [phone].  Please advise [place] call back number is [phone]

## 2018-04-11 NOTE — Telephone Encounter (Signed)
Please see note.

## 2018-04-15 NOTE — Telephone Encounter (Signed)
Pt is calling back checking on cpe coding . Pt has received second billing about testosterone charges

## 2018-04-15 NOTE — Telephone Encounter (Signed)
I spoke to patient about billing question. I will ask for the 1st testosterone lab to be client billed to our office. I have reached out to [facility] for help with billing issue. I will call patient back after I receive response.

## 2018-06-22 NOTE — Telephone Encounter (Signed)
See note

## 2018-06-22 NOTE — Telephone Encounter (Signed)
Patient is requesting a call back from [place] regarding this bill. Patient did not want to speak with billing at this time.

## 2018-06-27 NOTE — Telephone Encounter (Signed)
Pt states he spoke to [facility] this am and both the lab charges are still there.  Pt states you were going to credit on of them for his testosterone lab that was done at the wrong time of day. Ok to leave detailed message if you get his VM.

## 2018-06-27 NOTE — Telephone Encounter (Signed)
Leaving this for Lea- not much I can do here.

## 2018-06-28 NOTE — Telephone Encounter (Signed)
I called and lvm for the patient. I explained that I sent an email to our [facility] rep on 09/16 and again on 11/21 to find out the status of the claim. I let him know that I will call him back as soon as I received an update.

## 2018-07-07 ENCOUNTER — Ambulatory Visit: Payer: BLUE CROSS/BLUE SHIELD | Admitting: Family Medicine

## 2018-07-07 NOTE — Telephone Encounter (Signed)
Patient does not want to pay the wrong bill and it create complications. Patient understands that you are still waiting to hear back for an update however, requests a call just on which bill he needs to pay.

## 2018-07-08 NOTE — Telephone Encounter (Signed)
I called and left a voicemail communicating the message below.

## 2018-07-08 NOTE — Telephone Encounter (Signed)
I did not have an opportunity to call this patient yesterday. I have asked Mel Almond to call [facility] in my absence to find out the status. I have emailed [facility] twice asking for a status. I would advise the patient to pay for the second testerone test as we are working on getting the first test's charges.

## 2018-07-11 ENCOUNTER — Encounter: Payer: Self-pay | Admitting: Physician Assistant

## 2018-07-11 ENCOUNTER — Ambulatory Visit: Payer: BLUE CROSS/BLUE SHIELD | Admitting: Physician Assistant

## 2018-07-11 VITALS — BP 110/70 | HR 72 | Temp 98.1°F | Ht 69.0 in | Wt 176.4 lb

## 2018-07-11 DIAGNOSIS — H579 Unspecified disorder of eye and adnexa: Secondary | ICD-10-CM

## 2018-07-11 DIAGNOSIS — M542 Cervicalgia: Secondary | ICD-10-CM | POA: Diagnosis not present

## 2018-07-11 MED ORDER — CYCLOBENZAPRINE HCL 10 MG PO TABS: 10.0000 mg | ORAL_TABLET | Freq: Every evening | ORAL | 0 refills | Status: DC | PRN

## 2018-07-11 MED ORDER — ERYTHROMYCIN 5 MG/GM OP OINT: 1.0000 "application " | TOPICAL_OINTMENT | Freq: Every day | OPHTHALMIC | 0 refills | Status: DC

## 2018-07-11 MED ORDER — METHYLPREDNISOLONE ACETATE 80 MG/ML IJ SUSP
80.0000 mg | Freq: Once | INTRAMUSCULAR | Status: AC
Start: 2018-07-11 — End: 2018-07-11
  Administered 2018-07-11: 80 mg via INTRAMUSCULAR

## 2018-07-11 NOTE — Progress Notes (Signed)
Calvin Adams is a 56 y.o. male here for a new problem.  I acted as a Education administrator for [employer], PA-C Anselmo Pickler, LPN  History of Present Illness:   Chief Complaint  Patient presents with  . Neck Pain    Neck Pain   This is a new problem. Episode onset: Started 2 weeks ago, after moving heavy furniture. The problem occurs constantly. The problem has been gradually worsening. The pain is associated with lifting a heavy object. The pain is present in the left side (radiates down left arm). The quality of the pain is described as cramping and aching. The pain is at a severity of 8/10. The pain is moderate. The symptoms are aggravated by position (Driving a car ). The pain is worse during the day. Associated symptoms include headaches (dull at back of neck). Pertinent negatives include no numbness or tingling. He has tried heat (aleve, Ibuprofen) for the symptoms. The treatment provided mild relief.  Denies fever.  Eye pain Patient reports that about 1 to 2 weeks ago he was taking down something like a like picture, and felt like some debris got into his eye.  He immediately flushed his eye, and states that he felt like he got everything out of it.  Earlier today however he felt like there was something in his eye and when he looked in it he was able to remove a small black fleck of something.  He denies double vision, severe pain, changes in vision, eye crusting, purulent discharge.   Past Medical History:  Diagnosis Date  . Allergy   . Arthritis   . Hyperlipidemia   . Umbilical hernia      Social History   Socioeconomic History  . Marital status: Single    Spouse name: Not on file  . Number of children: Not on file  . Years of education: Not on file  . Highest education level: Not on file  Occupational History  . Not on file  Social Needs  . Financial resource strain: Not on file  . Food insecurity:    Worry: Not on file    Inability: Not on file  . Transportation  needs:    Medical: Not on file    Non-medical: Not on file  Tobacco Use  . Smoking status: Never Smoker  . Smokeless tobacco: Never Used  Substance and Sexual Activity  . Alcohol use: Yes    Comment: rare usage  . Drug use: No  . Sexual activity: Never  Lifestyle  . Physical activity:    Days per week: Not on file    Minutes per session: Not on file  . Stress: Not on file  Relationships  . Social connections:    Talks on phone: Not on file    Gets together: Not on file    Attends religious service: Not on file    Active member of club or organization: Not on file    Attends meetings of clubs or organizations: Not on file    Relationship status: Not on file  . Intimate partner violence:    Fear of current or ex partner: Not on file    Emotionally abused: Not on file    Physically abused: Not on file    Forced sexual activity: Not on file  Other Topics Concern  . Not on file  Social History Narrative   Regular exercise      From [place], [place]. Moved [place] in 2002.    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    Lives alone. Single. 2 brothers and a sister    Hobbies: working in yard, gardening, working out    Past Surgical History:  Procedure Laterality Date  . spermaticeal removal     in [place], [place]  . UMBILICAL HERNIA REPAIR  2013    Family History  Problem Relation Age of Onset  . Hypertension Mother   . Brain cancer Father        brain tumor, died before patient was born  . Breast cancer Maternal Aunt        several aunts  . Diabetes Sister   . Colon cancer Neg Hx   . Esophageal cancer Neg Hx   . Rectal cancer Neg Hx   . Stomach cancer Neg Hx   . Other Neg Hx        hypogonadism    No Known Allergies  Current Medications:   Current Outpatient Medications:  .  fenofibrate micronized (ANTARA) 130 MG capsule, TAKE ONE CAPSULE BY MOUTH DAILY BEFORE BEFORE BREAKFAST, Disp: 30 capsule, Rfl: 10 .  meloxicam (MOBIC) 15 MG tablet, Take 1 tablet (15 mg total)  by mouth daily as needed for pain., Disp: 30 tablet, Rfl: 1 .  Multiple Vitamin (MULTIVITAMIN) capsule, Take 1 capsule by mouth daily.  , Disp: , Rfl:  .  cyclobenzaprine (FLEXERIL) 10 MG tablet, Take 1 tablet (10 mg total) by mouth at bedtime as needed for muscle spasms., Disp: 30 tablet, Rfl: 0 .  erythromycin ophthalmic ointment, Place 1 application into the right eye at bedtime., Disp: 3.5 g, Rfl: 0   Review of Systems:   Review of Systems  Musculoskeletal: Positive for neck pain.  Neurological: Positive for headaches (dull at back of neck). Negative for tingling and numbness.    Vitals:   Vitals:   07/11/18 1556  BP: 110/70  Pulse: 72  Temp: 98.1 F (36.7 C)  TempSrc: Oral  SpO2: 98%  Weight: 176 lb 6.1 oz (80 kg)  Height: 5\' 9"  (1.753 m)     Body mass index is 26.05 kg/m.  Physical Exam:   Physical Exam  Constitutional: He appears well-developed. He is cooperative.  Non-toxic appearance. He does not have a sickly appearance. He does not appear ill. No distress.  Eyes: Left conjunctiva is injected.  Slit lamp exam:      The right eye shows no fluorescein uptake.  Cardiovascular: Normal rate, regular rhythm, S1 normal, S2 normal, normal heart sounds and normal pulses.  No LE edema  Pulmonary/Chest: Effort normal and breath sounds normal.  Musculoskeletal:  No decreased ROM 2/2 pain with flexion/extension, lateral side bends, or rotation. Reproducible tenderness with deep palpation to left paraspinal cervicle muscles. No bony tenderness. No evidence of erythema, rash or ecchymosis. Negative STLR bilaterally.   Neurological: He is alert. GCS eye subscore is 4. GCS verbal subscore is 5. GCS motor subscore is 6.  Skin: Skin is warm, dry and intact.  Psychiatric: He has a normal mood and affect. His speech is normal and behavior is normal.  Nursing note and vitals reviewed.    Assessment and Plan:   Calvin Adams was seen today for neck pain.  Diagnoses and all orders for  this visit:  Neck pain No red flags on exam.  Suspect muscle spasm.  Depo-Medrol injection today.  I also gave him a prescription of Flexeril to help with spasm as needed.  Follow-up if symptoms worsen or persist despite treatment. -  methylPREDNISolone acetate (DEPO-MEDROL) injection 80 mg  Sensation of foreign body in eye No fluorescein uptake in R eye. Did provide rx for erythromycin ointment to cover for any infection. If symptoms persist -- go to eye doctor.    Other orders -     cyclobenzaprine (FLEXERIL) 10 MG tablet; Take 1 tablet (10 mg total) by mouth at bedtime as needed for muscle spasms. -     erythromycin ophthalmic ointment; Place 1 application into the right eye at bedtime.  . Reviewed expectations re: course of current medical issues. . Discussed self-management of symptoms. . Outlined signs and symptoms indicating need for more acute intervention. . Patient verbalized understanding and all questions were answered. . See orders for this visit as documented in the electronic medical record. . Patient received an After-Visit Summary.  CMA or LPN served as scribe during this visit. History, Physical, and Plan performed by medical provider. The above documentation has been reviewed and is accurate and complete.  Inda Coke, PA-C

## 2018-07-11 NOTE — Patient Instructions (Signed)
It was great to see you!  May use flexeril as needed for muscle spasms -- may make you drowsy so take while at home.  If neck spasm persists, please make an appointment with Dr. Paulla Fore here in our office for further evaluation.  Use eye ointment daily x 5 days. If symptoms persist, please see your eye doctor.  Take care,  Inda Coke PA-C

## 2018-07-12 ENCOUNTER — Encounter: Payer: Self-pay | Admitting: Physician Assistant

## 2018-07-12 ENCOUNTER — Other Ambulatory Visit (INDEPENDENT_AMBULATORY_CARE_PROVIDER_SITE_OTHER): Payer: BLUE CROSS/BLUE SHIELD

## 2018-07-12 ENCOUNTER — Telehealth: Payer: Self-pay | Admitting: *Deleted

## 2018-07-12 DIAGNOSIS — M159 Polyosteoarthritis, unspecified: Secondary | ICD-10-CM

## 2018-07-12 DIAGNOSIS — Z79899 Other long term (current) drug therapy: Secondary | ICD-10-CM | POA: Diagnosis not present

## 2018-07-12 DIAGNOSIS — E785 Hyperlipidemia, unspecified: Secondary | ICD-10-CM

## 2018-07-12 LAB — BASIC METABOLIC PANEL
BUN: 16 mg/dL (ref 6–23)
CO2: 27 mEq/L (ref 19–32)
Calcium: 9.2 mg/dL (ref 8.4–10.5)
Chloride: 106 mEq/L (ref 96–112)
Creatinine, Ser: 1.04 mg/dL (ref 0.40–1.50)
GFR: 78.61 mL/min (ref >60.00)
Glucose, Bld: 89 mg/dL (ref 70–99)
Potassium: 4.2 mEq/L (ref 3.5–5.1)
Sodium: 139 mEq/L (ref 135–145)

## 2018-07-12 LAB — TRIGLYCERIDES: Triglycerides: 206 mg/dL — ABNORMAL HIGH (ref 0.0–149.0)

## 2018-07-12 NOTE — Telephone Encounter (Signed)
Copied from [place] [id number]. Topic: General - Other >> Jul 11, 2018  4:55 PM Virl Axe D wrote: Reason for CRM: Pt stated that the pharmacy did not receive one of the two Rx's sent over today 07/11/18. Please resend erythromycin ophthalmic ointment .  [facility] [address], [place] [phone] (Phone) [phone] (Fax)

## 2018-07-12 NOTE — Addendum Note (Signed)
Addended by: Kayren Eaves T on: 07/12/2018 09:53 AM   Modules accepted: Orders

## 2018-07-15 ENCOUNTER — Ambulatory Visit: Payer: BLUE CROSS/BLUE SHIELD | Admitting: Family Medicine

## 2018-07-16 ENCOUNTER — Encounter: Payer: Self-pay | Admitting: Family Medicine

## 2018-07-16 DIAGNOSIS — M19012 Primary osteoarthritis, left shoulder: Secondary | ICD-10-CM

## 2018-07-18 MED ORDER — DICLOFENAC SODIUM 75 MG PO TBEC: 75.0000 mg | DELAYED_RELEASE_TABLET | Freq: Two times a day (BID) | ORAL | 2 refills | Status: DC | PRN

## 2018-07-18 MED ORDER — FENOFIBRATE 160 MG PO TABS: 160.0000 mg | ORAL_TABLET | Freq: Every day | ORAL | 1 refills | Status: DC

## 2018-07-20 NOTE — Telephone Encounter (Signed)
Spoke with pt, he confirmed that he had picked up the eye ointment, no further action required at this time.

## 2018-09-08 ENCOUNTER — Other Ambulatory Visit: Payer: BLUE CROSS/BLUE SHIELD

## 2019-03-10 ENCOUNTER — Encounter: Payer: BLUE CROSS/BLUE SHIELD | Admitting: Family Medicine

## 2019-05-12 ENCOUNTER — Other Ambulatory Visit: Payer: Self-pay

## 2019-05-12 DIAGNOSIS — Z20822 Contact with and (suspected) exposure to covid-19: Secondary | ICD-10-CM

## 2019-05-13 LAB — NOVEL CORONAVIRUS, NAA: SARS-CoV-2, NAA: NOT DETECTED

## 2019-06-27 ENCOUNTER — Other Ambulatory Visit: Payer: Self-pay

## 2019-06-27 DIAGNOSIS — Z20822 Contact with and (suspected) exposure to covid-19: Secondary | ICD-10-CM

## 2019-06-29 LAB — NOVEL CORONAVIRUS, NAA: SARS-CoV-2, NAA: NOT DETECTED

## 2019-07-31 ENCOUNTER — Other Ambulatory Visit: Payer: BLUE CROSS/BLUE SHIELD

## 2019-08-01 ENCOUNTER — Ambulatory Visit: Payer: BLUE CROSS/BLUE SHIELD | Attending: Internal Medicine

## 2019-08-01 DIAGNOSIS — Z20822 Contact with and (suspected) exposure to covid-19: Secondary | ICD-10-CM

## 2019-08-02 LAB — NOVEL CORONAVIRUS, NAA: SARS-CoV-2, NAA: NOT DETECTED

## 2019-08-22 ENCOUNTER — Encounter: Payer: Self-pay | Admitting: Family Medicine

## 2019-08-22 ENCOUNTER — Ambulatory Visit (INDEPENDENT_AMBULATORY_CARE_PROVIDER_SITE_OTHER): Payer: 59 | Admitting: Family Medicine

## 2019-08-22 VITALS — Ht 69.0 in | Wt 172.0 lb

## 2019-08-22 DIAGNOSIS — R05 Cough: Secondary | ICD-10-CM | POA: Diagnosis not present

## 2019-08-22 DIAGNOSIS — J4520 Mild intermittent asthma, uncomplicated: Secondary | ICD-10-CM | POA: Diagnosis not present

## 2019-08-22 DIAGNOSIS — R053 Chronic cough: Secondary | ICD-10-CM

## 2019-08-22 MED ORDER — DOXYCYCLINE HYCLATE 100 MG PO TABS: 100.0000 mg | ORAL_TABLET | Freq: Two times a day (BID) | ORAL | 0 refills | Status: DC

## 2019-08-22 MED ORDER — BUDESONIDE-FORMOTEROL FUMARATE 160-4.5 MCG/ACT IN AERO: 2.0000 | INHALATION_SPRAY | Freq: Two times a day (BID) | RESPIRATORY_TRACT | 3 refills | Status: DC | PRN

## 2019-08-22 NOTE — Progress Notes (Signed)
Phone [phone] Virtual visit via Video note   Subjective:  Chief complaint: Chief Complaint  Patient presents with  . Cough    x 1 yr     This visit type was conducted due to national recommendations for restrictions regarding the COVID-19 Pandemic (e.g. social distancing).  This format is felt to be most appropriate for this patient at this time balancing risks to patient and risks to population by having him in for in person visit.  No physical exam was performed (except for noted visual exam or audio findings with Telehealth visits).    Our team/I connected with Calvin Adams at  1:20 PM EST by a video enabled telemedicine application (doxy.me or caregility through epic) and verified that I am speaking with the correct person using two identifiers.  Location patient: [facility] Location provider: [facility], office Persons participating in the virtual visit:  patient  Our team/I discussed the limitations of evaluation and management by telemedicine and the availability of in person appointments. In light of current covid-19 pandemic, patient also understands that we are trying to protect them by minimizing in office contact if at all possible.  The patient expressed consent for telemedicine visit and agreed to proceed. Patient understands insurance will be billed.   Past Medical History-  Patient Active Problem List   Diagnosis Date Noted  . Testicular hypofunction 07/29/2009    Priority: Medium  . Osteoarthritis of multiple joints 04/16/2008    Priority: Medium  . Hypertriglyceridemia 05/30/2007    Priority: Medium  . Asthma 05/30/2007    Priority: Medium  . Erythema nodosum 05/30/2007    Priority: Medium  . Post-inflammatory hyperpigmentation 04/23/2016    Priority: Low  . Osteoarthritis 06/04/2015    Priority: Low  . 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 Hx of umbilical hernia repair 123456    Priority: Low  . Tendinopathy of right  rotator cuff 12/16/2009    Priority: Low  . BACK PAIN 11/29/2009    Priority: Low  . ERECTILE DYSFUNCTION, ORGANIC 04/24/2009    Priority: Low  . HERPES, GENITAL NOS 05/30/2007    Priority: Low  . ALLERGIC RHINITIS 05/30/2007    Priority: Low  . Umbilical pain 123456    Medications- reviewed and updated Current Outpatient Medications  Medication Sig Dispense Refill  . albuterol (VENTOLIN HFA) 108 (90 Base) MCG/ACT inhaler     . fenofibrate 160 MG tablet Take 1 tablet (160 mg total) by mouth daily. 90 tablet 1  . budesonide-formoterol (SYMBICORT) 160-4.5 MCG/ACT inhaler Inhale 2 puffs into the lungs 2 (two) times daily as needed (asthma flare). 1 Inhaler 3  . doxycycline (VIBRA-TABS) 100 MG tablet Take 1 tablet (100 mg total) by mouth 2 (two) times daily. 20 tablet 0   No current facility-administered medications for this visit.     Objective:  Ht 5\' 9"  (1.753 m)   Wt 172 lb (78 kg)   BMI 25.40 kg/m  self reported vitals Gen: NAD, resting comfortably Lungs: nonlabored, normal respiratory rate  Skin: appears dry, no obvious rash     Assessment and Plan   # Chronic Cough S:on going for at least a year-patient thinks longer. Seen by Dr. Quincy Simmonds with [facility].  I was previously patient's PCP but had to switch to [facility] due to insurance issues.  Now for insurance issues again he has had to switch back to me.  He had been working with Dr. Quincy Simmonds on this at least since  last summer. Had a chest x-ray in summer which was normal per his report-I reviewed care everywhere and report from Dec 08, 2018 stated "no acute cardiopulmonary process". Nagging dry cough that seems to be worse at night. Dr. Quincy Simmonds thought it could be nasal drainage related.  Patient was never a smoker.  Patient did some research on his own and became concerned about  Psittacosis.  Patient has a few parrotts but keeps them very clean. He uses a mask if he cleans their changes.  Patient discussed case with  Dr. Quincy Simmonds and due to this possibility - treated with doxycycline x7 days and he did note significant improvement. Had been treated with azithromycin prior with no improvement.  He reports 60-80% improvement. Was feeling winded with activity prior and that has improved. Had faint rash on his cheeks that got better on doxycycline as well.  Patient reports the next planned step last year was pulmonary function test but those were going to cost him $1200 so he opted to wait  Patient does have a history of asthma and reports inhaler helps to 1 time he used it.  This seemed to help with coughing spells but reports no coughing spells since doxycycline.  Has had 4 negative covid tests reportedly.  Most recent test I see is 03/30/2019 which was negative/not detected through [facility]  ROS-no chest pain reported.  Patient mentioned mentioned lightheadedness spells back prior to august- has not recurred-he will let me know if recurs  A/P: 59 year old male with chronic cough for over a year.  Chest x-ray about 7 months ago largely reassuring.  Never smoker. -  Does have history of asthma but reported no issues in years.  We discussed potential for cough variant asthma.  He is concerned about trying oral steroids so we opted to try Symbicort for minimum of 1 week to see if this helps symptoms at all -Given chronicity of cough and improvement on doxycycline we opted to treat for 10 days to see if this would further eradicate the cough.  I do not strongly suspect psittacosis but with prior improvement in rash on face as well as breathing issues I think the trial is reasonable -Advised trial of Pepcid for potential GERD -He has already tried multiple allergy treatments without relief and do not strongly suspect allergic rhinitis as the cause -Overall we opted to treat for multiple angles since chronic cough can be difficult to manage -If no improvement within a few weeks-at least 2-I recommend pulmonology referral.  He  will update [place]  Recommended follow up: As needed for acute concerns-we need to set up a regular follow-up when he is feeling better  Lab/Order associations:   ICD-10-CM   1. Chronic cough  R05   2. Mild intermittent asthma without complication  [id number]     Meds ordered this encounter  Medications  . budesonide-formoterol (SYMBICORT) 160-4.5 MCG/ACT inhaler    Sig: Inhale 2 puffs into the lungs 2 (two) times daily as needed (asthma flare).    Dispense:  1 Inhaler    Refill:  3  . doxycycline (VIBRA-TABS) 100 MG tablet    Sig: Take 1 tablet (100 mg total) by mouth 2 (two) times daily.    Dispense:  20 tablet    Refill:  0    Time Spent: 36 minutes of total time (1:28 PM- 1:55 PM, 11: 13 PM- 11:22 PM) was spent on the date of the encounter performing the following actions: chart review prior to seeing the  patient, obtaining history, performing a medically necessary exam, counseling on the treatment plan, placing orders, and documenting in our EHR.   Return precautions advised.  Garret Reddish, MD

## 2019-08-22 NOTE — Patient Instructions (Addendum)
There are no preventive care reminders to display for this patient.  Depression screen [facility] 2/9 08/22/2019 03/08/2018 03/08/2017  Decreased Interest 0 0 0  Down, Depressed, Hopeless 0 0 0  PHQ - 2 Score 0 0 0  Altered sleeping 0 - -  Tired, decreased energy 0 - -  Change in appetite 0 - -  Feeling bad or failure about yourself  0 - -  Trouble concentrating 0 - -  Moving slowly or fidgety/restless 0 - -  Suicidal thoughts 0 - -  PHQ-9 Score 0 - -  Difficult doing work/chores Not difficult at all - -    Recommended follow up: No follow-ups on file.   We are committed to keeping you informed about the COVID-19 vaccine.  As the vaccine continues to become available for each phase, we will ensure that patients who meet the criteria receive the information they need to access vaccination opportunities. Continue to check your MyChart account and [email or web address] for updates. Please review the Phase 1b information below.  Following [place]'s guidelines for the distribution of COVID-19 vaccines we are pleased to share our plans to begin offering vaccines to those 65 and older (Phase 1b). Here are details of those plans:  [place] On Tuesday, Jan. 19, the [facility]) and [place] begin large-scale COVID-19 vaccinations at the [place]. The vaccinations are appointment only and for those 61 and older.  Walk-ins will not be accepted.  All appointments are currently filled. Please join our waiting list for the next available appointments. We will contact you when appointments become available. Please do not sign up more than once.  Join Our Waiting List   Other Vaccination Opportunities in [place] We are also working in partnership with county health agencies in our service counties to ensure continuing vaccination availability in the weeks and months  ahead. Learn more about each county's vaccination efforts in the website links below:   [place]'s phase 1b vaccination guidelines, prioritizing those 65 and over as the next eligible group to receive the COVID-19 vaccine, are detailed at MobCommunity.ch.   Vaccine Safety and Effectiveness Clinical trials for the Pfizer COVID-19 vaccine involved 42,000 people and showed that the vaccine is more than 95% effective in preventing COVID-19 with no serious safety concerns. Similar results have been reported for the Moderna COVID-19 vaccine. Side effects reported in the [place] clinical trials include a sore arm at the injection site, fatigue, headache, chills and fever. While side effects from the [place] COVID-19 vaccine are higher than for a typical flu vaccine, they are lower in many ways than side effects from the leading vaccine to prevent shingles. Side effects are signs that a vaccine is working and are related to your immune system being stimulated to produce antibodies against infection. Side effects from vaccination are far less significant than health impacts from COVID-19.  Staying Informed Pharmacists, infectious disease doctors, critical care nurses and other experts at [facility] continue to speak publicly through media interviews and direct communication with our patients and communities about the safety, effectiveness and importance of vaccines to eliminate COVID-19. In addition, reliable information on vaccine safety, effectiveness, side effects and more is available on the following websites:  [place] Department of Health and Human Services COVID-19 Vaccine Information Website.  U.S. Centers for Disease Control and Prevention XX123456 Vaccine Public  Information Website.  Staying Safe We agree with the CDC on what we can do to help our communities get back to normal: Getting "back to normal" is going to take all  of our tools. If we use all the tools we have, we stand the best chance of getting our families, communities, schools and workplaces "back to normal" sooner:  Get vaccinated as soon as vaccines become available within the phase of the state's vaccination rollout plan for which you meet the eligibility criteria.  Wear a mask.  Stay 6 feet from others and avoid crowds.  Wash hands often.  For our most current information, please visit [email or web address].

## 2019-08-30 ENCOUNTER — Telehealth: Payer: Self-pay

## 2019-08-30 ENCOUNTER — Other Ambulatory Visit: Payer: Self-pay

## 2019-08-30 MED ORDER — FENOFIBRATE 160 MG PO TABS: 160.0000 mg | ORAL_TABLET | Freq: Every day | ORAL | 1 refills | Status: DC

## 2019-08-30 NOTE — Telephone Encounter (Signed)
MEDICATION:fenofibrate 160 MG tablet    PHARMACY: [facility] [address] [place], [place] Phone:  [phone]  Fax:  [phone]       Comments:   **Let patient know to contact pharmacy at the end of the day to make sure medication is ready. **  ** Please notify patient to allow 48-72 hours to process**  **Encourage patient to contact the pharmacy for refills or they can request refills through [facility]

## 2019-10-10 ENCOUNTER — Other Ambulatory Visit (HOSPITAL_COMMUNITY): Payer: BLUE CROSS/BLUE SHIELD

## 2019-10-16 ENCOUNTER — Other Ambulatory Visit: Payer: Self-pay

## 2019-10-16 ENCOUNTER — Telehealth: Payer: Self-pay | Admitting: Family Medicine

## 2019-10-16 DIAGNOSIS — R053 Chronic cough: Secondary | ICD-10-CM

## 2019-10-16 DIAGNOSIS — R05 Cough: Secondary | ICD-10-CM

## 2019-10-16 NOTE — Telephone Encounter (Signed)
Patient calls in saying that he has spoken to Dr.Hunter in the past about a cough that just seems like it will not go away, and if it wasn't getting better Dr.Hunter advised to the patient that he would put in a referral for pulmonology, and Ethelbert wanted to know if this would be possible. Basil would like a returned phone call if any questions.

## 2019-10-16 NOTE — Telephone Encounter (Signed)
FYI  Per last note referral has been started.

## 2019-10-16 NOTE — Telephone Encounter (Signed)
Perfect- please let patient know its in process

## 2019-11-14 ENCOUNTER — Other Ambulatory Visit: Payer: Self-pay

## 2019-11-14 ENCOUNTER — Ambulatory Visit (INDEPENDENT_AMBULATORY_CARE_PROVIDER_SITE_OTHER): Payer: 59 | Admitting: Family Medicine

## 2019-11-14 ENCOUNTER — Encounter: Payer: Self-pay | Admitting: Family Medicine

## 2019-11-14 VITALS — BP 100/58 | HR 96 | Temp 98.2°F | Wt 171.4 lb

## 2019-11-14 DIAGNOSIS — M546 Pain in thoracic spine: Secondary | ICD-10-CM | POA: Diagnosis not present

## 2019-11-14 MED ORDER — METHYLPREDNISOLONE ACETATE 40 MG/ML IJ SUSP
40.0000 mg | Freq: Once | INTRAMUSCULAR | Status: AC
  Administered 2019-11-14: 40 mg via INTRAMUSCULAR

## 2019-11-14 MED ORDER — CYCLOBENZAPRINE HCL 10 MG PO TABS: 10.0000 mg | ORAL_TABLET | Freq: Three times a day (TID) | ORAL | 0 refills | Status: DC | PRN

## 2019-11-14 MED ORDER — METHYLPREDNISOLONE ACETATE 80 MG/ML IJ SUSP
80.0000 mg | Freq: Once | INTRAMUSCULAR | Status: AC
  Administered 2019-11-14: 80 mg via INTRAMUSCULAR

## 2019-11-14 NOTE — Progress Notes (Signed)
   Subjective:    Patient ID: Calvin Adams, male    DOB: January 03, 1961, 59 y.o.   MRN: [id number]  HPI Here for 4 days of spasms and pain in the left upper back. He has had these several times in the past. He thinks it was caused by carrying around heavy ladders at his home last weekend. He has tried heat and Aleve with mixed results.    Review of Systems  Constitutional: Negative.   Respiratory: Negative.   Cardiovascular: Negative.   Musculoskeletal: Positive for back pain.       Objective:   Physical Exam Constitutional:      Appearance: Normal appearance.  Cardiovascular:     Rate and Rhythm: Normal rate and regular rhythm.     Pulses: Normal pulses.     Heart sounds: Normal heart sounds.  Pulmonary:     Effort: Pulmonary effort is normal.     Breath sounds: Normal breath sounds.  Musculoskeletal:     Comments: He is tender in the left upper back between the spine and the left scapula. There is spasm here and a palpable "knot"   Neurological:     Mental Status: He is alert.           Assessment & Plan:  Thoracic spasms. He is given a DepoMedrol shot and Flexeril. Recheck prn.  Alysia Penna, MD

## 2019-11-14 NOTE — Addendum Note (Signed)
Addended by: Rebecca Eaton on: 11/14/2019 04:29 PM   Modules accepted: Orders

## 2019-11-16 ENCOUNTER — Ambulatory Visit (INDEPENDENT_AMBULATORY_CARE_PROVIDER_SITE_OTHER): Payer: 59 | Admitting: Pulmonary Disease

## 2019-11-16 ENCOUNTER — Other Ambulatory Visit: Payer: Self-pay

## 2019-11-16 ENCOUNTER — Encounter: Payer: Self-pay | Admitting: Pulmonary Disease

## 2019-11-16 VITALS — BP 114/70 | HR 67 | Temp 97.3°F | Ht 69.0 in | Wt 169.8 lb

## 2019-11-16 DIAGNOSIS — Z87898 Personal history of other specified conditions: Secondary | ICD-10-CM | POA: Diagnosis not present

## 2019-11-16 DIAGNOSIS — Z7729 Contact with and (suspected ) exposure to other hazardous substances: Secondary | ICD-10-CM | POA: Diagnosis not present

## 2019-11-16 DIAGNOSIS — R059 Cough, unspecified: Secondary | ICD-10-CM

## 2019-11-16 DIAGNOSIS — R05 Cough: Secondary | ICD-10-CM | POA: Diagnosis not present

## 2019-11-16 NOTE — Patient Instructions (Addendum)
Thank you for visiting Dr. Valeta Harms at [facility]. Today we recommend the following: Orders Placed This Encounter  Procedures  . Pulmonary Function Test   Return in 6 weeks (on 12/28/2019), or if symptoms worsen or fail to improve. Please schedule with Wyn Quaker, NP to review PFTs once completed.     Please do your part to reduce the spread of COVID-19.

## 2019-11-16 NOTE — Progress Notes (Signed)
Synopsis: Referred in April 2021 for cough.  By Marin Olp, MD  Subjective:   PATIENT ID: Calvin Adams GENDER: male DOB: Feb 24, 1961, MRN: [id number]  Chief Complaint  Patient presents with  . Pulmonary Consult    Referred for persistent cough. Patient reports that he has a constant cough that has lingered for 1 year. He states that its worse in the morning and in the evenings.     59 year old gentleman past medical history of allergies hyperlipidemia.  Patient presents as a referral for persistent cough.  Patient seen by primary care April 2021 for left-sided back pain.  Patient last seen in January by Dr. Yong Channel with issues regarding chronic cough.  Patient was seen by Dr. Quincy Simmonds at [facility] in the past.  Per Dr. Ansel Bong notes.  Thought to be related to chronic nasal drainage.  Patient does have a few parrots in his home.  He was treated with doxycycline and azithromycin in the past.  He was planning to have pulmonary function test due to cost this was held.  He has had 4 - Covid tests.  Plans for referral to pulmonary were made. Occupation: flipping houses Visual merchandiser.  Of note the patient has a 5 birds in his home.  He has routinely cared for birds for many years.  Prior occupational history with chemical exposure.  He works for a [employer] company.  At this time his cough is improved.  He had a upper respiratory tract type infection and was sick admitted to the hospital with appendicitis and February.  His cough now is only in the morning.  It is worse if he sleeps on his back.  He does have reflux and takes medications for this on occasion.   Past Medical History:  Diagnosis Date  . Allergy   . Arthritis   . Hyperlipidemia   . Umbilical hernia      Family History  Problem Relation Age of Onset  . Hypertension Mother   . Brain cancer Father        brain tumor, died before patient was born  . Breast cancer Maternal Aunt         several aunts  . Diabetes Sister   . Colon cancer Neg Hx   . Esophageal cancer Neg Hx   . Rectal cancer Neg Hx   . Stomach cancer Neg Hx   . Other Neg Hx        hypogonadism     Past Surgical History:  Procedure Laterality Date  . spermaticeal removal     in [place], [place]  . UMBILICAL HERNIA REPAIR  2013    Social History   Socioeconomic History  . Marital status: Single    Spouse name: Not on file  . Number of children: Not on file  . Years of education: Not on file  . Highest education level: Not on file  Occupational History  . Not on file  Tobacco Use  . Smoking status: Never Smoker  . Smokeless tobacco: Never Used  Substance and Sexual Activity  . Alcohol use: Yes    Comment: rare usage  . Drug use: No  . Sexual activity: Never  Other Topics Concern  . Not on file  Social History Narrative   Regular exercise      From [place], [place]. Moved [place] in 2002.    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    Lives alone. Single. 2 brothers and a sister  Hobbies: working in yard, gardening, working out   Investment banker, operational of Radio broadcast assistant Strain:   . Difficulty of Paying Living Expenses:   Food Insecurity:   . Worried About Charity fundraiser in the Last Year:   . Arboriculturist in the Last Year:   Transportation Needs:   . Film/video editor (Medical):   Marland Kitchen Lack of Transportation (Non-Medical):   Physical Activity:   . Days of Exercise per Week:   . Minutes of Exercise per Session:   Stress:   . Feeling of Stress :   Social Connections:   . Frequency of Communication with Friends and Family:   . Frequency of Social Gatherings with Friends and Family:   . Attends Religious Services:   . Active Member of Clubs or Organizations:   . Attends Archivist Meetings:   Marland Kitchen Marital Status:   Intimate Partner Violence:   . Fear of Current or Ex-Partner:   . Emotionally Abused:   Marland Kitchen Physically Abused:   . Sexually Abused:      No Known  Allergies   Outpatient Medications Prior to Visit  Medication Sig Dispense Refill  . cyclobenzaprine (FLEXERIL) 10 MG tablet Take 1 tablet (10 mg total) by mouth 3 (three) times daily as needed for muscle spasms. 60 tablet 0  . fenofibrate 160 MG tablet Take 1 tablet (160 mg total) by mouth daily. 90 tablet 1  . albuterol (VENTOLIN HFA) 108 (90 Base) MCG/ACT inhaler Inhale 2 puffs into the lungs every 6 (six) hours as needed.     . budesonide-formoterol (SYMBICORT) 160-4.5 MCG/ACT inhaler Inhale 2 puffs into the lungs 2 (two) times daily as needed (asthma flare). (Patient not taking: Reported on 11/16/2019) 1 Inhaler 3  . doxycycline (VIBRA-TABS) 100 MG tablet Take 1 tablet (100 mg total) by mouth 2 (two) times daily. 20 tablet 0   No facility-administered medications prior to visit.    Review of Systems  Constitutional: Negative for chills, fever, malaise/fatigue and weight loss.  HENT: Negative for hearing loss, sore throat and tinnitus.   Eyes: Negative for blurred vision and double vision.  Respiratory: Positive for cough. Negative for hemoptysis, sputum production, shortness of breath, wheezing and stridor.   Cardiovascular: Negative for chest pain, palpitations, orthopnea, leg swelling and PND.  Gastrointestinal: Negative for abdominal pain, constipation, diarrhea, heartburn, nausea and vomiting.  Genitourinary: Negative for dysuria, hematuria and urgency.  Musculoskeletal: Negative for joint pain and myalgias.  Skin: Negative for itching and rash.  Neurological: Negative for dizziness, tingling, weakness and headaches.  Endo/Heme/Allergies: Negative for environmental allergies. Does not bruise/bleed easily.  Psychiatric/Behavioral: Negative for depression. The patient is not nervous/anxious and does not have insomnia.   All other systems reviewed and are negative.    Objective:  Physical Exam Vitals reviewed.  Constitutional:      General: He is not in acute distress.     Appearance: He is well-developed.  HENT:     Head: Normocephalic and atraumatic.  Eyes:     General: No scleral icterus.    Conjunctiva/sclera: Conjunctivae normal.     Pupils: Pupils are equal, round, and reactive to light.  Neck:     Vascular: No JVD.     Trachea: No tracheal deviation.  Cardiovascular:     Rate and Rhythm: Normal rate and regular rhythm.     Heart sounds: Normal heart sounds. No murmur.  Pulmonary:     Effort: Pulmonary effort is normal.  No tachypnea, accessory muscle usage or respiratory distress.     Breath sounds: Normal breath sounds. No stridor. No wheezing, rhonchi or rales.  Abdominal:     General: Bowel sounds are normal. There is no distension.     Palpations: Abdomen is soft.     Tenderness: There is no abdominal tenderness.  Musculoskeletal:        General: No tenderness.     Cervical back: Neck supple.  Lymphadenopathy:     Cervical: No cervical adenopathy.  Skin:    General: Skin is warm and dry.     Capillary Refill: Capillary refill takes less than 2 seconds.     Findings: No rash.  Neurological:     Mental Status: He is alert and oriented to person, place, and time.  Psychiatric:        Behavior: Behavior normal.      Vitals:   11/16/19 1458  BP: 114/70  Pulse: 67  Temp: (!) 97.3 F (36.3 C)  TempSrc: Temporal  SpO2: 97%  Weight: 169 lb 12.8 oz (77 kg)  Height: 5\' 9"  (1.753 m)   97% on RA BMI Readings from Last 3 Encounters:  11/16/19 25.08 kg/m  11/14/19 25.31 kg/m  08/22/19 25.40 kg/m   Wt Readings from Last 3 Encounters:  11/16/19 169 lb 12.8 oz (77 kg)  11/14/19 171 lb 6.4 oz (77.7 kg)  08/22/19 172 lb (78 kg)     CBC    Component Value Date/Time   WBC 4.9 03/08/2018 1444   RBC 4.72 03/08/2018 1444   HGB 14.8 03/08/2018 1444   HCT 43.3 03/08/2018 1444   PLT 217.0 03/08/2018 1444   MCV 91.6 03/08/2018 1444   MCHC 34.3 03/08/2018 1444   RDW 12.7 03/08/2018 1444   LYMPHSABS 0.9 05/12/2017 1240   MONOABS  0.6 05/12/2017 1240   EOSABS 0.3 05/12/2017 1240   BASOSABS 0.0 05/12/2017 1240     Chest Imaging: None  Pulmonary Functions Testing Results: No flowsheet data found.  FeNO: None    Pathology: None   Echocardiogram: None   Heart Catheterization: nOne     Assessment & Plan:     ICD-10-CM   1. Cough  R05 Pulmonary Function Test  2. Long-term exposure involving bird droppings  Z77.29   3. History of chemical exposure  Z87.898     Discussion:  This is a 59 year old gentleman with longstanding history of cough for greater than a year.  He has had separate bouts of this that have been worse at times.  He does have exposure to 5 birds that live in his home that he cares for 3 large birds 2 smaller birds.  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  He now works as a Engineer, technical sales homes.  Plan: We will start with obtaining full pulmonary function test. If the PFTs are abnormal or show any evidence of restriction Would move forward with HRCT imaging if there is any evidence of abnormality He is hoping that there is not a significant cost involved with as he is can work with his insurance he does believe that he will meet his deductible as he recently had surgery for an appendicitis. Would also give him an ILD questionnaire if his PFTs are suggestive.  Patient return to office in 6 weeks to review PFTs once they are complete. Patient can be seen by Wyn Quaker, NP to review PFTs as  I know I will be out of the office 6 weeks from now.  Greater than 50% of this patient's 46-minute office was spent face-to-face discussing the above recommendations and treatment plan.   Current Outpatient Medications:  .  cyclobenzaprine (FLEXERIL) 10 MG tablet, Take 1 tablet (10 mg total) by mouth 3 (three) times daily as needed for muscle spasms., Disp: 60 tablet, Rfl: 0 .  fenofibrate 160 MG  tablet, Take 1 tablet (160 mg total) by mouth daily., Disp: 90 tablet, Rfl: 1 .  albuterol (VENTOLIN HFA) 108 (90 Base) MCG/ACT inhaler, Inhale 2 puffs into the lungs every 6 (six) hours as needed. , Disp: , Rfl:  .  budesonide-formoterol (SYMBICORT) 160-4.5 MCG/ACT inhaler, Inhale 2 puffs into the lungs 2 (two) times daily as needed (asthma flare). (Patient not taking: Reported on 11/16/2019), Disp: 1 Inhaler, Rfl: 3   Garner Nash, DO [facility] 11/16/2019 3:23 PM

## 2019-12-25 ENCOUNTER — Other Ambulatory Visit (HOSPITAL_COMMUNITY)
Admission: RE | Admit: 2019-12-25 | Discharge: 2019-12-25 | Disposition: A | Discharge status: Home / Self Care | Payer: 59 | Source: Ambulatory Visit | Attending: Pulmonary Disease | Admitting: Pulmonary Disease

## 2019-12-25 DIAGNOSIS — Z01812 Encounter for preprocedural laboratory examination: Secondary | ICD-10-CM | POA: Insufficient documentation

## 2019-12-25 DIAGNOSIS — Z20822 Contact with and (suspected) exposure to covid-19: Secondary | ICD-10-CM | POA: Insufficient documentation

## 2019-12-25 LAB — SARS CORONAVIRUS 2 (TAT 6-24 HRS): SARS Coronavirus 2: NEGATIVE

## 2019-12-26 ENCOUNTER — Telehealth: Payer: Self-pay | Admitting: Pulmonary Disease

## 2019-12-27 NOTE — Telephone Encounter (Signed)
I called [facility] they verfied that the  [id number] do not require authorization Call Ref# [id number]  I have called the patient and made him aware

## 2019-12-27 NOTE — Telephone Encounter (Signed)
im working on this

## 2019-12-27 NOTE — Progress Notes (Addendum)
Virtual Visit via Telephone Note  I connected with Calvin Adams on 12/28/19 at  2:00 PM EDT by telephone and verified that I am speaking with the correct person using two identifiers.  Location: Patient: Home Provider: [facility] - [address], [place], [place], [place] [address]   I discussed the limitations, risks, security and privacy concerns of performing an evaluation and management service by telephone and the availability of in person appointments. I also discussed with the patient that there may be a patient responsible charge related to this service. The patient expressed understanding and agreed to proceed.  Patient consented to consult via telephone: Yes People present and their role in pt care: Pt     History of Present Illness:  59 year old male never smoker referred to our office in April/2021 for evaluation of cough  Past medical history: Hyperlipidemia, erectile dysfunction, polyarticular osteoarthritis Smoker history: Never smoker Maintenance: Symbicort 160 Patient of Dr. Valeta Harms  Chief complaint: Televisit to review pulmonary function testing  59 year old male never smoker initially referred to our office in April/2021 for evaluation of cough.  Patient completing telephone visit after completing pulmonary function testing in our office today.  Pulmonary function test results listed below:  12/28/2019-pulmonary function test-FVC 4.75 (101% predicted), postbronchodilator ratio 81, postbronchodilator FEV1 3.94 (110% predicted), no bronchodilator response, DLCO 18.92 (69% predicted)  Patient reports the cough is better.  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  Patient also has a personal history of owning birds for many years.  He has exposures to 5 birds that live in his home and he cares for 3 large birds and 2 small birds.  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  Observations/Objective:  Social History   Tobacco Use  Smoking Status Never Smoker  Smokeless Tobacco Never Used   Immunization History  Administered Date(s) Administered  . Td 08/04/2003  . Tdap 03/14/2014  . 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  Given cough as well as exposure history and diffusion defect I do believe it is reasonable to obtain CT on axial imaging.  It is reassuring the patient's cough has improved.  He attributes this to antibiotic use.  Unfortunately though without documented imaging unsure if patient may have underlying interstitial lung disease that may be waxing and waning.  Patient is very concerned regarding radiation risk.  Patient's last chest imaging was a chest x-ray in May/2020 that I can see the radiology report showing no acute cardiopulmonary disease.  Unfortunately patient has not had a CT chest.  We will discuss case with Dr. Valeta Harms at patient's request who would like the "doctors opinion".  Plan: Proceed forward with high-resolution CT chest We will discuss case with Dr. Valeta Harms 47-monthfollow-up with Dr. IValeta Harms   Addendum: Discussed case with Dr. IValeta Harms  He agrees with proceeding forward with high-resolution CT chest.  If high-resolution CT chest shows abnormal imaging features will likely need to consider interstitial lung disease questionnaire as well as referral to ILD specialist.  Follow Up Instructions:  Return in about 3 months (around 03/29/2020), or if symptoms worsen or fail to improve, for Follow up with Dr. IValeta Harms  I discussed the assessment and treatment plan with the patient. The patient was provided  an opportunity to ask questions and all were answered. The patient agreed with the plan and demonstrated an understanding of the instructions.   The patient was advised to call back or seek an in-person evaluation if the symptoms worsen or if the condition fails to improve as anticipated.  I provided 24 minutes of non-face-to-face time during this encounter.   Lauraine Rinne, NP

## 2019-12-28 ENCOUNTER — Ambulatory Visit (INDEPENDENT_AMBULATORY_CARE_PROVIDER_SITE_OTHER): Payer: 59 | Admitting: Pulmonary Disease

## 2019-12-28 ENCOUNTER — Encounter: Payer: Self-pay | Admitting: Pulmonary Disease

## 2019-12-28 ENCOUNTER — Other Ambulatory Visit: Payer: Self-pay

## 2019-12-28 ENCOUNTER — Telehealth: Payer: Self-pay | Admitting: Family Medicine

## 2019-12-28 DIAGNOSIS — R05 Cough: Secondary | ICD-10-CM

## 2019-12-28 DIAGNOSIS — R059 Cough, unspecified: Secondary | ICD-10-CM

## 2019-12-28 LAB — PULMONARY FUNCTION TEST
DL/VA % pred: 67 %
DL/VA: 2.89 ml/min/mmHg/L
DLCO cor % pred: 69 %
DLCO cor: 18.92 ml/min/mmHg
DLCO unc % pred: 69 %
DLCO unc: 18.92 ml/min/mmHg
FEF 25-75 Post: 4.56 L/sec
FEF 25-75 Pre: 3.92 L/sec
FEF2575-%Change-Post: 16 %
FEF2575-%Pred-Post: 150 %
FEF2575-%Pred-Pre: 129 %
FEV1-%Change-Post: 4 %
FEV1-%Pred-Post: 110 %
FEV1-%Pred-Pre: 105 %
FEV1-Post: 3.94 L
FEV1-Pre: 3.77 L
FEV1FVC-%Change-Post: 1 %
FEV1FVC-%Pred-Pre: 104 %
FEV6-%Change-Post: 3 %
FEV6-%Pred-Post: 108 %
FEV6-%Pred-Pre: 105 %
FEV6-Post: 4.88 L
FEV6-Pre: 4.73 L
FEV6FVC-%Change-Post: 0 %
FEV6FVC-%Pred-Post: 104 %
FEV6FVC-%Pred-Pre: 104 %
FVC-%Change-Post: 2 %
FVC-%Pred-Post: 104 %
FVC-%Pred-Pre: 101 %
FVC-Post: 4.89 L
FVC-Pre: 4.75 L
Post FEV1/FVC ratio: 81 %
Post FEV6/FVC ratio: 100 %
Pre FEV1/FVC ratio: 79 %
Pre FEV6/FVC Ratio: 100 %
RV % pred: 78 %
RV: 1.66 L
TLC % pred: 95 %
TLC: 6.49 L

## 2019-12-28 NOTE — Patient Instructions (Addendum)
You were seen today by Lauraine Rinne, NP  for:   1. Cough  - CT Chest High Resolution; Future  I believe we need to proceed forward with a CT of your chest.  To further evaluate cough as well as your exposure history given the diffusion defect on your pulmonary function test.  I will discuss the case with Dr. Valeta Harms.  If your CT does show any sort of abnormal imaging we will need to have you come to complete an interstitial lung disease questionnaire  We recommend today:  Orders Placed This Encounter  Procedures  . CT Chest High Resolution    -High-res CT with supine and prone positioning -inspiratory and expiratory cuts.  -Only to be read by Dr. Rosario Jacks and Dr. Weber Cooks.    Standing Status:   Future    Standing Expiration Date:   12/27/2020    Order Specific Question:   Preferred imaging location?    Answer:   [facility]    Order Specific Question:   Radiology Contrast Protocol - do NOT remove file path    Answer:   [email or web address]   Orders Placed This Encounter  Procedures  . CT Chest High Resolution   No orders of the defined types were placed in this encounter.   Follow Up:    Return in about 3 months (around 03/29/2020), or if symptoms worsen or fail to improve, for Follow up with Dr. Valeta Harms.   Please do your part to reduce the spread of COVID-19:      Reduce your risk of any infection  and COVID19 by using the similar precautions used for avoiding the common cold or flu:  Marland Kitchen Wash your hands often with soap and warm water for at least 20 seconds.  If soap and water are not readily available, use an alcohol-based hand sanitizer with at least 60% alcohol.  . If coughing or sneezing, cover your mouth and nose by coughing or sneezing into the elbow areas of your shirt or coat, into a tissue or into your sleeve (not your hands). Langley Gauss A MASK when in public  . Avoid shaking hands with others and consider head nods or verbal greetings  only. . Avoid touching your eyes, nose, or mouth with unwashed hands.  . Avoid close contact with people who are sick. . Avoid places or events with large numbers of people in one location, like concerts or sporting events. . If you have some symptoms but not all symptoms, continue to monitor at home and seek medical attention if your symptoms worsen. . If you are having a medical emergency, call 911.   [place] / e-Visit: [email or web address]         [facility]: [facility]: [id number]                   [facility]: [id number]     It is flu season:   >>> Best ways to protect herself from the flu: Receive the yearly flu vaccine, practice good hand hygiene washing with soap and also using hand sanitizer when available, eat a nutritious meals, get adequate rest, hydrate appropriately   Please contact the office if your symptoms worsen or you have concerns that you are not improving.   Thank you for choosing [facility] for your healthcare, and for allowing [place] to partner with you on your healthcare journey. I  am thankful to be able to provide care to you today.   Wyn Quaker FNP-C

## 2019-12-28 NOTE — Telephone Encounter (Signed)
Would you like me to call for f/u

## 2019-12-28 NOTE — Telephone Encounter (Signed)
Yes thanks-it looks like patient saw the pulmonary nurse practitioner who recommended CT-he also specifically consulted with the pulmonologist who also recommended this-I would concur to be on the safe side.  Certainly there is some radiation but 1 to make sure to not be missing some underlying illness like interstitial lung disease

## 2019-12-28 NOTE — Telephone Encounter (Signed)
Patient is calling in stating he went to see his pulmonary doctor and had a pulmonary test done and was told he would need a CT scan done but is a little hesitant to have it done so he would like Dr.Hunters opinion on those results and expose to radiation.

## 2019-12-28 NOTE — Assessment & Plan Note (Signed)
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  Given cough as well as exposure history and diffusion defect I do believe it is reasonable to obtain CT on axial imaging.  It is reassuring the patient's cough has improved.  He attributes this to antibiotic use.  Unfortunately though without documented imaging unsure if patient may have underlying interstitial lung disease that may be waxing and waning.  Patient is very concerned regarding radiation risk.  Patient's last chest imaging was a chest x-ray in May/2020 that I can see the radiology report showing no acute cardiopulmonary disease.  Unfortunately patient has not had a CT chest.  We will discuss case with Dr. Valeta Harms at patient's request who would like the "doctors opinion".  Plan: Proceed forward with high-resolution CT chest We will discuss case with Dr. Valeta Harms 34-monthfollow-up with Dr. IValeta Harms

## 2019-12-28 NOTE — Progress Notes (Signed)
Full PFT performed today. °

## 2019-12-29 NOTE — Telephone Encounter (Signed)
Called and spoke with pt and gave him the below message.

## 2020-01-02 ENCOUNTER — Telehealth: Payer: Self-pay | Admitting: Pulmonary Disease

## 2020-01-02 DIAGNOSIS — R059 Cough, unspecified: Secondary | ICD-10-CM

## 2020-01-02 NOTE — Telephone Encounter (Signed)
01/02/2020  Sorry to hear the patient is still having questions regarding this.  We discussed this at length.  Due to his ongoing concerns regarding radiation I believe this is best handled by having him discuss his pulmonary function test as well as his concerns regarding radiation with Dr. Valeta Harms specifically.  Please schedule him for next available with Dr. Valeta Harms.  We can hold off on a high-resolution CT chest until then.  Patient needs to understand that his CT abdomen does not fully evaluate his chest.  Especially given the ongoing other exposures given chemicals, birds etc.  We have discussed this extensively with the patient.  Please contact the patient and let him know that we will hold off on a high-resolution CT chest.  Please schedule him for next available with Dr. Valeta Harms.  If there is nothing available please put a recall in for first available when Dr. Valeta Harms gives his next scheduled.  Will route message to Dr. Valeta Harms as Juluis Rainier.  Wyn Quaker, FNP

## 2020-01-02 NOTE — Telephone Encounter (Signed)
Spoke with pt, states he is very concerned about undergoing a CT chest.  States he had a CT abdomen in February 2021 post appendectomy, and is concerned about being exposed to this much radiation in a relatively short amount of time.  Pt reviewed his 5/27 office visit note with me in detail, stating that he believes that his DLCO was falsely low because of the breathing conditions of being in the pleth box for the PFT.  Pt wonders if there's a way to test his DLCO again and see if it is higher than the PFT showed before undergoing a CT.  Pt also wonders if there is another way to test for ILD that excludes means of radiation.    Pt would like both Calvin Adams and Calvin Adams opinion on this.   Please advise, thanks!

## 2020-01-02 NOTE — Telephone Encounter (Signed)
lmtcb for pt.  

## 2020-01-02 NOTE — Telephone Encounter (Signed)
Pt returning a phone call back. Pt can be reached at [phone]

## 2020-01-02 NOTE — Telephone Encounter (Signed)
I spoke with pt and gave message from [place]. He agreed to come in and talk to Dr. Valeta Harms about his questions. Unfortunately BI doesn't have any openings. Can we add him on 01/10/2020? You have some held spots but I think you are overbooked. BI please advise.

## 2020-01-03 NOTE — Telephone Encounter (Signed)
Called spoke with patient.  Put packet in the mail for patient and explained to him to fill it out and told him we would call to get him scheduled once we had July schedule   Patient voiced understanding,  Will route to front desk pool to get patient scheduled once MR's schedule is completed for July

## 2020-01-03 NOTE — Telephone Encounter (Signed)
MR does not have any more openings for ILD for June and no scheduled out for July yet.   Will route to MR, BI, and BPM as FYI

## 2020-01-03 NOTE — Telephone Encounter (Signed)
oknoted.  I believe a recall for an ILD clinic slot would be okay if the patient agrees.  I would also proceed forward with sending the ILD questionnaire so that way this can be completed and available for when the patient will hopefully see Dr. Chase Caller in July/2021.Wyn Quaker, FNP

## 2020-01-03 NOTE — Telephone Encounter (Signed)
MR please advise if you want pt to have CT before he sees you.

## 2020-01-03 NOTE — Telephone Encounter (Signed)
01/03/2020  Discussed case with Dr. Valeta Harms.    Dr. Juline Patch suggestion would be to get the patient in for a second opinion with Dr. Chase Caller in an ILD consult slot.  When we get the patient scheduled in a 30-minute slot please also mail him the ILD questionnaire for him to bring in completed when he meets Dr. Chase Caller.  At that point in time can decide what would be the most appropriate plan of care moving forward for the patient.  Per Dr. Valeta Harms he is already had some conversations with Dr. Chase Caller regarding the patient already.  We will route to triage for follow-up.  Will route to Dr. Valeta Harms as Juluis Rainier.  Wyn Quaker, FNP

## 2020-01-03 NOTE — Telephone Encounter (Signed)
Patient would like to know if  he needs to schedule CT scan chest before ILD appt.  Patient phone number is [phone].

## 2020-01-04 NOTE — Telephone Encounter (Signed)
I can see him in July 2021.  It is not urgent.  My schedule for July just opened up  Yes let him do the ILD questionnaire because it asked really good questions about exposures.  However, but looking at his chest x-ray from 10 months ago he might not have ILD because chest x-ray suggest hyperinflation.  Therefore to put the issue to rest and definitely before seeing me -please get high-resolution CT chest supine and prone inspiratory and expiratory images [indication dyspnea]

## 2020-01-05 ENCOUNTER — Telehealth: Payer: Self-pay | Admitting: Internal Medicine

## 2020-01-05 NOTE — Telephone Encounter (Signed)
I respect his position on CT scan of the chest and radiation.  Therefore, I will be happy to see him first without a CT chest and then we can decide based on face-to-face discussion and shared decision making

## 2020-01-05 NOTE — Telephone Encounter (Signed)
Message from Dr. Chase Caller given to patient.   Per Dr. Chase Caller, I respect his position on CT scan of the chest and radiation.  Therefore, I will be happy to see him first without a CT chest and then we can decide based on face-to-face discussion and shared decision making.  Patient verbalized understanding of message and will speak to Dr. Chase Caller at next office visit.

## 2020-01-05 NOTE — Telephone Encounter (Signed)
When I called the patient today to try and reschedule the High Res CT he is still concerned about the amount of radiation that he will be exposed to. He had a CT of the abdomen 09/08/19 at [facility] in [place].  He has been doing his own research about the amounts of radiation the CT abdomen gives.  He is not really having the issues right now that he was earlier this year.  He was put on doxycycline before he had his CT abdomen done 09/08/19. He only has a cough first thing in the mornings.  Dr. Quincy Simmonds with [facility] did a chest xray last summer. He states that if you really feel that the High Res CT is necessary then he will get the CT scheduled.  He states that MR is the 4th doctor to state he needs CT of the chest

## 2020-01-05 NOTE — Telephone Encounter (Signed)
Spoke with the pt  I scheduled appt with MR for 02/20/20 and ordered the HRCT to be done prior

## 2020-01-05 NOTE — Telephone Encounter (Signed)
Patient is returning phone call. Patient phone number is [phone].

## 2020-01-05 NOTE — Telephone Encounter (Signed)
Tried calling pt and he did not answer- had to [facility]

## 2020-01-08 ENCOUNTER — Other Ambulatory Visit: Payer: 59

## 2020-01-08 NOTE — Telephone Encounter (Signed)
PCCM:  Thanks for helping coordinate. Agree HRCT needs to be done   Garner Nash, DO [facility] 01/08/2020 8:17 AM

## 2020-01-15 NOTE — Progress Notes (Signed)
PCCM: Case discussed with BM. Agreed.  Garner Nash, DO [facility] 01/15/2020 5:51 PM

## 2020-01-29 ENCOUNTER — Telehealth: Payer: Self-pay | Admitting: Internal Medicine

## 2020-01-29 NOTE — Telephone Encounter (Signed)
A high-resolution CT chest supine and prone regardless of having had CT abdomen is gold standard in order to even start the discussion on interstitial lung disease.  He is worried about radiation risk and therefore is that that I would see him without the high-resolution CT chest but in reality in an ideal situation he needs a high-resolution CT chest supine and prone.  He had pulmonary function test and they appear fairly accurate in terms of technical performance.  We will have to put all this with the symptoms together.

## 2020-01-29 NOTE — Telephone Encounter (Signed)
Spoke with the pt  He has consult 02/20/20 for ILD  He was scheduled for HRCT and cancelled it bc he had CT ABD in Feb 2021 at [facility]  He is now thinking that he should have this HRCT done prior to his appt with MR and wants to know MR's thoughtts on how this will impact his appt if he does not have this done, and also how safe this is considering the radiation he had in Feb 2021. He states concerned that he can not get in a good, deep breath and would like to figure out why. He had PFT and wants to know how accurate those results were "how much room for error is there"? MR- please advise, thanks!!

## 2020-01-30 NOTE — Telephone Encounter (Signed)
Spoke with pt. He is aware of Dr. Golden Pop response. Pt has agreed to go ahead with the HRCT.  [facility] - can you please schedule this, there is an order in the system. Thanks.

## 2020-01-31 NOTE — Telephone Encounter (Signed)
Msg sent to [facility] to schedule ct

## 2020-02-02 ENCOUNTER — Ambulatory Visit (INDEPENDENT_AMBULATORY_CARE_PROVIDER_SITE_OTHER)
Admission: RE | Admit: 2020-02-02 | Discharge: 2020-02-02 | Disposition: A | Discharge status: Home / Self Care | Payer: 59 | Source: Ambulatory Visit | Attending: Internal Medicine | Admitting: Internal Medicine

## 2020-02-02 ENCOUNTER — Other Ambulatory Visit: Payer: Self-pay

## 2020-02-02 DIAGNOSIS — R05 Cough: Secondary | ICD-10-CM

## 2020-02-02 DIAGNOSIS — R059 Cough, unspecified: Secondary | ICD-10-CM

## 2020-02-20 ENCOUNTER — Other Ambulatory Visit: Payer: Self-pay

## 2020-02-20 ENCOUNTER — Encounter: Payer: Self-pay | Admitting: Internal Medicine

## 2020-02-20 ENCOUNTER — Ambulatory Visit (INDEPENDENT_AMBULATORY_CARE_PROVIDER_SITE_OTHER): Payer: 59 | Admitting: Internal Medicine

## 2020-02-20 VITALS — BP 124/64 | HR 67 | Temp 98.1°F | Ht 69.0 in | Wt 169.0 lb

## 2020-02-20 DIAGNOSIS — Z7729 Contact with and (suspected ) exposure to other hazardous substances: Secondary | ICD-10-CM

## 2020-02-20 DIAGNOSIS — M2550 Pain in unspecified joint: Secondary | ICD-10-CM | POA: Diagnosis not present

## 2020-02-20 DIAGNOSIS — W57XXXA Bitten or stung by nonvenomous insect and other nonvenomous arthropods, initial encounter: Secondary | ICD-10-CM

## 2020-02-20 DIAGNOSIS — Z77120 Contact with and (suspected) exposure to mold (toxic): Secondary | ICD-10-CM | POA: Diagnosis not present

## 2020-02-20 DIAGNOSIS — R05 Cough: Secondary | ICD-10-CM

## 2020-02-20 DIAGNOSIS — L52 Erythema nodosum: Secondary | ICD-10-CM | POA: Diagnosis not present

## 2020-02-20 DIAGNOSIS — R053 Chronic cough: Secondary | ICD-10-CM

## 2020-02-20 LAB — CBC WITH DIFFERENTIAL/PLATELET
Basophils Absolute: 0 10*3/uL (ref 0.0–0.1)
Basophils Relative: 0.8 % (ref 0.0–3.0)
Eosinophils Absolute: 0.2 10*3/uL (ref 0.0–0.7)
Eosinophils Relative: 3.8 % (ref 0.0–5.0)
HCT: 41.9 % (ref 39.0–52.0)
Hemoglobin: 14.5 g/dL (ref 13.0–17.0)
Lymphocytes Relative: 18.5 % (ref 12.0–46.0)
Lymphs Abs: 0.8 10*3/uL (ref 0.7–4.0)
MCHC: 34.5 g/dL (ref 30.0–36.0)
MCV: 90 fl (ref 78.0–100.0)
Monocytes Absolute: 0.4 10*3/uL (ref 0.1–1.0)
Monocytes Relative: 9.9 % (ref 3.0–12.0)
Neutro Abs: 2.9 10*3/uL (ref 1.4–7.7)
Neutrophils Relative %: 67 % (ref 43.0–77.0)
Platelets: 207 10*3/uL (ref 150.0–400.0)
RBC: 4.66 Mil/uL (ref 4.22–5.81)
RDW: 12.9 % (ref 11.5–15.5)
WBC: 4.4 10*3/uL (ref 4.0–10.5)

## 2020-02-20 LAB — SEDIMENTATION RATE: Sed Rate: 1 mm/hr (ref 0–20)

## 2020-02-20 MED ORDER — DOXYCYCLINE HYCLATE 100 MG PO TABS
100.0000 mg | ORAL_TABLET | Freq: Two times a day (BID) | ORAL | 0 refills | Status: DC
Start: 2020-02-20 — End: 2020-02-26

## 2020-02-20 MED ORDER — BREO ELLIPTA 100-25 MCG/INH IN AEPB
1.0000 | INHALATION_SPRAY | Freq: Every day | RESPIRATORY_TRACT | 0 refills | Status: DC
Start: 2020-02-20 — End: 2020-05-23

## 2020-02-20 NOTE — Progress Notes (Signed)
APril 2021  Synopsis: Referred in April 2021 for cough.  By Marin Olp, MD  Subjective:   PATIENT ID: Dustin Flock GENDER: male DOB: 01-13-1961, MRN: [id number]  Chief Complaint  Patient presents with  . Pulmonary Consult    Referred for persistent cough. Patient reports that he has a constant cough that has lingered for 1 year. He states that its worse in the morning and in the evenings.     59 year old gentleman past medical history of allergies hyperlipidemia.  Patient presents as a referral for persistent cough.  Patient seen by primary care April 2021 for left-sided back pain.  Patient last seen in January by Dr. Yong Channel with issues regarding chronic cough.  Patient was seen by Dr. Quincy Simmonds at [facility] in the past.  Per Dr. Ansel Bong notes.  Thought to be related to chronic nasal drainage.  Patient does have a few parrots in his home.  He was treated with doxycycline and azithromycin in the past.  He was planning to have pulmonary function test due to cost this was held.  He has had 4 - Covid tests.  Plans for referral to pulmonary were made. Occupation: flipping houses Visual merchandiser.  Of note the patient has a 5 birds in his home.  He has routinely cared for birds for many years.  Prior occupational history with chemical exposure.  He works for a [employer] company.  At this time his cough is improved.  He had a upper respiratory tract type infection and was sick admitted to the hospital with appendicitis and February.  His cough now is only in the morning.  It is worse if he sleeps on his back.  He does have reflux and takes medications for this on occasion.   Patient reports the cough is better.  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  Patient also has a personal history of owning birds for many years.  He has exposures to 5 birds that live in his home and  he cares for 3 large birds and 2 small birds.  He also now works as a Engineer, technical sales homes.   OV 02/20/2020 - transfer of care from Dr Valeta Harms to Dr Chase Caller  Subjective:  Patient ID: Dustin Flock, male , DOB: January 23, 1961 , age 11 y.o. , MRN: [id number] , ADDRESS: [address] [place] [address]   02/20/2020 -   Chief Complaint  Patient presents with  . Follow-up    productive cough in the am only      HPI ASHETON SCHEFFLER 59 y.o. -transfer of care from Dr. Leory Plowman Icard to Dr. Chase Caller.  He saw Dr. Valeta Harms in April 2021.  He was having significant cough.  Her pulmonary function test that showed reduced DLCO.  He has a strong history of bird exposure.  Therefore concern was for interstitial lung disease.  Therefore he has been referred here.  In the interim wwe recommended high-resolution CT chest.  He was initially reluctant but ultimately ended up having it done.  I personally visualized this he does not have interstitial lung disease.  He has air trapping.  He tells me that a few years ago his then primary care physician put him on testosterone replacement.  Discussed some shortness of breath.  Then he stopped it.  Then last year for most of 2020 he was having chronic cough.  This in  the setting of significant bird exposure.  He has many parents.  In the remote past even had some chemical exposure at work.  By the end of 2020 there was concern for "psittacosis".  He was given empiric doxycycline.  He never had any fever.  But the doxycycline course was 1 week.  It did improve his cough significantly.  A second course was repeated in January and the cough again improved.  However he is not back to baseline.  He rarely has some shortness of breath currently but occasionally does have.  He is also be set with chronic arthralgia particularly when he wakes up.  In 2013 his rheumatoid factor was negative.  His cough currently is significantly improved but still persistent in the  morning.  Is mostly dry but a year ago he did have green sputum.  Review of his labs show some elevation in eosinophils.  His walking test is normal today.  He is interested in another course of doxycycline empiric treatment.  We took a shared decision making to support this.  He wants to get to the bottom of all his symptoms.  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  He is wondering if arthralgia could be because of this.  He also reports a chronic history of on and off erythema nodosum.     Simple office walk 185 feet x  3 laps goal with forehead probe 02/20/2020   O2 used ra  Number laps completed 3  Comments about pace fast  Resting Pulse Ox/HR 100% and 78/min  Final Pulse Ox/HR 98% and 88/min  Desaturated </= 88% no  Desaturated <= 3% points no  Got Tachycardic >/= 90/min no  Symptoms at end of test nox  Miscellaneous comments x   xxxxxxxxxxxxxxxxxxxxxxxxxx Results for GUS, LITTLER (MRN [id number]) as of 02/20/2020 13:37  Ref. Range 02/18/2012 08:09 07/31/2014 08:00 01/31/2016 08:43 02/08/2017 07:58 05/12/2017 12:40  Eosinophils Absolute Latest Ref Range: 0 - 0 K/uL 0.2 0.1 0.2 0.5 0.3     xxxxxxxxxxxxxxxxxxxxxxxxxxxxx IMPRESSIO- CT chest High resolution No evidence of fibrotic interstitial lung disease. No significant air trapping on expiratory phase imaging.   Electronically Signed   By: Eddie Candle M.D.   On: 02/05/2020 18:22  Results for AMEL, GIANINO (MRN [id number]) as of 02/20/2020 13:37  Ref. Range 12/28/2019 11:43  FVC-Pre Latest Units: L 4.75  FVC-%Pred-Pre Latest Units: % 101   Results for JASH, WAHLEN (MRN [id number]) as of 02/20/2020 13:37  Ref. Range 12/28/2019 11:43  TLC Latest Units: L 6.49  TLC % pred Latest Units: % 95  Results for AZAI, GAFFIN (MRN [id number]) as of 02/20/2020 13:37  Ref. Range 12/28/2019 11:43  DLCO cor Latest Units: ml/min/mmHg 18.92  DLCO cor % pred Latest Units: % 69    ROS - per HPI  Results for LISSANDRO, DILORENZO (MRN [id number]) as of 02/20/2020 13:37  Ref. Range 07/12/2018 08:18  Creatinine Latest Ref Range: 0.40 - 1.50 mg/dL 1.04  Results for DUWAN, ADRIAN (MRN [id number]) as of 02/20/2020 13:37  Ref. Range 11/23/2011 14:32  Anti Nuclear Antibody (ANA) Latest Ref Range: NEGATIVE  NEG  RA Latex Turbid. Latest Ref Range: <=14 IU/mL <10     has a past medical history of Allergy, Arthritis, Hyperlipidemia, and Umbilical hernia.   reports that he has never smoked. He has never used smokeless tobacco.  Past Surgical History:  Procedure Laterality Date  .  spermaticeal removal     in [place], [place]  . UMBILICAL HERNIA REPAIR  2013    No Known Allergies  Immunization History  Administered Date(s) Administered  . Td 08/04/2003  . Tdap 03/14/2014  . Zoster Recombinat (Shingrix) 08/19/2017, 10/23/2017    Family History  Problem Relation Age of Onset  . Hypertension Mother   . Brain cancer Father        brain tumor, died before patient was born  . Diabetes Sister   . Breast cancer Maternal Aunt        several aunts  . Colon cancer Neg Hx   . Esophageal cancer Neg Hx   . Rectal cancer Neg Hx   . Stomach cancer Neg Hx   . Other Neg Hx        hypogonadism     Current Outpatient Medications:  .  fenofibrate 160 MG tablet, Take 1 tablet (160 mg total) by mouth daily., Disp: 90 tablet, Rfl: 1 .  albuterol (VENTOLIN HFA) 108 (90 Base) MCG/ACT inhaler, Inhale 2 puffs into the lungs every 6 (six) hours as needed.  (Patient not taking: Reported on 02/20/2020), Disp: , Rfl:  .  budesonide-formoterol (SYMBICORT) 160-4.5 MCG/ACT inhaler, Inhale 2 puffs into the lungs 2 (two) times daily as needed (asthma flare). (Patient not taking: Reported on 11/16/2019), Disp: 1 Inhaler, Rfl: 3 .  cyclobenzaprine (FLEXERIL) 10 MG tablet, Take 1 tablet (10 mg total) by mouth 3 (three) times daily as needed for muscle spasms. (Patient not taking: Reported on  02/20/2020), Disp: 60 tablet, Rfl: 0      Objective:   Vitals:   02/20/20 1327  BP: 124/64  Pulse: 67  Temp: 98.1 F (36.7 C)  TempSrc: Oral  SpO2: 97%  Weight: 169 lb (76.7 kg)  Height: _0  (1.753 m)    Estimated body mass index is 24.96 kg/m as calculated from the following:   Height as of this encounter: _1  (1.753 m).   Weight as of this encounter: 169 lb (76.7 kg).  _2 @  [employer]   02/20/20 1327  Weight: 169 lb (76.7 kg)     Physical Exam  General Appearance:    Alert, cooperative, no distress, appears stated age - yes , Deconditioned looking - no , OBESE  - no, Sitting on Wheelchair -  no  Head:    Normocephalic, without obvious abnormality, atraumatic  Eyes:    PERRL, conjunctiva/corneas clear,  Ears:    Normal TM's and external ear canals, both ears  Nose:   Nares normal, septum midline, mucosa normal, no drainage    or sinus tenderness. OXYGEN ON  - no . Patient is @ ra   Throat:   Lips, mucosa, and tongue normal; teeth and gums normal. Cyanosis on lips - no  Neck:   Supple, symmetrical, trachea midline, no adenopathy;    thyroid:  no enlargement/tenderness/nodules; no carotid   bruit or JVD  Back:     Symmetric, no curvature, ROM normal, no CVA tenderness  Lungs:     Distress - no , Wheeze no, Barrell Chest - no, Purse lip breathing - no, Crackles - no   Chest Wall:    No tenderness or deformity.    Heart:    Regular rate and rhythm, S1 and S2 normal, no rub   or gallop, Murmur - no  Breast Exam:    NOT DONE  Abdomen:     Soft, non-tender, bowel sounds active all four quadrants,  no masses, no organomegaly. Visceral obesity - no  Genitalia:   NOT DONE  Rectal:   NOT DONE  Extremities:   Extremities - normal, Has Cane - no, Clubbing - no, Edema - no  Pulses:   2+ and symmetric all extremities  Skin:   Stigmata of Connective Tissue Disease - no  Lymph nodes:   Cervical, supraclavicular, and axillary nodes normal  Psychiatric:   Neurologic:   Pleasant - yes, Anxious - no, Flat affect - no  CAm-ICU - neg, Alert and Oriented x 3 - yes, Moves all 4s - yes, Speech - normal, Cognition - intact           Assessment:       ICD-10-CM   1. Chronic cough  R05   2. Long-term exposure involving bird droppings  Z77.29   3. Tick bite, initial encounter  W57.XXXA   4. Contact with or exposure to mold  Z77.120   5. Arthralgia, unspecified joint  M25.50   6. Erythema nodosum  L52    He is at significant risk for interstitial lung disease but currently does not have it.  He seems to be behaving like cough variant asthma.  Psittacosis and acute illness and he does not have features for this but he is interested in empiric doxycycline course for 2 weeks.  I am supportive of this.  He is aware of the side effects of doxycycline.  We will have autoimmune test done.  Given the history of erythema nodosum and arthralgia will need to rule out rheumatoid arthritis and sarcoid.  We will also commit to empiric inhaled steroid treatment.  We will review after work-up is complete.    Plan:     Patient Instructions     ICD-10-CM   1. Chronic cough  R05   2. Long-term exposure involving bird droppings  Z77.29   3. Tick bite, initial encounter  W57.XXXA   4. Arthralgia, unspecified joint  M25.50   5. Contact with or exposure to mold  Z77.120   6. Erythema nodosum  L52     Empiric Take doxycycline 162m po twice daily x 14 days; take after meals and avoid sunlight  Start breo 100/25 , 1 puff once daily   Do blood work for Lyme antibody, ACE, ESR, RF, ANA, CCP, ssa/ssb, scl-70, Hypersensitivity pneumonitis panel 02/20/2020   Do cbc with diff, RAST allergy panel, blood IgE 02/20/2020    Followup  - 4 - 6 weeks with APP or Dr RChase Callerfor face to face    ( Level 05 visit: Estb 40-54 min  in  visit type: on-site physical face to visit  in total care time and counseling or/and coordination of care by this undersigned MD - Dr  MBrand Males This includes one or more of the following on this same day 02/20/2020: pre-charting, chart review, note writing, documentation discussion of test results, diagnostic or treatment recommendations, prognosis, risks and benefits of management options, instructions, education, compliance or risk-factor reduction. It excludes time spent by the CCrossnoreor office staff in the care of the patient. Actual time 40 min)    SIGNATURE    Dr. MBrand Males M.D., F.C.C.P,  Pulmonary and Critical Care Medicine Staff Physician, CRancho BanqueteDirector - Interstitial Lung Disease  Program  [place]ulmonary [place] [id number] Pager: [phone] If no answer or between  15:00h - 7:00h: call [phone] Telephone:  [phone]  2:18 PM 02/20/2020

## 2020-02-20 NOTE — Patient Instructions (Addendum)
ICD-10-CM   1. Chronic cough  R05   2. Long-term exposure involving bird droppings  Z77.29   3. Tick bite, initial encounter  W57.XXXA   4. Arthralgia, unspecified joint  M25.50   5. Contact with or exposure to mold  Z77.120   6. Erythema nodosum  L52     Empiric Take doxycycline 129m po twice daily x 14 days; take after meals and avoid sunlight  Start breo 100/25 , 1 puff once daily   Do blood work for Lyme antibody, ACE, ESR, RF, ANA, CCP, ssa/ssb, scl-70, Hypersensitivity pneumonitis panel 02/20/2020   Do cbc with diff, RAST allergy panel, blood IgE 02/20/2020    Followup  - 4 - 6 weeks with APP or Dr RChase Callerfor face to face

## 2020-02-22 LAB — CYCLIC CITRUL PEPTIDE ANTIBODY, IGG: Cyclic Citrullin Peptide Ab: 31 UNITS — ABNORMAL HIGH

## 2020-02-22 LAB — ANA: Anti Nuclear Antibody (ANA): NEGATIVE

## 2020-02-22 LAB — SJOGREN'S SYNDROME ANTIBODS(SSA + SSB)
SSA (Ro) (ENA) Antibody, IgG: <1 AI
SSB (La) (ENA) Antibody, IgG: <1 AI

## 2020-02-22 LAB — ANTI-SCLERODERMA ANTIBODY: Scleroderma (Scl-70) (ENA) Antibody, IgG: <1 AI

## 2020-02-22 LAB — RHEUMATOID FACTOR: Rheumatoid fact SerPl-aCnc: <14 IU/mL (ref <14)

## 2020-02-23 LAB — ALLERGEN PANEL (27) + IGE
Alternaria Alternata IgE: <0.1 kU/L
Aspergillus Fumigatus IgE: <0.1 kU/L
Bahia Grass IgE: <0.1 kU/L
Bermuda Grass IgE: <0.1 kU/L
Cat Dander IgE: <0.1 kU/L
Cedar, Mountain IgE: <0.1 kU/L
Cladosporium Herbarum IgE: <0.1 kU/L
Cocklebur IgE: <0.1 kU/L
Cockroach, American IgE: <0.1 kU/L
Common Silver Birch IgE: <0.1 kU/L
D Farinae IgE: 0.45 kU/L — AB
D Pteronyssinus IgE: 0.54 kU/L — AB
Dog Dander IgE: <0.1 kU/L
Elm, American IgE: <0.1 kU/L
Hickory, White IgE: <0.1 kU/L
IgE (Immunoglobulin E), Serum: 29 IU/mL (ref 6–495)
Johnson Grass IgE: <0.1 kU/L
Kentucky Bluegrass IgE: <0.1 kU/L
Maple/Box Elder IgE: <0.1 kU/L
Mucor Racemosus IgE: <0.1 kU/L
Oak, White IgE: <0.1 kU/L
Penicillium Chrysogen IgE: <0.1 kU/L
Pigweed, Rough IgE: <0.1 kU/L
Plantain, English IgE: <0.1 kU/L
Ragweed, Short IgE: <0.1 kU/L
Setomelanomma Rostrat: <0.1 kU/L
Timothy Grass IgE: <0.1 kU/L
White Mulberry IgE: <0.1 kU/L

## 2020-02-23 LAB — HYPERSENSITIVITY PNEUMONITIS
A. Pullulans Abs: NEGATIVE
A.Fumigatus #1 Abs: NEGATIVE
Micropolyspora faeni, IgG: NEGATIVE
Pigeon Serum Abs: NEGATIVE
Thermoact. Saccharii: NEGATIVE
Thermoactinomyces vulgaris, IgG: NEGATIVE

## 2020-02-26 ENCOUNTER — Telehealth: Payer: Self-pay | Admitting: Internal Medicine

## 2020-02-26 MED ORDER — DOXYCYCLINE HYCLATE 100 MG PO TABS: 100.0000 mg | ORAL_TABLET | Freq: Two times a day (BID) | ORAL | 0 refills | Status: DC

## 2020-02-26 NOTE — Telephone Encounter (Signed)
Spoke with pt. Pt states that he only received #14 tabs of Doxycycline to take Bid. Pt states his instructions from Dr Chase Caller were to take doxycycline 1 tablet bid x 14 days. I advised that pt I have sent another rx for #14 tablets to be taken bid for a total of 14 days. Pt verbalized understanding. Nothing further needed.

## 2020-03-14 ENCOUNTER — Other Ambulatory Visit: Payer: Self-pay | Admitting: Family Medicine

## 2020-03-17 NOTE — Progress Notes (Signed)
Other than for trace positive dust mite and weak positive rehumatoid arthritis antibody called CCP results negative. If he has joint pains he could consider seeing a rheumatologist

## 2020-04-02 ENCOUNTER — Encounter: Payer: Self-pay | Admitting: Internal Medicine

## 2020-04-02 ENCOUNTER — Other Ambulatory Visit: Payer: Self-pay

## 2020-04-02 ENCOUNTER — Ambulatory Visit (INDEPENDENT_AMBULATORY_CARE_PROVIDER_SITE_OTHER): Payer: 59 | Admitting: Internal Medicine

## 2020-04-02 VITALS — BP 120/60 | HR 68 | Temp 97.7°F | Ht 68.0 in | Wt 168.4 lb

## 2020-04-02 DIAGNOSIS — M2550 Pain in unspecified joint: Secondary | ICD-10-CM

## 2020-04-02 DIAGNOSIS — R05 Cough: Secondary | ICD-10-CM

## 2020-04-02 DIAGNOSIS — R053 Chronic cough: Secondary | ICD-10-CM

## 2020-04-02 DIAGNOSIS — R7989 Other specified abnormal findings of blood chemistry: Secondary | ICD-10-CM | POA: Diagnosis not present

## 2020-04-02 DIAGNOSIS — Z7729 Contact with and (suspected ) exposure to other hazardous substances: Secondary | ICD-10-CM

## 2020-04-02 DIAGNOSIS — R768 Other specified abnormal immunological findings in serum: Secondary | ICD-10-CM

## 2020-04-02 NOTE — Patient Instructions (Addendum)
chronic cough  -At this point in time no explanation for May 2021 isolated reduction in diffusion capacity on pulmonary function test -CT scan of the chest without any pulmonary fibrosis or interstitial lung disease or inflammation -Suspect lot of the cough is related to cough neuropathy or irritable larynx syndrome  Plan  -We discussed various options we took a shared decision making that we will follow this expectantly - do recommend safe bird exposure  Arthralgia, unspecified joint Cyclic citrullinated peptide (CCP) antibody positive  -There is a possibility that your early morning joint stiffness and joint pains are related to rheumatoid arthritis based on trace positive CCP antibody  Plan -Discussed with your primary care physician about referral to rheumatology  Follow-up -Recommend 1 year follow-up with repeat pulmonary function test but respect your decision to follow this expectantly -you can return as needed.

## 2020-04-02 NOTE — Progress Notes (Signed)
APril 2021  Synopsis: Referred in April 2021 for cough.  By Calvin Olp, MD  Subjective:   PATIENT ID: Calvin Adams GENDER: male DOB: 1961/05/03, MRN: [id number]  Chief Complaint  Patient presents with   Pulmonary Consult    Referred for persistent cough. Patient reports that he has a constant cough that has lingered for 1 year. He states that its worse in the morning and in the evenings.     59 year old gentleman past medical history of allergies hyperlipidemia.  Patient presents as a referral for persistent cough.  Patient seen by primary care April 2021 for left-sided back pain.  Patient last seen in January by Calvin. Yong Adams with issues regarding chronic cough.  Patient was seen by Calvin. Quincy Adams at [facility] in the past.  Per Calvin. Ansel Adams notes.  Thought to be related to chronic nasal drainage.  Patient does have a few parrots in his home.  He was treated with doxycycline and azithromycin in the past.  He was planning to have pulmonary function test due to cost this was held.  He has had 4 - Covid tests.  Plans for referral to pulmonary were made. Occupation: flipping houses Visual merchandiser.  Of note the patient has a 5 birds in his home.  He has routinely cared for birds for many years.  Prior occupational history with chemical exposure.  He works for a [employer] company.  At this time his cough is improved.  He had a upper respiratory tract type infection and was sick admitted to the hospital with appendicitis and February.  His cough now is only in the morning.  It is worse if he sleeps on his back.  He does have reflux and takes medications for this on occasion.   Patient reports the cough is better.  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  Patient also has a personal history of owning birds for many years.  He has exposures to 5 birds that live in his home and  he cares for 3 large birds and 2 small birds.  He also now works as a Engineer, technical sales homes.   OV 02/20/2020 - transfer of care from Calvin Adams to Calvin Adams  Subjective:  Patient ID: Calvin Adams, male , DOB: 09/21/1960 , age 59 y.o. , MRN: [id number] , ADDRESS: [place] [address]   02/20/2020 -   Chief Complaint  Patient presents with   Follow-up    productive cough in the am only      HPI [facility] 59 y.o. -transfer of care from Calvin. Leory Plowman Adams to Calvin. Chase Adams.  He saw Calvin. Valeta Adams in April 2021.  He was having significant cough.  Her pulmonary function test that showed reduced DLCO.  He has a strong history of bird exposure.  Therefore concern was for interstitial lung disease.  Therefore he has been referred here.  In the interim wwe recommended high-resolution CT chest.  He was initially reluctant but ultimately ended up having it done.  I personally visualized this he does not have interstitial lung disease.  He has air trapping.  He tells me that a few years ago his then primary care physician put him on testosterone replacement.  Discussed some shortness of breath.  Then he stopped it.  Then last year for most of 2020 he was having chronic cough.  This in  the setting of significant bird exposure.  He has many parents.  In the remote past even had some chemical exposure at work.  By the end of 2020 there was concern for "psittacosis".  He was given empiric doxycycline.  He never had any fever.  But the doxycycline course was 1 week.  It did improve his cough significantly.  A second course was repeated in January and the cough again improved.  However he is not back to baseline.  He rarely has some shortness of breath currently but occasionally does have.  He is also be set with chronic arthralgia particularly when he wakes up.  In 2013 his rheumatoid factor was negative.  His cough currently is significantly improved but still persistent in the  morning.  Is mostly dry but a year ago he did have green sputum.  Review of his labs show some elevation in eosinophils.  His walking test is normal today.  He is interested in another course of doxycycline empiric treatment.  We took a shared decision making to support this.  He wants to get to the bottom of all his symptoms.  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  He is wondering if arthralgia could be because of this.  He also reports a chronic history of on and off erythema nodosum.     Simple office walk 185 feet x  3 laps goal with forehead probe 02/20/2020   O2 used ra  Number laps completed 3  Comments about pace fast  Resting Pulse Ox/HR 100% and 78/min  Final Pulse Ox/HR 98% and 88/min  Desaturated </= 88% no  Desaturated <= 3% points no  Got Tachycardic >/= 90/min no  Symptoms at end of test nox  Miscellaneous comments x   xxxxxxxxxxxxxxxxxxxxxxxxxx Results for Calvin, Adams (MRN [id number]) as of 02/20/2020 13:37  Ref. Range 02/18/2012 08:09 07/31/2014 08:00 01/31/2016 08:43 02/08/2017 07:58 05/12/2017 12:40  Eosinophils Absolute Latest Ref Range: 0 - 0 K/uL 0.2 0.1 0.2 0.5 0.3     xxxxxxxxxxxxxxxxxxxxxxxxxxxxx IMPRESSIO- CT chest High resolution No evidence of fibrotic interstitial lung disease. No significant air trapping on expiratory phase imaging.   Electronically Signed   By: Calvin Adams M.D.   On: 02/05/2020 18:22  Results for Calvin, Adams (MRN [id number]) as of 02/20/2020 13:37  Ref. Range 12/28/2019 11:43  FVC-Pre Latest Units: L 4.75  FVC-%Pred-Pre Latest Units: % 101   Results for Calvin, Adams (MRN [id number]) as of 02/20/2020 13:37  Ref. Range 12/28/2019 11:43  TLC Latest Units: L 6.49  TLC % pred Latest Units: % 95  Results for Calvin, Adams (MRN [id number]) as of 02/20/2020 13:37  Ref. Range 12/28/2019 11:43  DLCO cor Latest Units: ml/min/mmHg 18.92  DLCO cor % pred Latest Units: % 69    ROS - per HPI  Results for Calvin, Adams (MRN [id number]) as of 02/20/2020 13:37  Ref. Range 07/12/2018 08:18  Creatinine Latest Ref Range: 0.40 - 1.50 mg/dL 1.04  Results for Calvin, URSIN (MRN [id number]) as of 02/20/2020 13:37  Ref. Range 11/23/2011 14:32  Anti Nuclear Antibody (ANA) Latest Ref Range: NEGATIVE  NEG  RA Latex Turbid. Latest Ref Range: <=14 IU/mL <10    A/P  He is at significant risk for interstitial lung disease but currently does not have it.  He seems to be behaving like cough variant asthma.  Psittacosis and acute illness and he does not have  features for this but he is interested in empiric doxycycline course for 2 weeks.  I am supportive of this.  He is aware of the side effects of doxycycline.  We will have autoimmune test done.  Given the history of erythema nodosum and arthralgia will need to rule out rheumatoid arthritis and sarcoid.  We will also commit to empiric inhaled steroid treatment.  We will review after work-up is complete.       OV 04/02/2020  Subjective:  Patient ID: Calvin Adams, male , DOB: 03/04/1961 , age 77 y.o. , MRN: [id number] , ADDRESS: [address] [place] [address]   04/02/2020 -   Chief Complaint  Patient presents with   Follow-up    cough, doing better   FU cough with isolated Low DLCO may 2021 but clear HRCT in July 2021  HPI Calvin Adams 59 y.o. -  Returns for follow-up to discuss his chronic cough.  He still has some chronic cough but overall he is better.   RSI cough score is ony 2. He did ILD questinnaire due to his exposures so we,. HE really is interested in expectant course  [facility] Integrated Comprehensive ILD Questionnaire  Symptoms: * no dysnea, Cough RSI score 2. Cough x 2 years. Better since starte. Mild, Early morning grey sputum, Cough wors eith lawn work  Past Medical History :      has a past medical history of Allergy, Arthritis, Hyperlipidemia, and Umbilical hernia.    ROS:  Cough  +,. Early morning arthralgia and stiffness   FAMILY HISTORY of LUNG DISEASE:  none   EXPOSURE HISTORY:  none   HOME and HOBBY DETAILS :  [address] year old home. 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 Range 02/20/2020 14:29  D Pteronyssinus IgE Latest Ref Range: Class I kU/L 0.54 (A)  D Farinae IgE Latest Ref Range: Class I kU/L 0.45 (A)   Results for COREN, SAGAN (MRN [id number]) as of 04/02/2020 14:06  Ref. Range 02/20/2020 14:29  Eosinophils Absolute Latest Ref Range: 0 - 0 K/uL 0.2  Results for BRAELYNN, LUPTON (MRN [id number]) as of 04/02/2020 14:06  Ref. Range 6/54/6503 54:65  Cyclic Citrullin Peptide Ab Latest Units: UNITS 31 (H)   ROS - per HPI     has a past medical history of Allergy, Arthritis, Hyperlipidemia, and Umbilical hernia.   reports that he has never smoked. He has never used smokeless tobacco.  Past Surgical History:  Procedure Laterality Date   spermaticeal removal     in [place] HERNIA REPAIR  2013    No Known Allergies  Immunization History  Administered Date(s) Administered   Td 08/04/2003   Tdap 03/14/2014   Zoster Recombinat (Shingrix) 08/19/2017, 10/23/2017    Family History  Problem Relation Age of Onset   Hypertension Mother    Brain cancer Father        brain tumor, died before patient was born   Diabetes Sister    Breast cancer Maternal Aunt        several aunts   Colon cancer Neg Hx  Esophageal cancer Neg Hx    Rectal cancer Neg Hx    Stomach cancer Neg Hx    Other Neg Hx        hypogonadism     Current Outpatient Medications:    cyclobenzaprine (FLEXERIL) 10 MG  tablet, Take 1 tablet (10 mg total) by mouth 3 (three) times daily as needed for muscle spasms., Disp: 60 tablet, Rfl: 0   doxycycline (VIBRA-TABS) 100 MG tablet, Take 1 tablet (100 mg total) by mouth 2 (two) times daily., Disp: 14 tablet, Rfl: 0   fenofibrate 160 MG tablet, TAKE ONE TABLET BY MOUTH DAILY, Disp: 90 tablet, Rfl: 1   albuterol (VENTOLIN HFA) 108 (90 Base) MCG/ACT inhaler, Inhale 2 puffs into the lungs every 6 (six) hours as needed.  (Patient not taking: Reported on 02/20/2020), Disp: , Rfl:    budesonide-formoterol (SYMBICORT) 160-4.5 MCG/ACT inhaler, Inhale 2 puffs into the lungs 2 (two) times daily as needed (asthma flare). (Patient not taking: Reported on 11/16/2019), Disp: 1 Inhaler, Rfl: 3   fluticasone furoate-vilanterol (BREO ELLIPTA) 100-25 MCG/INH AEPB, Inhale 1 puff into the lungs daily. (Patient not taking: Reported on 04/02/2020), Disp: 28 each, Rfl: 0      Objective:   Vitals:   04/02/20 1401  BP: 120/60  Pulse: 68  Temp: 97.7 F (36.5 C)  TempSrc: Oral  SpO2: 98%  Weight: 168 lb 6.4 oz (76.4 kg)  Height: 5\' 8"  (1.727 m)    Estimated body mass index is 25.61 kg/m as calculated from the following:   Height as of this encounter: 5\' 8"  (1.727 m).   Weight as of this encounter: 168 lb 6.4 oz (76.4 kg).  @WEIGHTCHANGE @  [employer]   04/02/20 1401  Weight: 168 lb 6.4 oz (76.4 kg)     Physical Exam  Alert and oriented x3.  No obvious evidence of connective tissue disease.  Alert and oriented x3.  No obvious evidence of stigmata of connective tissue disease.   Clear lung fields.  Normal heart sounds abdomen soft.        Assessment:       ICD-10-CM   1. Chronic cough  R05   2. Long-term exposure involving bird droppings  Z77.29   3. Arthralgia, unspecified joint  M25.50   4. Cyclic citrullinated peptide (CCP) antibody positive  R79.89        Plan:     Patient Instructions  Chronic cough Long-term exposure involving bird droppings  -At  this point in time no explanation for May 2021 isolated reduction in diffusion capacity on pulmonary function test -CT scan of the chest without any pulmonary fibrosis or interstitial lung disease or inflammation -Suspect lot of the cough is related to cough neuropathy or irritable larynx syndrome  Plan  -We discussed various options we took a shared decision making that we will follow this expectantly  Arthralgia, unspecified joint Cyclic citrullinated peptide (CCP) antibody positive  -There is a possibility that your early morning joint stiffness and joint pains are related to rheumatoid arthritis based on trace positive CCP antibody  Plan -Discussed with your primary care physician about referral to rheumatology  Follow-up -Recommend 1 year follow-up with repeat pulmonary function test but respect your decision to follow this expectantly -you can return as needed.   (Level 04: Estb 30-39 min  visit type: on-site physical face to visit visit spent in total care time and counseling or/and coordination of care by this undersigned MD - Calvin Brand Males. This includes one  or more of the following on this same day 04/02/2020: pre-charting, chart review, note writing, documentation discussion of test results, diagnostic or treatment recommendations, prognosis, risks and benefits of management options, instructions, education, compliance or risk-factor reduction. It excludes time spent by the [place] or office staff in the care of the patient . Actual time is 33 min)   SIGNATURE    Calvin. Brand Males, M.D., F.C.C.P,  Pulmonary and Critical Care Medicine Staff Physician, [place] Director - Interstitial Lung Disease  Program  Pulmonary [place] at [place], [place], [address]  Pager: [phone], If no answer or between  15:00h - 7:00h: call [phone] Telephone: [phone]  2:27 PM 04/02/2020

## 2020-04-09 ENCOUNTER — Ambulatory Visit: Payer: 59 | Admitting: Podiatry

## 2020-04-16 ENCOUNTER — Ambulatory Visit: Payer: 59 | Admitting: Podiatry

## 2020-05-01 ENCOUNTER — Telehealth: Payer: Self-pay

## 2020-05-01 NOTE — Telephone Encounter (Signed)
Patient is calling in asking for a visit, as he has had an on going cough for awhile and it goes on/off but it has come back worse -offered a virtual with Maudie Mercury, but declined saying he would rather talk to [place]. Can we schedule patient in a same day.

## 2020-05-02 NOTE — Telephone Encounter (Signed)
Yes, SDA is fine as long as its virtual.

## 2020-05-02 NOTE — Telephone Encounter (Signed)
Patient is scheduled for Tuesday.  

## 2020-05-07 ENCOUNTER — Telehealth: Payer: 59 | Admitting: Family Medicine

## 2020-05-21 NOTE — Progress Notes (Signed)
Phone: [phone]   Subjective:  Patient presents today for their annual physical. Chief complaint-noted.   See problem oriented charting- ROS- full  review of systems was completed and negative  except for: cough, joint pain  The following were reviewed and entered/updated in epic: Past Medical History:  Diagnosis Date  . Allergy   . Arthritis   . Hyperlipidemia   . Umbilical hernia    Patient Active Problem List   Diagnosis Date Noted  . Testicular hypofunction 07/29/2009    Priority: Medium  . Osteoarthritis of multiple joints 04/16/2008    Priority: Medium  . Hypertriglyceridemia 05/30/2007    Priority: Medium  . Asthma 05/30/2007    Priority: Medium  . Erythema nodosum 05/30/2007    Priority: Medium  . Post-inflammatory hyperpigmentation 04/23/2016    Priority: Low  . Osteoarthritis 06/04/2015    Priority: Low  . 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 Hx of umbilical hernia repair 27/25/3664    Priority: Low  . Tendinopathy of right rotator cuff 12/16/2009    Priority: Low  . BACK PAIN 11/29/2009    Priority: Low  . ERECTILE DYSFUNCTION, ORGANIC 04/24/2009    Priority: Low  . HERPES, GENITAL NOS 05/30/2007    Priority: Low  . ALLERGIC RHINITIS 05/30/2007    Priority: Low  . Cough 12/28/2019  . Umbilical pain 40/34/7425   Past Surgical History:  Procedure Laterality Date  . spermaticeal removal     in [place], [place]  . UMBILICAL HERNIA REPAIR  2013    Family History  Problem Relation Age of Onset  . Hypertension Mother   . Brain cancer Father        brain tumor, died before patient was born  . Diabetes Sister   . Breast cancer Maternal Aunt        several aunts  . Colon cancer Neg Hx   . Esophageal cancer Neg Hx   . Rectal cancer Neg Hx   . Stomach cancer Neg Hx   . Other Neg Hx        hypogonadism    Medications- reviewed and updated Current Outpatient Medications  Medication Sig Dispense Refill  . fenofibrate  160 MG tablet Take 1 tablet (160 mg total) by mouth daily. 90 tablet 3  . cyclobenzaprine (FLEXERIL) 10 MG tablet Take 1 tablet (10 mg total) by mouth 3 (three) times daily as needed for muscle spasms. (Patient not taking: Reported on 05/23/2020) 60 tablet 0   No current facility-administered medications for this visit.    Allergies-reviewed and updated No Known Allergies  Social History   Social History Narrative   From [place], [place]. Moved [place] in 2002.    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    Lives alone. Single. 2 brothers and a sister    Hobbies: working in yard, gardening, working out   Objective  Objective:  BP 108/70   Pulse 69   Temp 98 F (36.7 C) (Temporal)   Resp 18   Ht 5\' 8"  (1.727 m)   Wt 170 lb 12.8 oz (77.5 kg)   SpO2 97%   BMI 25.97 kg/m  Gen: NAD, resting comfortably HEENT: Mucous membranes are moist. Oropharynx normal. TM normal Neck: no thyromegaly CV: RRR no murmurs rubs or gallops Lungs: CTAB no crackles, wheeze, rhonchi Abdomen: soft/nontender/nondistended/normal bowel sounds. No rebound or guarding.  Ext: no edema Skin: warm, dry Neuro: grossly normal, moves all extremities, PERRLA   Assessment and Plan  59 y.o.  male presenting for annual physical.  Health Maintenance counseling: 1. Anticipatory guidance: Patient counseled regarding regular dental exams q6 months, eye exams yearly,  avoiding smoking and second hand smoke, limiting alcohol to 2 beverages per day- under that by far.   2. Risk factor reduction:  Advised patient of need for regular exercise and diet rich and fruits and vegetables to reduce risk of heart attack and stroke. Exercise- has not been to the gym since covid hit. Encouraged restart at the gym.  Diet-feels has lost muscle mass-  Weight down 1 pound from last physical in 2019. Discussed improving diet- feels does too much junk food- on the road alot Wt Readings from Last 3 Encounters:  05/23/20 170 lb 12.8 oz (77.5 kg)    04/02/20 168 lb 6.4 oz (76.4 kg)  02/20/20 169 lb (76.7 kg)  3. Immunizations/screenings/ancillary studies-declines flu shot. Recommended covid 19 booster with age and BMI over 25 Immunization History  Administered Date(s) Administered  . PFIZER SARS-COV-2 Vaccination 10/13/2019, 11/09/2019  . Td 08/04/2003  . Tdap 03/14/2014  . Zoster Recombinat (Shingrix) 08/19/2017, 10/23/2017  4. Prostate cancer screening- prior PSA trend has been low risk-trend with labs today.  Rectal exam low risk last visit and we opted to defer Lab Results  Component Value Date   PSA 1.94 03/08/2018   PSA 2.12 02/08/2017   PSA 1.94 01/31/2016   5. Colon cancer screening - February 01, 2014 with 10-year repeat 6. Skin cancer screening-has seen [facility] dermatology in the past-has appointment November 1st. advised regular sunscreen use. Denies worrisome, changing, or new skin lesions.  7. Never smoker 8. STD screening - denies unprotected sex/declines  Status of chronic or acute concerns   #hyper triglyceridemia S: Medication: Fenofibrate 160mg  Lab Results  Component Value Date   CHOL 178 03/08/2018   HDL 42.40 03/08/2018   LDLCALC 91 02/08/2017   LDLDIRECT 112.0 03/08/2018   TRIG 206.0 (H) 07/12/2018   CHOLHDL 4 03/08/2018   A/P: Reasonable control last check but would prefer under 200-update with labs    #History of low testosterone-had prior issues with shortness of breath on testosterone but he reflects back and wonders if this may have been allergies.  Last testosterone levels were low but not between 8:52 AM fasting and we encouraged repeat testing-repeat testing fasting was normal- repeat today. Low libido reported  # Chronic cough- has seen DR. Ramaswamy and had high resolution CT. Prior long term exposure to bird droppings. Worse in AM- if clears morning congestin good for rest of day. Thought was this could be cough neuropathy or irritable larynx syndrome. No further workup at this time.  - allergies  would recommend flonase for 2 weeks and then do add on therapy with claritin. Also want you to try nasal sinus rinse before bed with neti pot or neil med sinus rinse. Could be an allergy element -uses Nn95 with his birds. Dust from food bothers him even if wears his mask  # CCP antibody positive trace/ongoing arthralgias. Gets early morning stiffness and improves after 20-30 minutes. Ongoing isuses with work that makes things very challenging. Worst is in his feet- tylenol or aleve are helpful. Diffuse arthralgias- finger joints and reports swelling at times in joints. Reports seeing rheumatology years ago- extensive blood work and nothing really came of it plus was very expnesive- wants to hold off on repeat referral for now but has concern about insurance  Recommended follow up: Return in about 1 year (around 05/23/2021) for physical or  sooner if needed.  Lab/Order associations:Non fasting   ICD-10-CM   1. Preventative health care  O25.18 COMPLETE METABOLIC PANEL WITH GFR    Lipid panel    CBC with Differential/Platelet    PSA    Testosterone Total,Free,Bio, Males-([facility])  2. Mild intermittent asthma without complication  F84.21   3. Polyarticular osteoarthritis  M15.9   4. Hypertriglyceridemia  I31.2 COMPLETE METABOLIC PANEL WITH GFR    Lipid panel    CBC with Differential/Platelet  5. Screening for prostate cancer  Z12.5 PSA  6. Testicular hypofunction  E29.1 Testosterone Total,Free,Bio, Males-([facility])    Meds ordered this encounter  Medications  . fenofibrate 160 MG tablet    Sig: Take 1 tablet (160 mg total) by mouth daily.    Dispense:  90 tablet    Refill:  3   Return precautions advised.  Garret Reddish, MD

## 2020-05-21 NOTE — Patient Instructions (Addendum)
Health Maintenance Due  Topic Date Due  . INFLUENZA VACCINE Declined in office flu shot today Never done   No changes today other than getting back to the gym  Schedule a lab visit at the check out desk within 2 weeks. Return for future fasting labs meaning nothing but water after midnight please. Ok to take your medications with water. Has to be between 8-9 AM  [email or web address] to search for covid booster  - allergies would recommend flonase for 2 weeks and then do add on therapy with claritin. Also want you to try nasal sinus rinse before bed with neti pot or neil med sinus rinse. Could be an allergy element - could try not having the birds :( but doesn't sound like that would most likely help

## 2020-05-23 ENCOUNTER — Encounter: Payer: Self-pay | Admitting: Family Medicine

## 2020-05-23 ENCOUNTER — Ambulatory Visit (INDEPENDENT_AMBULATORY_CARE_PROVIDER_SITE_OTHER): Payer: 59 | Admitting: Family Medicine

## 2020-05-23 ENCOUNTER — Other Ambulatory Visit: Payer: Self-pay

## 2020-05-23 VITALS — BP 108/70 | HR 69 | Temp 98.0°F | Resp 18 | Ht 68.0 in | Wt 170.8 lb

## 2020-05-23 DIAGNOSIS — Z0000 Encounter for general adult medical examination without abnormal findings: Secondary | ICD-10-CM | POA: Diagnosis not present

## 2020-05-23 DIAGNOSIS — Z125 Encounter for screening for malignant neoplasm of prostate: Secondary | ICD-10-CM

## 2020-05-23 DIAGNOSIS — E781 Pure hyperglyceridemia: Secondary | ICD-10-CM | POA: Diagnosis not present

## 2020-05-23 DIAGNOSIS — J4520 Mild intermittent asthma, uncomplicated: Secondary | ICD-10-CM | POA: Diagnosis not present

## 2020-05-23 DIAGNOSIS — E291 Testicular hypofunction: Secondary | ICD-10-CM

## 2020-05-23 DIAGNOSIS — M159 Polyosteoarthritis, unspecified: Secondary | ICD-10-CM

## 2020-05-23 MED ORDER — FENOFIBRATE 160 MG PO TABS: 160.0000 mg | ORAL_TABLET | Freq: Every day | ORAL | 3 refills | Status: DC

## 2020-05-23 NOTE — Assessment & Plan Note (Signed)
#   CCP antibody positive trace. Gets early morning stiffness and improves after 20-30 minutes. Worst is in his feet- tylenol or aleve are helpful. Diffuse arthralgias- finger joints and reports swelling at times in joints. Reports seeing rheumatology years ago- extensive blood work and nothing really came of it plus was very expnesive- wants to hold off on repeat referral

## 2020-05-24 ENCOUNTER — Other Ambulatory Visit: Payer: 59

## 2020-05-24 DIAGNOSIS — E781 Pure hyperglyceridemia: Secondary | ICD-10-CM

## 2020-05-24 DIAGNOSIS — E291 Testicular hypofunction: Secondary | ICD-10-CM

## 2020-05-24 DIAGNOSIS — Z125 Encounter for screening for malignant neoplasm of prostate: Secondary | ICD-10-CM

## 2020-05-24 DIAGNOSIS — Z0000 Encounter for general adult medical examination without abnormal findings: Secondary | ICD-10-CM

## 2020-05-27 LAB — TESTOSTERONE TOTAL,FREE,BIO, MALES
Albumin: 4.4 g/dL (ref 3.6–5.1)
Sex Hormone Binding: 37 nmol/L (ref 22–77)
Testosterone, Bioavailable: 89.1 ng/dL — ABNORMAL LOW (ref >=110.0)
Testosterone, Free: 44.3 pg/mL — ABNORMAL LOW (ref 46.0–224.0)
Testosterone: 373 ng/dL (ref 250–827)

## 2020-05-27 LAB — COMPLETE METABOLIC PANEL WITH GFR
AG Ratio: 2.6 (calc) — ABNORMAL HIGH (ref 1.0–2.5)
ALT: 23 U/L (ref 9–46)
AST: 17 U/L (ref 10–35)
Albumin: 4.4 g/dL (ref 3.6–5.1)
Alkaline phosphatase (APISO): 48 U/L (ref 35–144)
BUN: 15 mg/dL (ref 7–25)
CO2: 27 mmol/L (ref 20–32)
Calcium: 9.2 mg/dL (ref 8.6–10.3)
Chloride: 106 mmol/L (ref 98–110)
Creat: 1.18 mg/dL (ref 0.70–1.33)
GFR, Est African American: 79 mL/min/{1.73_m2} (ref >=60)
GFR, Est Non African American: 68 mL/min/{1.73_m2} (ref >=60)
Globulin: 1.7 g/dL (calc) — ABNORMAL LOW (ref 1.9–3.7)
Glucose, Bld: 83 mg/dL (ref 65–99)
Potassium: 4.5 mmol/L (ref 3.5–5.3)
Sodium: 141 mmol/L (ref 135–146)
Total Bilirubin: 0.5 mg/dL (ref 0.2–1.2)
Total Protein: 6.1 g/dL (ref 6.1–8.1)

## 2020-05-27 LAB — LIPID PANEL
Cholesterol: 190 mg/dL (ref <200)
HDL: 47 mg/dL (ref >=40)
LDL Cholesterol (Calc): 116 mg/dL (calc) — ABNORMAL HIGH
Non-HDL Cholesterol (Calc): 143 mg/dL (calc) — ABNORMAL HIGH (ref <130)
Total CHOL/HDL Ratio: 4 (calc) (ref <5.0)
Triglycerides: 153 mg/dL — ABNORMAL HIGH (ref <150)

## 2020-05-27 LAB — CBC WITH DIFFERENTIAL/PLATELET
Absolute Monocytes: 476 cells/uL (ref 200–950)
Basophils Absolute: 40 cells/uL (ref 0–200)
Basophils Relative: 1 %
Eosinophils Absolute: 128 cells/uL (ref 15–500)
Eosinophils Relative: 3.2 %
HCT: 43.3 % (ref 38.5–50.0)
Hemoglobin: 14.4 g/dL (ref 13.2–17.1)
Lymphs Abs: 824 cells/uL — ABNORMAL LOW (ref 850–3900)
MCH: 30.7 pg (ref 27.0–33.0)
MCHC: 33.3 g/dL (ref 32.0–36.0)
MCV: 92.3 fL (ref 80.0–100.0)
MPV: 11.8 fL (ref 7.5–12.5)
Monocytes Relative: 11.9 %
Neutro Abs: 2532 cells/uL (ref 1500–7800)
Neutrophils Relative %: 63.3 %
Platelets: 220 10*3/uL (ref 140–400)
RBC: 4.69 10*6/uL (ref 4.20–5.80)
RDW: 12.2 % (ref 11.0–15.0)
Total Lymphocyte: 20.6 %
WBC: 4 10*3/uL (ref 3.8–10.8)

## 2020-05-27 LAB — PSA: PSA: 1.69 ng/mL (ref <=4.0)

## 2020-05-28 ENCOUNTER — Other Ambulatory Visit: Payer: Self-pay

## 2020-05-28 DIAGNOSIS — R7989 Other specified abnormal findings of blood chemistry: Secondary | ICD-10-CM

## 2020-05-29 ENCOUNTER — Other Ambulatory Visit: Payer: Self-pay

## 2020-05-29 ENCOUNTER — Other Ambulatory Visit: Payer: 59

## 2020-05-29 DIAGNOSIS — R7989 Other specified abnormal findings of blood chemistry: Secondary | ICD-10-CM

## 2020-05-31 LAB — TESTOSTERONE TOTAL,FREE,BIO, MALES
Albumin: 4.7 g/dL (ref 3.6–5.1)
Sex Hormone Binding: 30 nmol/L (ref 22–77)
Testosterone, Bioavailable: 128.7 ng/dL (ref >=110.0)
Testosterone, Free: 60.1 pg/mL (ref 46.0–224.0)
Testosterone: 427 ng/dL (ref 250–827)

## 2020-07-10 ENCOUNTER — Telehealth: Payer: Self-pay

## 2020-07-10 NOTE — Telephone Encounter (Signed)
Noted! Thank you

## 2020-07-10 NOTE — Telephone Encounter (Signed)
Patient dropped off [facility] paper work to be filled out, has been placed in providers box.

## 2020-07-17 NOTE — Telephone Encounter (Signed)
Patient has been notified that paperwork is ready for pick up  

## 2020-07-17 NOTE — Telephone Encounter (Signed)
Pt is following up on this paper. I did not find it in the folders up front or in Dr. Ronney Lion ' sign' bin. Please advise.

## 2020-07-18 NOTE — Telephone Encounter (Signed)
Patient has picked up paperwork.

## 2020-11-04 ENCOUNTER — Ambulatory Visit: Payer: Self-pay | Admitting: Surgery

## 2020-11-04 NOTE — H&P (Signed)
Dustin Flock Appointment: 11/04/2020 1:45 PM Location: [facility] Patient #: [id number] DOB: Mar 29, 1961 Single / Language: Cleophus Molt / Race: White Male  History of Present Illness Adin Hector MD; 11/04/2020 5:14 PM) The patient is a 60 year old male who presents with a complaint of anal problems. Note for "Anal problems": ` ` ` Patient sent for surgical consultation at the request of Dr Pearline Cables  Chief Complaint: Anal itching and pain. Possible fissure. ` ` The patient is a 60 year old male. He struggled with moister and irritation for quite some time. As discussed with dermatology. They thought perhaps was a rash and recommended steroid paste creams. He had a few rounds of that. He had some anal pain and discomfort and irritation as well. Reexamination reach the concern of possible anal fissure. Surgical consultation offered. Patient usually moves his bowels a few times in the morning. He is tender to have harder stools. She switch to stool softeners since helped somewhat. Sharp pain is gone down but he has a lot of chronic moister and itching and irritation. Quite uncomfortable for him. He had a colonoscopy by Dr. Henrene Pastor year or 2 ago that was rather underwhelming. No polyps or diverticulosis. Ten-year follow-up recommended. Denies any irritable bowel or inflammatory bowel disease. He does not smoke. No blood thinners. No diabetes. No sleep apnea. An open umbilical hernia repair by Dr. Excell Seltzer and 2018. He had a more urgent appendectomy in [place] last year.  No personal nor family history of GI/colon cancer, inflammatory bowel disease, irritable bowel syndrome, allergy such as Celiac Sprue, dietary/dairy problems, colitis, ulcers nor gastritis. No recent sick contacts/gastroenteritis. No travel outside the country. No changes in diet. No dysphagia to solids or liquids. No significant heartburn or reflux. No melena, hematemesis, coffee ground emesis. No  evidence of prior gastric/peptic ulceration.  (Review of systems as stated in this history (HPI) or in the review of systems. Otherwise all other 12 point ROS are negative) ` ` ###########################################`  This patient encounter took 25 minutes today to perform the following: obtain history, perform exam, review outside records, interpret tests & imaging, counsel the patient on their diagnosis; and, document this encounter, including findings & plan in the electronic health record (EHR).   Past Surgical History Janeann Forehand, CNA; 11/04/2020 1:41 PM) Ventral / Umbilical Hernia Surgery Bilateral, Left.  Diagnostic Studies History Janeann Forehand, CNA; 11/04/2020 1:41 PM) Colonoscopy 5-10 years ago 1-5 years ago  Allergies Janeann Forehand, CNA; 11/04/2020 1:41 PM) No Known Drug Allergies [06/04/2017]: Allergies Reconciled  Medication History Janeann Forehand, CNA; 11/04/2020 1:42 PM) Fenofibrate (160MG  Tablet, Oral) Active. Ibuprofen (800MG  Tablet, Oral) Active. Fenofibrate Micronized (130MG  Capsule, Oral) Active. Multiple Vitamin (Oral) Active. Medications Reconciled  Social History Janeann Forehand, CNA; 11/04/2020 1:41 PM) Caffeine use Carbonated beverages. No alcohol use No drug use Tobacco use Never smoker.  Family History Janeann Forehand, CNA; 11/04/2020 1:41 PM) Arthritis Mother, Sister. Hypertension Brother, Mother, Sister.  Other Problems Janeann Forehand, CNA; 11/04/2020 1:41 PM) Arthritis Umbilical Hernia Repair     Review of Systems Janeann Forehand CNA; 11/04/2020 1:41 PM) General Present- Night Sweats. Not Present- Appetite Loss, Chills, Fatigue, Fever, Weight Gain and Weight Loss. Cardiovascular Not Present- Chest Pain, Difficulty Breathing Lying Down, Leg Cramps, Palpitations, Rapid Heart Rate, Shortness of Breath and Swelling of Extremities. Psychiatric Not Present- Anxiety, Bipolar, Change in Sleep Pattern, Depression, Fearful  and Frequent crying. Endocrine Not Present- Cold Intolerance, Excessive Hunger, Hair Changes, Heat Intolerance, Hot flashes and New Diabetes. Hematology Not  Present- Blood Thinners, Easy Bruising, Excessive bleeding, Gland problems, HIV and Persistent Infections.  Vitals (Donyelle Alston CNA; 11/04/2020 1:42 PM) 11/04/2020 1:42 PM Weight: 167.5 lb Height: 69in Body Surface Area: 1.92 m Body Mass Index: 24.74 kg/m  Temp.: 97.41F  Pulse: 81 (Regular)  P.OX: 98% (Room air) BP: 120/80(Sitting, Left Arm, Standard)        Physical Exam Adin Hector MD; 11/04/2020 5:15 PM)  General Mental Status-Alert. General Appearance-Not in acute distress, Not Sickly. Orientation-Oriented X3. Hydration-Well hydrated. Voice-Normal.  Integumentary Global Assessment Upon inspection and palpation of skin surfaces of the - Axillae: non-tender, no inflammation or ulceration, no drainage. and Distribution of scalp and body hair is normal. General Characteristics Temperature - normal warmth is noted.  Head and Neck Head-normocephalic, atraumatic with no lesions or palpable masses. Face Global Assessment - atraumatic, no absence of expression. Neck Global Assessment - no abnormal movements, no bruit auscultated on the right, no bruit auscultated on the left, no decreased range of motion, non-tender. Trachea-midline. Thyroid Gland Characteristics - non-tender.  Eye Eyeball - Left-Extraocular movements intact, No Nystagmus - Left. Eyeball - Right-Extraocular movements intact, No Nystagmus - Right. Cornea - Left-No Hazy - Left. Cornea - Right-No Hazy - Right. Sclera/Conjunctiva - Left-No scleral icterus, No Discharge - Left. Sclera/Conjunctiva - Right-No scleral icterus, No Discharge - Right. Pupil - Left-Direct reaction to light normal. Pupil - Right-Direct reaction to light normal.  ENMT Ears Pinna - Left - no drainage observed, no generalized  tenderness observed. Pinna - Right - no drainage observed, no generalized tenderness observed. Nose and Sinuses External Inspection of the Nose - no destructive lesion observed. Inspection of the nares - Left - quiet respiration. Inspection of the nares - Right - quiet respiration. Mouth and Throat Lips - Upper Lip - no fissures observed, no pallor noted. Lower Lip - no fissures observed, no pallor noted. Nasopharynx - no discharge present. Oral Cavity/Oropharynx - Tongue - no dryness observed. Oral Mucosa - no cyanosis observed. Hypopharynx - no evidence of airway distress observed.  Chest and Lung Exam Inspection Movements - Normal and Symmetrical. Accessory muscles - No use of accessory muscles in breathing. Palpation Palpation of the chest reveals - Non-tender. Auscultation Breath sounds - Normal and Clear.  Cardiovascular Auscultation Rhythm - Regular. Murmurs & Other Heart Sounds - Auscultation of the heart reveals - No Murmurs and No Systolic Clicks.  Abdomen Inspection Inspection of the abdomen reveals - No Visible peristalsis and No Abnormal pulsations. Umbilicus - No Bleeding, No Urine drainage. Palpation/Percussion Palpation and Percussion of the abdomen reveal - Soft, Non Tender, No Rebound tenderness, No Rigidity (guarding) and No Cutaneous hyperesthesia. Note: Abdomen soft. Nontender. Not distended. No umbilical or incisional hernias. No guarding.  Male Genitourinary Sexual Maturity Tanner 5 - Adult hair pattern and Adult penile size and shape.  Rectal Note: Please refer to anoscopy.  Moderate pruritus with a few external tags. Inflamed internal hemorrhoids at least grade 2, probably grade 3.  Peripheral Vascular Upper Extremity Inspection - Left - No Cyanotic nailbeds - Left, Not Ischemic. Inspection - Right - No Cyanotic nailbeds - Right, Not Ischemic.  Neurologic Neurologic evaluation reveals -normal attention span and ability to concentrate, able to  name objects and repeat phrases. Appropriate fund of knowledge , normal sensation and normal coordination. Mental Status Affect - not angry, not paranoid. Cranial Nerves-Normal Bilaterally. Gait-Normal.  Neuropsychiatric Mental status exam performed with findings of-able to articulate well with normal speech/language, rate, volume and coherence, thought content  normal with ability to perform basic computations and apply abstract reasoning and no evidence of hallucinations, delusions, obsessions or homicidal/suicidal ideation.  Musculoskeletal Global Assessment Spine, Ribs and Pelvis - no instability, subluxation or laxity. Right Upper Extremity - no instability, subluxation or laxity.  Lymphatic Head & Neck  General Head & Neck Lymphatics: Bilateral - Description - No Localized lymphadenopathy. Axillary  General Axillary Region: Bilateral - Description - No Localized lymphadenopathy. Femoral & Inguinal  Generalized Femoral & Inguinal Lymphatics: Left - Description - No Localized lymphadenopathy. Right - Description - No Localized lymphadenopathy.   Results Adin Hector MD; 11/04/2020 5:14 PM) Procedures  Name Value Date Hemorrhoids Procedure Anal exam: External Hemorrhoid Internal exam: Internal Hemorrhoids (bleeding) Internal Hemorroids ( non-bleeding) prolapse Other: ...........Marland KitchenPerianal skin clean with chronic moisture but decent hygiene. Mild pruritis ani. No pilonidal disease. No fissure. No abscess/fistula. Normal sphincter tone. ....Marland KitchenMarland KitchenRight anterior midline external hemorrhoid tag. Posterior midline tag, suspicious for a sentinel tag from a prior fissure BUT no active fissure. No condyloma warts............Marland KitchenTolerates digital and anoscopic rectal exam. Enlarged hemorrhoids. Right posterior at least grade 2 and friable. Suspect grade 3 as it does somewhat prolapse with straining. Right anterior and left lateral grade 2. Irritated  and thickened. No fissure felt. Prostate smooth and not particularly enlarged. No rectal masses.  Performed: 11/04/2020 1:55 PM    Assessment & Plan Adin Hector MD; 11/04/2020 5:11 PM)  PROLAPSED INTERNAL HEMORRHOIDS, GRADE 3 (K64.2) Impression: Story of moderate pruritus and itching and irritation with inflamed internal hemorrhoids most likely the source.  I recommended examination under anesthesia with hemorrhoidal ligation pexing probable hemorrhoidectomies. Should be an outpatient surgery. He had a lot of questions about the process and recovery. He helps take care of his mother who lives in [place] so we usually drives several times a week. I cautioned he will not be independent right away & will need time to recover. It would be wise for him to have other people help take care of his mother for the first few weeks  In the end I think he is leaning towards hemorrhoid surgery but is not ready for plans just yet. Gave him information. We will tentatively post things is therefore proceeding with surgery since I think he is ultimately going to do it.  The anatomy & physiology of the anorectal region was discussed. The pathophysiology of hemorrhoids and differential diagnosis was discussed. Natural history progression was discussed. I stressed the importance of a bowel regimen to have daily soft bowel movements to minimize progression of disease. Goal of one BM / day ideal. Use of wet wipes, warm baths, avoiding straining, etc were emphasized.  Educational handouts further explaining the pathology, treatment options, and bowel regimen were given as well. The patient expressed understanding.  Current Plans ANOSCOPY, DIAGNOSTIC (47425) Pt Education - [facility] Hemorrhoids (Yoselin Amerman): discussed with patient and provided information. Pt Education - Pamphlet Given - The Hemorrhoid Book: discussed with patient and provided information. The anatomy & physiology of the anorectal region was discussed. The  pathophysiology of hemorrhoids and differential diagnosis was discussed. Natural history risks without surgery was discussed. I stressed the importance of a bowel regimen to have daily soft bowel movements to minimize progression of disease. Interventions such as sclerotherapy & banding were discussed.  The patient's symptoms are not adequately controlled by medicines and other non-operative treatments. I feel the risks & problems of no surgery outweigh the operative risks; therefore, I recommended surgery to treat the hemorrhoids by ligation, pexy, and  possible resection.  Risks such as bleeding, infection, urinary difficulties, need for further treatment, heart attack, death, and other risks were discussed. I noted a good likelihood this will help address the problem. Goals of post-operative recovery were discussed as well. Possibility that this will not correct all symptoms was explained. Post-operative pain, bleeding, constipation, and other problems after surgery were discussed. We will work to minimize complications. Educational handouts further explaining the pathology, treatment options, and bowel regimen were given as well. Questions were answered. The patient expresses understanding & wishes to proceed with surgery.   PRURITUS ANI (L29.0) Impression: Pruritus and I most likely related to his hemorrhoid issues. Not of fissure or fistula. Hopefully as his hemorrhoid issues are controlled with surgery, it should abate as well.  Current Plans Pt Education - [facility] Pruritus Ani (AT)  EXTERNAL HEMORRHOIDS WITH COMPLICATION (P49.8) Impression: External hemorrhoid tags. Would remove at the time of hemorrhoidal ligation and hemorrhoidectomy   ENCOUNTER FOR PREOPERATIVE EXAMINATION FOR GENERAL SURGICAL PROCEDURE (Z01.818)  Current Plans You are being scheduled for surgery- Our schedulers will call you.  You should hear from our office's scheduling department within 5 working days  about the location, date, and time of surgery. We try to make accommodations for patient's preferences in scheduling surgery, but sometimes the OR schedule or the surgeon's schedule prevents [place] from making those accommodations.  If you have not heard from our office [phone]) in 5 working days, call the office and ask for your surgeon's nurse.  If you have other questions about your diagnosis, plan, or surgery, call the office and ask for your surgeon's nurse.  Pt Education - [facility] Rectal Prep for Anorectal outpatient/office surgery: discussed with patient and provided information. Pt Education - [facility] Rectal Surgery HCI (Melvern Ramone): discussed with patient and provided information.   Adin Hector, MD, FACS, MASCRS  Gastrointestinal and Minimally Invasive Surgery  [facility] [address], [place], [place] [address] [phone] Fax [phone] Main/Paging  CONTACT INFORMATION: Weekday (9AM-5PM) concerns: Call [facility] main office at [phone] Weeknight (5PM-9AM) or Weekend/Holiday concerns: Check [email or web address] for General Surgery [facility] coverage (Please, do not use SecureChat as it is not reliable communication to operating surgeons for immediate patient care)

## 2020-11-18 NOTE — Patient Instructions (Addendum)
Health Maintenance Due  Topic Date Due  . COVID-19 Vaccine (3 - Booster for [employer] series) will call back with dates.  05/10/2020   We will call you within two weeks about your referral to GI Dr. Henrene Pastor. If you do not hear within 2 weeks, give [place] a call.   Lets try hydrocortisone up to 10 days at a time- need at least a 7-10 day break in between and can use again  Continue stool softener  Recommended follow up: keep October physical

## 2020-11-18 NOTE — Progress Notes (Signed)
Phone [phone] In person visit   Subjective:   Calvin Adams is a 60 y.o. year old very pleasant male patient who presents for/with See problem oriented charting Chief Complaint  Patient presents with  . Referral    GI referral.    This visit occurred during the SARS-CoV-2 public health emergency.  Safety protocols were in place, including screening questions prior to the visit, additional usage of staff PPE, and extensive cleaning of exam room while observing appropriate contact time as indicated for disinfecting solutions.   Past Medical History-  Patient Active Problem List   Diagnosis Date Noted  . Testicular hypofunction 07/29/2009    Priority: Medium  . Osteoarthritis of multiple joints 04/16/2008    Priority: Medium  . Hypertriglyceridemia 05/30/2007    Priority: Medium  . Asthma 05/30/2007    Priority: Medium  . Erythema nodosum 05/30/2007    Priority: Medium  . Post-inflammatory hyperpigmentation 04/23/2016    Priority: Low  . Osteoarthritis 06/04/2015    Priority: Low  . 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 Hx of umbilical hernia repair 69/48/5462    Priority: Low  . Tendinopathy of right rotator cuff 12/16/2009    Priority: Low  . BACK PAIN 11/29/2009    Priority: Low  . ERECTILE DYSFUNCTION, ORGANIC 04/24/2009    Priority: Low  . HERPES, GENITAL NOS 05/30/2007    Priority: Low  . ALLERGIC RHINITIS 05/30/2007    Priority: Low  . Cough 12/28/2019  . Umbilical pain 70/35/0093    Medications- reviewed and updated Current Outpatient Medications  Medication Sig Dispense Refill  . fenofibrate 160 MG tablet Take 1 tablet (160 mg total) by mouth daily. 90 tablet 3  . hydrocortisone (ANUSOL-HC) 2.5 % rectal cream Place 1 application rectally 2 (two) times daily. Up to 10 days 90 g 1   No current facility-administered medications for this visit.     Objective:  BP 122/76   Pulse 68   Temp 98 F (36.7 C) (Temporal)   Ht 5'  9" (1.753 m)   Wt 167 lb 12.8 oz (76.1 kg)   SpO2 98%   BMI 24.78 kg/m  Gen: NAD, resting comfortably CV: RRR no murmurs rubs or gallops Lungs: CTAB no crackles, wheeze, rhonchi Ext: no edema Skin: warm, dry Rectal: hemorrhoid tag at about 10 o clock. Did not do internal exam- no visible active hemorrhoids    Assessment and Plan   # history ofRectal irritation #Grade III hemorrhoids per Dr. Pearline Cables S:colonoscopy 02/01/14 largely normal other than mild diverticulosis with Dr. Henrene Pastor- no polyps.   Patient later saw dermatology after our visit 12/01/16 where he had rectal irritation- tried a different cream from what I had prescribed that didn't help. We tried mebendazole for concern with concern of pinworms that didn't help. I tred ketoconazole which didn't help  Since that time issues have come and go. In last 6-8 months has had issues with constipation and area got irrigtated again. Would sometimes feel like a cutting sensation. He reported to baby soap which didn't burn but other soaps would burn in the shower. Saw Dr. Pearline Cables in the fall- was concerned about fissures potentially.   Was referred to Dr. Johney Maine who diagnosed grade III internal hemorrhoids and surgery was recommended. Patient asked for a 2nd opinion in their office- but was only given this opinion by those surgeons looking at his note and recommended same . Was told banding was not an option.   Recommended by  a friend of a friend that is a doctor to try horse chestnut.   He used hydrocortisone 1% otc for 7 days and tried stool softeners and has had signifiant improvement in pain. Still some itching but improving  Has tried equate brand hemorrhoid suppositories that helped some.   In a prior note I mentioned "no recent anal intercourse" but clarified with patient today and has never had anal intercourse.    A/P: Patient diagnosed by Dr. Johney Maine with Grade III internal hemorrhoids and exam under anesthesia with possible surgery was  recommended. Patient has improved since that time with conservative care. He would like to get GI opinion- last colonoscopy was 7 years ago and would like their opinion on repeat as well. Had been having rectal discomfort but that is much better. Saw Dr. Henrene Pastor previously and referred back. Can continue hydrocortisone but will give prescription strength 2.5%- can use up to 10 days but then want him to take a 7-10 day break before reusing. Continue stool softener.  - continue wet wipes  Recommended follow up:  Keep October physical  Future Appointments  Date Time Provider [place]  05/26/2021  1:20 PM Marin Olp, MD [facility]   Lab/Order associations:   ICD-10-CM   1. Grade III hemorrhoids  K64.2 Ambulatory referral to Gastroenterology    Meds ordered this encounter  Medications  . hydrocortisone (ANUSOL-HC) 2.5 % rectal cream    Sig: Place 1 application rectally 2 (two) times daily. Up to 10 days    Dispense:  90 g    Refill:  1   Time Spent: 31 minutes of total time (4:10 PM- 4:41 PM) was spent on the date of the encounter performing the following actions: chart review prior to seeing the patient, obtaining history, performing a medically necessary exam, counseling on the treatment plan, placing orders, and documenting in our EHR.   Return precautions advised.  Garret Reddish, MD

## 2020-11-19 ENCOUNTER — Encounter: Payer: Self-pay | Admitting: Family Medicine

## 2020-11-19 ENCOUNTER — Other Ambulatory Visit: Payer: Self-pay

## 2020-11-19 ENCOUNTER — Ambulatory Visit (INDEPENDENT_AMBULATORY_CARE_PROVIDER_SITE_OTHER): Payer: 59 | Admitting: Family Medicine

## 2020-11-19 VITALS — BP 122/76 | HR 68 | Temp 98.0°F | Ht 69.0 in | Wt 167.8 lb

## 2020-11-19 DIAGNOSIS — K642 Third degree hemorrhoids: Secondary | ICD-10-CM | POA: Diagnosis not present

## 2020-11-19 MED ORDER — HYDROCORTISONE (PERIANAL) 2.5 % EX CREA: 1.0000 "application " | TOPICAL_CREAM | Freq: Two times a day (BID) | CUTANEOUS | 1 refills | Status: DC

## 2021-01-04 IMAGING — CT CT CHEST HIGH RESOLUTION W/O CM
2 of 9 series · 14 of 36 positions shown, 17 images · non-contrast
Comparison: None.

CLINICAL DATA: Persistent cough, dyspnea on exertion, abnormal PFT,
nonsmoker

EXAM:
CT CHEST WITHOUT CONTRAST
TECHNIQUE: Multidetector CT imaging of the chest was performed following the
standard protocol without intravenous contrast. High resolution
imaging of the lungs, as well as inspiratory and expiratory imaging,
was performed.

[Series 5: high resolution · axial · 0.66mm/px · z∈[-340,-68]mm · 11 of 328 slices shown, 14 images]
[im 28/328  mediastinal]
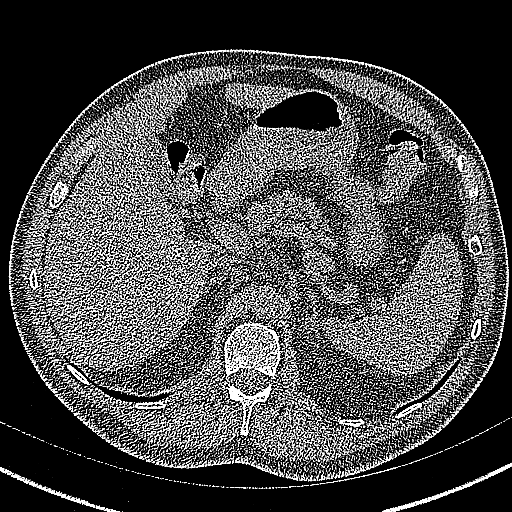
[im 28/328  lung]
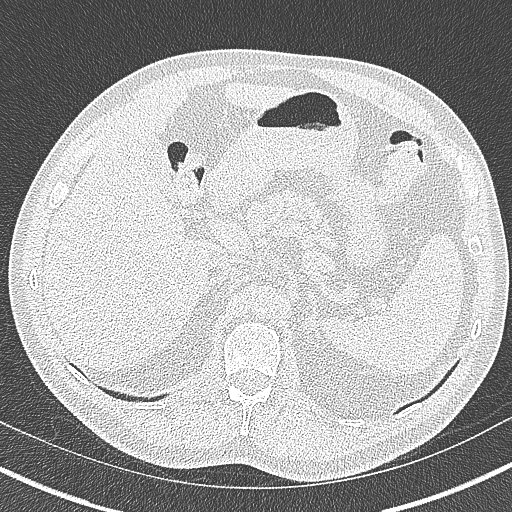
[im 55/328  lung]
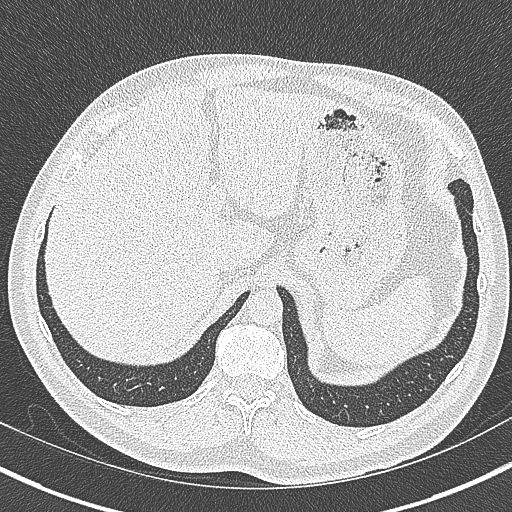
[im 82/328  lung]
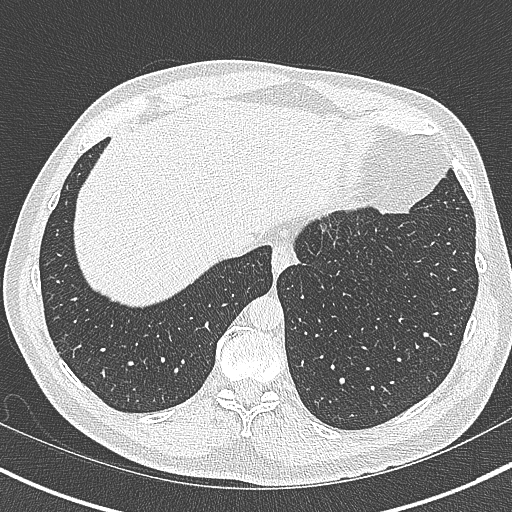
[im 110/328  lung]
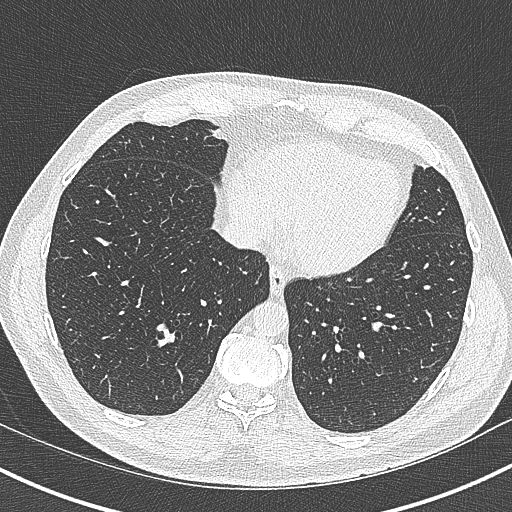
[im 137/328  mediastinal]
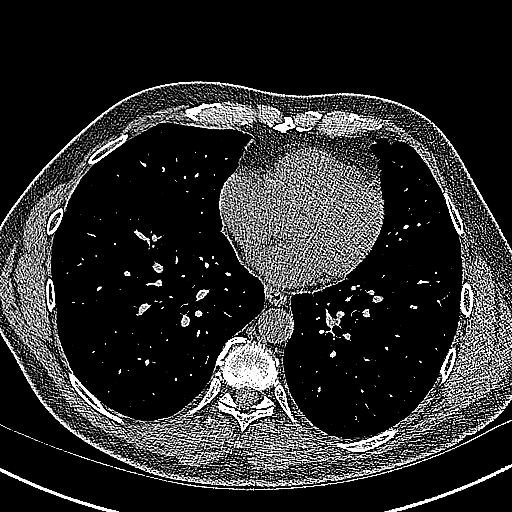
[im 137/328  lung]
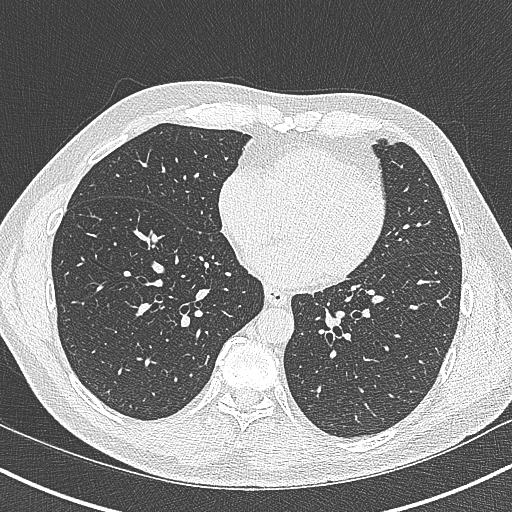
[im 164/328  lung]
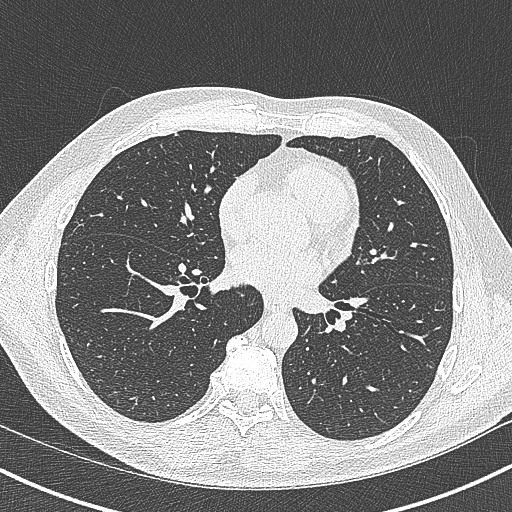
[im 191/328  lung]
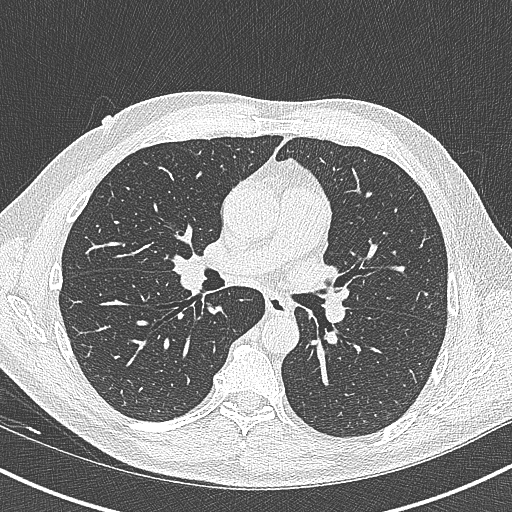
[im 219/328  lung]
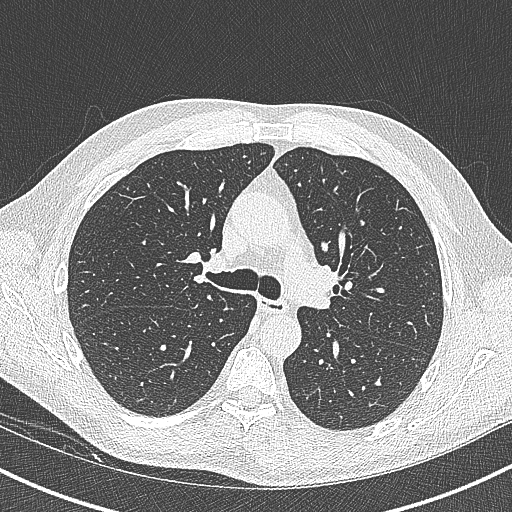
[im 246/328  mediastinal]
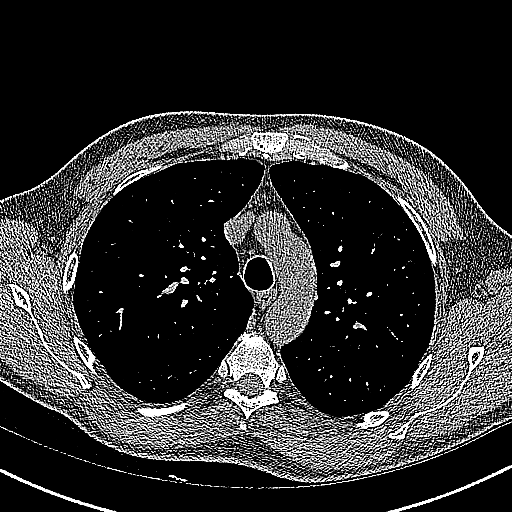
[im 246/328  lung]
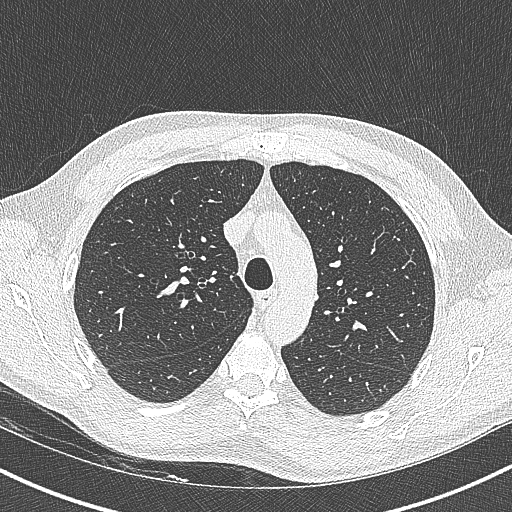
[im 273/328  lung]
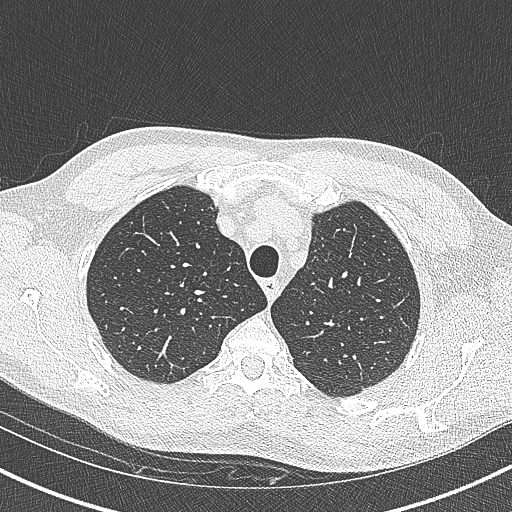
[im 300/328  lung]
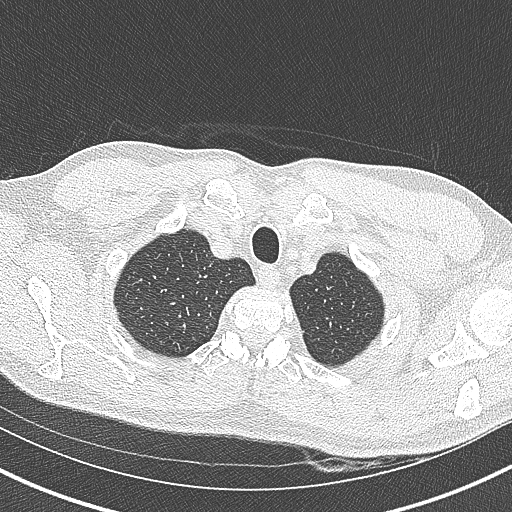

[Series 7: coronal · coronal · 0.63mm/px · 3 of 126 slices shown]
[im 26/126  lung]
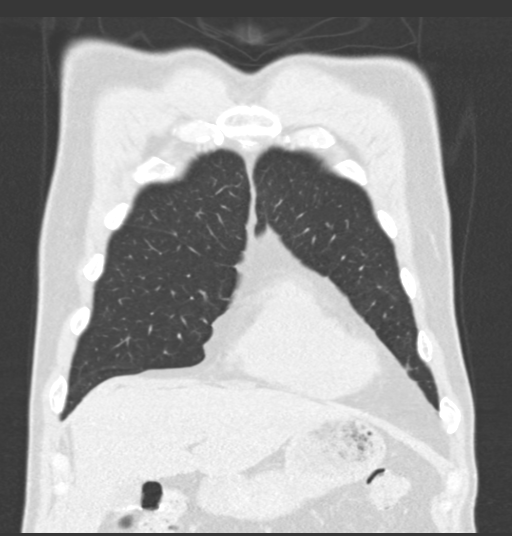
[im 51/126  lung]
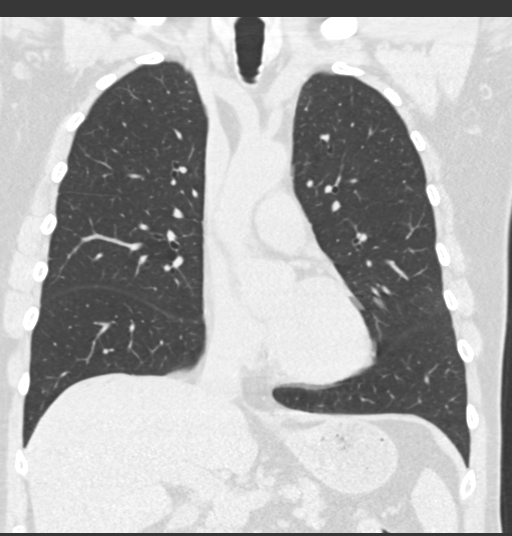
[im 76/126  lung]
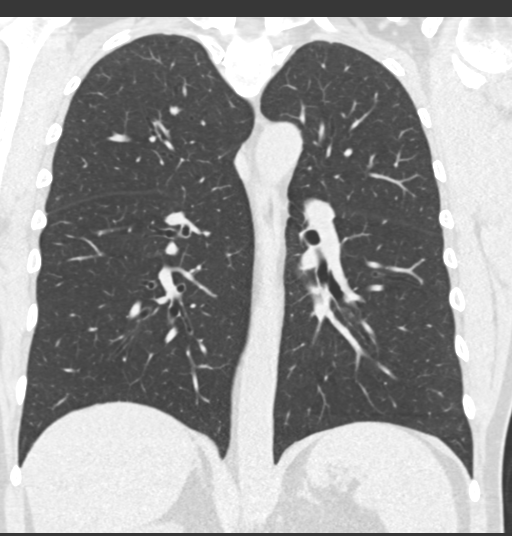

[14 of 36 positions shown; findings below may reference images not displayed]

FINDINGS: Cardiovascular: No significant vascular findings. Normal heart size.
No pericardial effusion.

Mediastinum/Nodes: No enlarged mediastinal, hilar, or axillary lymph
nodes. Thyroid gland, trachea, and esophagus demonstrate no
significant findings.

Lungs/Pleura: Lungs are clear. No evidence of fibrotic interstitial
lung disease. No pleural effusion or pneumothorax.

Upper Abdomen: No acute abnormality.

Musculoskeletal: No chest wall mass or suspicious bone lesions
identified.
IMPRESSION: No evidence of fibrotic interstitial lung disease. No significant
air trapping on expiratory phase imaging.

## 2021-02-24 ENCOUNTER — Telehealth: Payer: Self-pay | Admitting: Internal Medicine

## 2021-02-24 ENCOUNTER — Encounter: Payer: Self-pay | Admitting: Internal Medicine

## 2021-02-24 ENCOUNTER — Ambulatory Visit (INDEPENDENT_AMBULATORY_CARE_PROVIDER_SITE_OTHER): Payer: 59 | Admitting: Internal Medicine

## 2021-02-24 VITALS — BP 100/65 | HR 74 | Ht 69.0 in | Wt 169.0 lb

## 2021-02-24 DIAGNOSIS — K625 Hemorrhage of anus and rectum: Secondary | ICD-10-CM

## 2021-02-24 DIAGNOSIS — K6289 Other specified diseases of anus and rectum: Secondary | ICD-10-CM | POA: Diagnosis not present

## 2021-02-24 DIAGNOSIS — K648 Other hemorrhoids: Secondary | ICD-10-CM | POA: Diagnosis not present

## 2021-02-24 DIAGNOSIS — K602 Anal fissure, unspecified: Secondary | ICD-10-CM

## 2021-02-24 MED ORDER — GOLYTELY 236 G PO SOLR: 4000.0000 mL | Freq: Once | ORAL | 0 refills | Status: AC

## 2021-02-24 NOTE — Telephone Encounter (Signed)
Patient called to inform the doctor that he did have his appendix removed 1/21.

## 2021-02-24 NOTE — Patient Instructions (Signed)
If you are age 58 or younger, your body mass index should be between 19-25. Your Body mass index is 24.96 kg/m. If this is out of the aformentioned range listed, please consider follow up with your Primary Care Provider.  _________________________________________________________  The [facility] providers would like to encourage you to use [facility] to communicate with providers for non-urgent requests or questions.  Due to long hold times on the telephone, sending your provider a message by [facility] may be a faster and more efficient way to get a response.  Please allow 48 business hours for a response.  Please remember that this is for non-urgent requests.   You have been scheduled for a colonoscopy. Please follow written instructions given to you at your visit today.  Please pick up your prep supplies at the pharmacy within the next 1-3 days. If you use inhalers (even only as needed), please bring them with you on the day of your procedure.  Due to recent changes in healthcare laws, you may see the results of your imaging and laboratory studies on MyChart before your provider has had a chance to review them.  We understand that in some cases there may be results that are confusing or concerning to you. Not all laboratory results come back in the same time frame and the provider may be waiting for multiple results in order to interpret others.  Please give [place] 48 hours in order for your provider to thoroughly review all the results before contacting the office for clarification of your results.   Thank you for entrusting me with your care and choosing [facility].  Dr Henrene Pastor

## 2021-02-24 NOTE — Progress Notes (Signed)
HISTORY OF PRESENT ILLNESS:  Calvin Adams is a 60 y.o. male who is sent today by his primary care provider regarding rectal pain and rectal bleeding.  I saw the patient once on February 01, 2014 regarding routine screening colonoscopy. The examination revealed mild scattered diverticulosis and internal hemorrhoids.  Patient tells me that over the past year he has had problems with rectal pain associated with defecation.  As well there has been minor blood per rectum.  He tried over-the-counter agents without improvement.  He mentioned this to his dermatologist who referred him to general surgery, Dr. Johney Maine.  Patient currently underwent endoscopy and was diagnosed with internal hemorrhoids for which surgery was recommended.  He was told that he would need to be out of work for 3 months.  He declined surgery.  Thereafter, he was prescribed hydrocortisone cream.  This helped tremendously.  On occasion when he has had discomfort with defecation he has self inspected and noticed what sounds like a fissure.  Generally has 2 bowel movements per day.  The first bowel movement might be hard.  He has been taking stool softeners, which helped.  He inquires about follow-up colonoscopy as it has been 7 years.  He wonders about definitive therapies for his perirectal issues and bleeding.  He did see his PCP who performed external perianal inspection and noticed a hemorrhoidal tag.  Nothing further.  GI opinion sought  REVIEW OF SYSTEMS:  All non-GI ROS negative unless otherwise stated in the HPI except for night sweats  Past Medical History:  Diagnosis Date   Allergy    Arthritis    Hemorrhoids    Hyperlipidemia    Umbilical hernia     Past Surgical History:  Procedure Laterality Date   COLONOSCOPY     spermaticeal removal     in [place], [place]  08/04/2011    Social History DAMONN Adams  reports that he has never smoked. He has never used smokeless tobacco. He reports current  alcohol use. He reports that he does not use drugs.  family history includes Brain cancer in his father; Breast cancer in his maternal aunt; Colon polyps in his sister; Diabetes in his sister; Hypertension in his mother.  No Known Allergies     PHYSICAL EXAMINATION: Vital signs: BP 100/65   Pulse 74   Ht '5\' 9"'$  (1.753 m)   Wt 169 lb (76.7 kg)   SpO2 97%   BMI 24.96 kg/m   Constitutional: generally well-appearing, no acute distress Psychiatric: alert and oriented x3, cooperative Eyes: extraocular movements intact, anicteric, conjunctiva pink Mouth: oral pharynx moist, no lesions Neck: supple no lymphadenopathy Cardiovascular: heart regular rate and rhythm, no murmur Lungs: clear to auscultation bilaterally Abdomen: soft, nontender, nondistended, no obvious ascites, no peritoneal signs, normal bowel sounds, no organomegaly Rectal: Deferred until colonoscopy Extremities: no clubbing, cyanosis, or lower extremity edema bilaterally Skin: no lesions on visible extremities Neuro: No focal deficits.  Cranial nerves intact  ASSESSMENT:  1.  1 year history of intermittent rectal pain and bleeding.  Most consistent with anal fissure 2.  Known internal hemorrhoids on previous colonoscopy and at the time of your recent surgical evaluation 3.  Colonoscopy 2015 with diverticulosis and internal hemorrhoids   PLAN:  1.  We discussed the pathophysiology of anal fissure as well as external/internal hemorrhoids.  We discussed the associated symptoms of the various conditions and treatment strategies. 2.  Proceed colonoscopy to rule out other causes for intermittent  bleeding and discomfort.  As well as hemorrhoids.  Patient is interested in proceeding.The nature of the procedure, as well as the risks, benefits, and alternatives were carefully and thoroughly reviewed with the patient. Ample time for discussion and questions allowed. The patient understood, was satisfied, and agreed to proceed. 3.   Recommended fiber supplementation such as Metamucil daily 4.  For anal fissure discussed diltiazem ointment.  Held off on prescribing until colonoscopy completed 5.  Discussed treatment for internal hemorrhoids.  If this is an issue he may be a candidate for an office banding procedure.  To discuss further

## 2021-02-25 NOTE — Telephone Encounter (Signed)
Appendectomy added to surgical history.

## 2021-02-28 ENCOUNTER — Other Ambulatory Visit: Payer: Self-pay

## 2021-02-28 ENCOUNTER — Ambulatory Visit (AMBULATORY_SURGERY_CENTER): Payer: 59 | Admitting: Internal Medicine

## 2021-02-28 ENCOUNTER — Encounter: Payer: Self-pay | Admitting: Internal Medicine

## 2021-02-28 VITALS — BP 103/68 | HR 62 | Temp 97.8°F | Resp 15 | Ht 69.0 in | Wt 169.0 lb

## 2021-02-28 DIAGNOSIS — K635 Polyp of colon: Secondary | ICD-10-CM

## 2021-02-28 DIAGNOSIS — K602 Anal fissure, unspecified: Secondary | ICD-10-CM | POA: Diagnosis not present

## 2021-02-28 DIAGNOSIS — D122 Benign neoplasm of ascending colon: Secondary | ICD-10-CM

## 2021-02-28 DIAGNOSIS — K648 Other hemorrhoids: Secondary | ICD-10-CM | POA: Diagnosis not present

## 2021-02-28 DIAGNOSIS — K6289 Other specified diseases of anus and rectum: Secondary | ICD-10-CM

## 2021-02-28 DIAGNOSIS — D125 Benign neoplasm of sigmoid colon: Secondary | ICD-10-CM

## 2021-02-28 DIAGNOSIS — K625 Hemorrhage of anus and rectum: Secondary | ICD-10-CM

## 2021-02-28 MED ORDER — SODIUM CHLORIDE 0.9 % IV SOLN: 500.0000 mL | Freq: Once | INTRAVENOUS | Status: DC

## 2021-02-28 NOTE — Progress Notes (Signed)
Pt's states no medical or surgical changes since previsit or office visit. 

## 2021-02-28 NOTE — Progress Notes (Signed)
Report to PACU, RN, vss, BBS= Clear.  

## 2021-02-28 NOTE — Op Note (Signed)
[facility] Patient Name: Calvin Adams Procedure Date: 02/28/2021 11:55 AM MRN: [id number] Endoscopist: Docia Chuck. Henrene Pastor , MD Age: 60 Referring MD:  Date of Birth: 1960/09/23 Gender: Male Account #: [id number] Procedure:                Colonoscopy with cold snare polypectomy x 3; with                            biopsies Indications:              Rectal bleeding, Rectal pain. Previous colonoscopy                            2015 Medicines:                Monitored Anesthesia Care Procedure:                Pre-Anesthesia Assessment:                           - Prior to the procedure, a History and Physical                            was performed, and patient medications and                            allergies were reviewed. The patient's tolerance of                            previous anesthesia was also reviewed. The risks                            and benefits of the procedure and the sedation                            options and risks were discussed with the patient.                            All questions were answered, and informed consent                            was obtained. Prior Anticoagulants: The patient has                            taken no previous anticoagulant or antiplatelet                            agents. ASA Grade Assessment: II - A patient with                            mild systemic disease. After reviewing the risks                            and benefits, the patient was deemed in  satisfactory condition to undergo the procedure.                           After obtaining informed consent, the colonoscope                            was passed under direct vision. Throughout the                            procedure, the patient's blood pressure, pulse, and                            oxygen saturations were monitored continuously. The                            CF [id number] was introduced through the anus                             and advanced to the the cecum, identified by                            appendiceal orifice and ileocecal valve. The                            ileocecal valve, appendiceal orifice, and rectum                            were photographed. The quality of the bowel                            preparation was excellent. The colonoscopy was                            performed without difficulty. The patient tolerated                            the procedure well. The bowel preparation used was                            SUPREP via split dose instruction. Scope In: 12:08:35 PM Scope Out: 12:24:00 PM Scope Withdrawal Time: 0 hours 11 minutes 36 seconds  Total Procedure Duration: 0 hours 15 minutes 25 seconds  Findings:                 Three polyps were found in the sigmoid colon and                            ascending colon. The polyps were 2 to 5 mm in size.                            These polyps were removed with a cold snare.                            Resection and retrieval  were complete.                           A less than 1 mm polyp was found in the transverse                            colon. Biopsies were taken with a cold forceps for                            histology.                           A few diverticula were found in the colon.                           Internal hemorrhoids were found during                            retroflexion. The hemorrhoids were small.                           The exam was otherwise without abnormality on                            direct and retroflexion views. Complications:            No immediate complications. Estimated blood loss:                            None. Estimated Blood Loss:     Estimated blood loss: none. Impression:               - Three 2 to 5 mm polyps in the sigmoid colon and                            in the ascending colon, removed with a cold snare.                            Resected and retrieved.                            - One less than 1 mm polyp in the transverse colon.                            Biopsied.                           - Diverticulosis.                           - Internal hemorrhoids.                           - The examination was otherwise normal on direct                            and retroflexion  views.                           - Recent problems with rectal pain secondary to                            fissure which has now healed Recommendation:           - Repeat colonoscopy in 5 years for surveillance.                           - Patient has a contact number available for                            emergencies. The signs and symptoms of potential                            delayed complications were discussed with the                            patient. Return to normal activities tomorrow.                            Written discharge instructions were provided to the                            patient.                           - Resume previous diet.                           - Continue present medications.                           - Await pathology results.                           -Metamucil daily. Docia Chuck. Henrene Pastor, MD 02/28/2021 12:36:14 PM This report has been signed electronically.

## 2021-02-28 NOTE — Patient Instructions (Signed)
Handouts given for polyps and diverticulosis.  YOU HAD AN ENDOSCOPIC PROCEDURE TODAY AT THE [facility]:   Refer to the procedure report that was given to you for any specific questions about what was found during the examination.  If the procedure report does not answer your questions, please call your gastroenterologist to clarify.  If you requested that your care partner not be given the details of your procedure findings, then the procedure report has been included in a sealed envelope for you to review at your convenience later.  YOU SHOULD EXPECT: Some feelings of bloating in the abdomen. Passage of more gas than usual.  Walking can help get rid of the air that was put into your GI tract during the procedure and reduce the bloating. If you had a lower endoscopy (such as a colonoscopy or flexible sigmoidoscopy) you may notice spotting of blood in your stool or on the toilet paper. If you underwent a bowel prep for your procedure, you may not have a normal bowel movement for a few days.  Please Note:  You might notice some irritation and congestion in your nose or some drainage.  This is from the oxygen used during your procedure.  There is no need for concern and it should clear up in a day or so.  SYMPTOMS TO REPORT IMMEDIATELY:  Following lower endoscopy (colonoscopy or flexible sigmoidoscopy):  Excessive amounts of blood in the stool  Significant tenderness or worsening of abdominal pains  Swelling of the abdomen that is new, acute  Fever of 100F or higher  For urgent or emergent issues, a gastroenterologist can be reached at any hour by calling [phone]. Do not use MyChart messaging for urgent concerns.    DIET:  We do recommend a small meal at first, but then you may proceed to your regular diet.  Drink plenty of fluids but you should avoid alcoholic beverages for 24 hours.  ACTIVITY:  You should plan to take it easy for the rest of today and you should NOT DRIVE  or use heavy machinery until tomorrow (because of the sedation medicines used during the test).    FOLLOW UP: Our staff will call the number listed on your records 48-72 hours following your procedure to check on you and address any questions or concerns that you may have regarding the information given to you following your procedure. If we do not reach you, we will leave a message.  We will attempt to reach you two times.  During this call, we will ask if you have developed any symptoms of COVID 19. If you develop any symptoms (ie: fever, flu-like symptoms, shortness of breath, cough etc.) before then, please call [phone].  If you test positive for Covid 19 in the 2 weeks post procedure, please call and report this information to us.    If any biopsies were taken you will be contacted by phone or by letter within the next 1-3 weeks.  Please call us at [phone] if you have not heard about the biopsies in 3 weeks.    SIGNATURES/CONFIDENTIALITY: You and/or your care partner have signed paperwork which will be entered into your electronic medical record.  These signatures attest to the fact that that the information above on your After Visit Summary has been reviewed and is understood.  Full responsibility of the confidentiality of this discharge information lies with you and/or your care-partner.  

## 2021-02-28 NOTE — Progress Notes (Signed)
Called to room to assist during endoscopic procedure.  Patient ID and intended procedure confirmed with present staff. Received instructions for my participation in the procedure from the performing physician.  

## 2021-03-04 ENCOUNTER — Telehealth: Payer: Self-pay | Admitting: *Deleted

## 2021-03-04 NOTE — Telephone Encounter (Signed)
  Follow up Call-  Call back number 02/28/2021  Post procedure Call Back phone  # [phone]  Permission to leave phone message Yes  Some recent data might be hidden     Patient questions:  Do you have a fever, pain , or abdominal swelling? No. Pain Score  0 *  Have you tolerated food without any problems? Yes.    Have you been able to return to your normal activities? Yes.    Do you have any questions about your discharge instructions: Diet   No. Medications  No. Follow up visit  No.  Do you have questions or concerns about your Care? No.  Actions: * If pain score is 4 or above: No action needed, pain <4.  Have you developed a fever since your procedure? no  2.   Have you had an respiratory symptoms (SOB or cough) since your procedure? no  3.   Have you tested positive for COVID 19 since your procedure no  4.   Have you had any family members/close contacts diagnosed with the COVID 19 since your procedure?  no   If yes to any of these questions please route to Joylene John, RN and Joella Prince, RN

## 2021-03-05 ENCOUNTER — Encounter: Payer: Self-pay | Admitting: Internal Medicine

## 2021-03-07 ENCOUNTER — Encounter: Payer: Self-pay | Admitting: Physician Assistant

## 2021-03-07 ENCOUNTER — Telehealth (INDEPENDENT_AMBULATORY_CARE_PROVIDER_SITE_OTHER): Payer: 59 | Admitting: Physician Assistant

## 2021-03-07 VITALS — Temp 98.9°F | Ht 69.0 in

## 2021-03-07 DIAGNOSIS — R053 Chronic cough: Secondary | ICD-10-CM

## 2021-03-07 DIAGNOSIS — U071 COVID-19: Secondary | ICD-10-CM | POA: Diagnosis not present

## 2021-03-07 MED ORDER — NIRMATRELVIR/RITONAVIR (PAXLOVID)TABLET: 3.0000 | ORAL_TABLET | Freq: Two times a day (BID) | ORAL | 0 refills | Status: AC

## 2021-03-07 NOTE — Progress Notes (Signed)
Virtual Visit via Video Note  I connected with Stefano Gaul on 03/07/21 at  4:00 PM EDT by a video enabled telemedicine application and verified that I am speaking with the correct person using two identifiers.  Location: Patient: home Provider: Work from home Persons present: Patient and myself   I discussed the limitations of evaluation and management by telemedicine and the availability of in person appointments. The patient expressed understanding and agreed to proceed.   History of Present Illness: Chief complaint: COVID+ antigen home test last night  Symptom onset: Two days ago Pertinent positives: Cough, ST, headache, fatigue, body aches Pertinent negatives: Chest pain, shortness of breath Treatments tried: Tylenol, Ibuprofen Vaccine status: Pfizer x 2 plus one booster Sick exposure: Unsure    Observations/Objective:  Gen: Awake, alert, no acute distress Resp: Breathing is even and non-labored Psych: calm/pleasant demeanor Neuro: Alert and Oriented x 3, + facial symmetry, speech is clear.   Assessment and Plan: 1. COVID-19 2. Chronic cough Diagnosis confirmed via home antigen test.  Patient is currently having mild to moderate symptoms.  We discussed current algorithm recommendations for prescribing outpatient antivirals. Risks versus benefits discussed. He is within 5 day prescribing window. Long conversation with him about his chronic cough of unknown etiology; he is very worried about this worsening with COVID-19. Both agreed to try Paxlovid at this time. Last GFR normal. Side effects of dysgeusia, dizziness, and nausea possible.  Advised self-isolation at home for the next 5 days and then masking around others for at least an additional 5 days.  Treat supportively at this time as well including sleeping prone, deep breathing exercises, pushing fluids, walking every few hours, vitamins C and D, and Tylenol or ibuprofen as needed.  The patient understands that COVID-19  illness can wax and wane.  Should the symptoms acutely worsen or patient starts to experience sudden shortness of breath, chest pain, severe weakness, the patient will go straight to the emergency department.  Also advised home pulse oximetry monitoring and for any reading consistently under 92%, should also report to the emergency department.  The patient will continue to keep [place] updated.    Follow Up Instructions:    I discussed the assessment and treatment plan with the patient. The patient was provided an opportunity to ask questions and all were answered. The patient agreed with the plan and demonstrated an understanding of the instructions.   The patient was advised to call back or seek an in-person evaluation if the symptoms worsen or if the condition fails to improve as anticipated.  Total encounter time including review of most recent labs and discussion of symptoms and plan : 34 minutes  Kenyada Hy M Rory Xiang, PA-C

## 2021-03-21 ENCOUNTER — Telehealth: Payer: Self-pay

## 2021-03-21 NOTE — Telephone Encounter (Signed)
Notified patient of message below.Requesting a antibiotic and something for cough,he still has a cough and congestion he wants to schedule with Dr. Yong Channel informed Dr.Hunter has no available appointments. Offered to schedule with Dr.Parker opening he will go to urgent care or call back if needed.

## 2021-03-21 NOTE — Telephone Encounter (Signed)
Patient was seen on 8/5 for Covid and patient is still experiencing a cough and patient would like some antibiotics sent in to help with this can we send In some for him. To [phone] - [address] [place], [place]

## 2021-05-19 NOTE — Progress Notes (Incomplete)
Phone: [phone]   Subjective:  Patient presents today for their annual physical. Chief complaint-noted.   See problem oriented charting- ROS- full  review of systems was completed and negative  except for: ***  The following were reviewed and entered/updated in epic: Past Medical History:  Diagnosis Date   Allergy    Arthritis    Hemorrhoids    Hyperlipidemia    Umbilical hernia    Patient Active Problem List   Diagnosis Date Noted   Cough 46/50/3546   Umbilical pain 56/81/2751   Post-inflammatory hyperpigmentation 04/23/2016   Osteoarthritis 06/04/2015   Polyarticular osteoarthritis 70/08/7492   Hx of umbilical hernia repair 49/67/5916   Tendinopathy of right rotator cuff 12/16/2009   BACK PAIN 11/29/2009   Testicular hypofunction 07/29/2009   ERECTILE DYSFUNCTION, ORGANIC 04/24/2009   Osteoarthritis of multiple joints 04/16/2008   HERPES, GENITAL NOS 05/30/2007   Hypertriglyceridemia 05/30/2007   ALLERGIC RHINITIS 05/30/2007   Asthma 05/30/2007   Erythema nodosum 05/30/2007   Past Surgical History:  Procedure Laterality Date   APPENDECTOMY     COLONOSCOPY     spermaticeal removal     in [place], [place]  08/04/2011    Family History  Problem Relation Age of [place]   Hypertension Mother    Brain cancer Father        brain tumor, died before patient was born   Colon polyps Sister    Diabetes Sister    Breast cancer Maternal Aunt        several aunts   Colon cancer Neg Hx    Esophageal cancer Neg Hx    Rectal cancer Neg Hx    Stomach cancer Neg Hx    Other Neg Hx        hypogonadism    Medications- reviewed and updated Current Outpatient Medications  Medication Sig Dispense Refill   fenofibrate 160 MG tablet Take 1 tablet (160 mg total) by mouth daily. 90 tablet 3   hydrocortisone cream 0.5 % Apply 1 application topically 2 (two) times daily. Using OTC as needed.     No current facility-administered medications for this  visit.    Allergies-reviewed and updated No Known Allergies  Social History   Social History Narrative   From [place], [place]. Moved [place] in 2002.    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    Lives alone. Single. 2 brothers and a sister    Hobbies: working in yard, gardening, working out   Objective  Objective:  There were no vitals taken for this visit. Gen: NAD, resting comfortably HEENT: Mucous membranes are moist. Oropharynx normal Neck: no thyromegaly CV: RRR no murmurs rubs or gallops Lungs: CTAB no crackles, wheeze, rhonchi Abdomen: soft/nontender/nondistended/normal bowel sounds. No rebound or guarding.  Ext: no edema Skin: warm, dry Neuro: grossly normal, moves all extremities, PERRLA ***   Assessment and Plan  60 y.o. male presenting for annual physical.  Health Maintenance counseling: 1. Anticipatory guidance: Patient counseled regarding regular dental exams ***q6 months, eye exams ***yearly,  avoiding smoking and second hand smoke*** , limiting alcohol to 2 beverages per day - under that by far.  ***.  No illicit drugs - *** 2. Risk factor reduction:  Advised patient of need for regular exercise and diet rich and fruits and vegetables to reduce risk of heart attack and stroke. EExercise- has not been to the gym since covid hit. Encouraged restart at the gym. *** Diet-feels has lost muscle mass-  Weight  down 1 pound from last physical in 2019. Discussed improving diet- feels does too much junk food- on the road a lot *** Wt Readings from Last 3 Encounters:  02/28/21 169 lb (76.7 kg)  02/24/21 169 lb (76.7 kg)  11/19/20 167 lb 12.8 oz (76.1 kg)   3. Immunizations/screenings/ancillary studies DISCUSSED:  -Flu vaccination #1 - *** -COVID booster vaccination #4 - *** Immunization History  Administered Date(s) Administered   PFIZER(Purple Top)SARS-COV-2 Vaccination 10/13/2019, 11/09/2019, 06/25/2020   Td 08/04/2003   Tdap 03/14/2014   Zoster Recombinat (Shingrix)  08/19/2017, 10/23/2017   Health Maintenance Due  Topic Date Due   COVID-19 Vaccine (4 - Booster for Pfizer series) 09/17/2020   INFLUENZA VACCINE  Never done   4. Prostate cancer screening- prior PSA trend has been low risk-trend with labs today.  Rectal exam low risk last visit and we opted to defer***  Lab Results  Component Value Date   PSA 1.69 05/24/2020   PSA 1.94 03/08/2018   PSA 2.12 02/08/2017   5. Colon cancer screening - colonoscopy 02/01/14 with 10- year repeat*** 6. Skin cancer screening- has seen [facility] dermatology in the past-has appointment November 1st.***advised regular sunscreen use. Denies worrisome, changing, or new skin lesions.  7. Smoking associated screening (lung cancer screening, AAA screen 65-75, UA)- never smoker- *** 8. STD screening -  denies unprotected sex/declines ***  Status of chronic or acute concerns   # History of COVID-19 S:Had a video visit with Alyssa Allwardt, PA-C on 03/07/21 and reported chronic cough of unknown etiology, headache, fatigue, body aches and ST that started two days before visit.  -Trailed Paxlovid.   A/P: ***   #hypertriglyceridemia S: Medication: Fenofibrate 160mg  daily Lab Results  Component Value Date   CHOL 190 05/24/2020   HDL 47 05/24/2020   LDLCALC 116 (H) 05/24/2020   LDLDIRECT 112.0 03/08/2018   TRIG 153 (H) 05/24/2020   CHOLHDL 4.0 05/24/2020   A/P: ***  #History of low testosterone-had prior issues with shortness of breath on testosterone but he reflects back and wonders if this may have been allergies.  Last testosterone levels were low but not between 8:52 AM fasting and we encouraged repeat testing-repeat testing fasting was normal- repeat today. Low libido reported   # Chronic cough- seen DR. Ramaswamy and had high resolution CT. Prior long term exposure to bird droppings. Worsened in AM- if cleared morning congestin good for rest of day. Thought was this could be cough neuropathy or irritable larynx  syndrome. No further workup at this time.  - allergies would recommend flonase for 2 weeks and then do add on therapy with claritin. Also wanted him to try nasal sinus rinse before bed with neti pot or neil med sinus rinse. Could be an allergy element -used Nn95 with his birds. Dust from food bothered him even if wore his mask   # CCP antibody positive trace/ongoing arthralgias. Was getting early morning stiffness and improved after 20-30 minutes. Had ongoing isuses with work that made things very challenging. Worsened is in his feet- tylenol or aleve were helpful. Diffused arthralgias- finger joints and reported swelling at times in joints. Reported seeing rheumatology years ago- extensive blood work and nothing really came of it plus was very expnesive- wanted to hold off on repeat referral for now but has concern about insurance  Recommended follow up: No follow-ups on file. Future Appointments  Date Time Provider [place]  05/26/2021  1:20 PM Marin Olp, MD [facility]  No chief complaint on file.  Lab/Order associations:*** fasting No diagnosis found.  No orders of the defined types were placed in this encounter.   I,Jada Bradford,acting as a scribe for Garret Reddish, MD.,have documented all relevant documentation on the behalf of Garret Reddish, MD,as directed by  Garret Reddish, MD while in the presence of Garret Reddish, MD.  *** Return precautions advised.  Burnett Corrente

## 2021-05-26 ENCOUNTER — Encounter: Payer: 59 | Admitting: Family Medicine

## 2021-05-26 DIAGNOSIS — Z23 Encounter for immunization: Secondary | ICD-10-CM

## 2021-05-26 DIAGNOSIS — Z0000 Encounter for general adult medical examination without abnormal findings: Secondary | ICD-10-CM

## 2021-05-26 DIAGNOSIS — E781 Pure hyperglyceridemia: Secondary | ICD-10-CM

## 2021-08-15 NOTE — Progress Notes (Signed)
Phone: [phone]   Subjective:  Patient presents today for their annual physical. Chief complaint-noted.   See problem oriented charting- ROS- full  review of systems was completed and negative  except for: joint pain, low libido unchanged   The following were reviewed and entered/updated in epic: Past Medical History:  Diagnosis Date   Allergy    Arthritis    Hemorrhoids    Hyperlipidemia    Umbilical hernia    Patient Active Problem List   Diagnosis Date Noted   Testicular hypofunction 07/29/2009    Priority: Medium    Osteoarthritis of multiple joints 04/16/2008    Priority: Medium    Hypertriglyceridemia 05/30/2007    Priority: Medium    Asthma 05/30/2007    Priority: Medium    Erythema nodosum 05/30/2007    Priority: Medium    Post-inflammatory hyperpigmentation 04/23/2016    Priority: Low   Osteoarthritis 06/04/2015    Priority: Low   Polyarticular osteoarthritis 03/24/2013    Priority: Low   Hx of umbilical hernia repair 53/97/6734    Priority: Low   Tendinopathy of right rotator cuff 12/16/2009    Priority: Low   BACK PAIN 11/29/2009    Priority: Low   ERECTILE DYSFUNCTION, ORGANIC 04/24/2009    Priority: Low   HERPES, GENITAL NOS 05/30/2007    Priority: Low   ALLERGIC RHINITIS 05/30/2007    Priority: Low   Cough 19/37/9024   Umbilical pain 09/73/5329   Past Surgical History:  Procedure Laterality Date   APPENDECTOMY     COLONOSCOPY     spermaticeal removal     in [place], [place]  08/04/2011    Family History  Problem Relation Age of Onset   Hypertension Mother    Brain cancer Father        brain tumor, died before patient was born   Colon polyps Sister    Diabetes Sister    Breast cancer Maternal Aunt        several aunts   Colon cancer Neg Hx    Esophageal cancer Neg Hx    Rectal cancer Neg Hx    Stomach cancer Neg Hx    Other Neg Hx        hypogonadism    Medications- reviewed and updated Current  Outpatient Medications  Medication Sig Dispense Refill   fenofibrate 160 MG tablet Take 1 tablet (160 mg total) by mouth daily. 90 tablet 3   hydrocortisone cream 0.5 % Apply 1 application topically 2 (two) times daily. Using OTC as needed.     No current facility-administered medications for this visit.    Allergies-reviewed and updated No Known Allergies  Social History   Social History Narrative   From [place], [place]. Moved [place] in 2002.    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    Lives alone. Single. 2 brothers and a sister    Hobbies: working in yard, gardening, working out   Objective  Objective:  BP 104/76    Pulse 66    Temp 97.6 F (36.4 C)    Ht 5\' 9"  (1.753 m)    Wt 164 lb 3.2 oz (74.5 kg)    SpO2 98%    BMI 24.25 kg/m  Gen: NAD, resting comfortably HEENT: Mucous membranes are moist. Oropharynx normal Neck: no thyromegaly CV: RRR no murmurs rubs or gallops Lungs: CTAB no crackles, wheeze, rhonchi Abdomen: soft/nontender/nondistended/normal bowel sounds. No rebound or guarding.  Prior  umbilical hernia  Ext:  no edema Skin: warm, dry Neuro: grossly normal, moves all extremities, PERRLA    Assessment and Plan  61 y.o. male presenting for annual physical.  Health Maintenance counseling: 1. Anticipatory guidance: Patient counseled regarding regular dental exams -q6 months, eye exams -yearly in general- advised to update,  avoiding smoking and second hand smoke , limiting alcohol to 2 beverages per day -under that by far.    No illicit drugs.  2. Risk factor reduction:  Advised patient of need for regular exercise and diet rich and fruits and vegetables to reduce risk of heart attack and stroke.  Exercise-- has been down/nonexistant caring for mom- has gym membership Diet/weight management- down 6 lbs from last CPE but feels like being out of gym he has lost muscle mass. Weight actually normal range. Encouraged healthy diet.  Wt Readings from Last 3 Encounters:  08/20/21  164 lb 3.2 oz (74.5 kg)  02/28/21 169 lb (76.7 kg)  02/24/21 169 lb (76.7 kg)  3. Immunizations/screenings/ancillary studies DISCUSSED:  -Flu vaccination - opts out -COVID booster vaccination #4- opts out for now # Hx of COVID - August 2022- received pfizer x2 and one booster.  He prefers to hold off -Prevnar-20 vaccination #1- asthma history but no significant issues (some mild cough but cough is better if misses fenofibrate) wants to hold off Immunization History  Administered Date(s) Administered   PFIZER(Purple Top)SARS-COV-2 Vaccination 10/13/2019, 11/09/2019, 06/25/2020   Td 08/04/2003   Tdap 03/14/2014   Zoster Recombinat (Shingrix) 08/19/2017, 10/23/2017   4. Prostate cancer screening- prior PSA trend has been low risk-trend with labs today.  Rectal exam low risk in past  Lab Results  Component Value Date   PSA 1.69 05/24/2020   PSA 1.94 03/08/2018   PSA 2.12 02/08/2017   5. Colon cancer screening - colonoscopy 02/28/21 with 5 year repeat planneddue to adenomatous and sessile serrated polyps 6. Skin cancer screening- has seen [facility] dermatology in the past-enjoying seeing Dr. Pearline Cables- seen recently. advised regular sunscreen use.  7. Smoking associated screening (lung cancer screening, AAA screen 65-75, UA)- Never smoker 8. STD screening - denies unprotected sex/declines- too busy right now!   Status of chronic or acute concerns   #social update- a lot of stress caring for mom in [place]- now living with sister. Had fall and big set back. Incontinent and memory continues to declines with dementia. Even on a peg tube related to severe thrush. His social life very very limited as a result. Mom is also not doing very well.  -possible could move to [place] with him and I would be willing to accept  #hyperlipidemia S: Medication: Fenofibrate 160 mg. Cough issues better if he misses dose. Not missing many doses recently Lab Results  Component Value Date   CHOL 190 05/24/2020   HDL 47  05/24/2020   LDLCALC 116 (H) 05/24/2020   LDLDIRECT 112.0 03/08/2018   TRIG 153 (H) 05/24/2020   CHOLHDL 4.0 05/24/2020   A/P: Reasonable control last check-update lipid panel with labs today along with CBC and CMP -risk at 5.1% today- if gets above 7.5% would be interested in ct cardiac scoring    # history of Rectal irritation- healed after fissure better- stool softener helped.  #Grade III hemorrhoids per Dr. Pearline Cables- these were internal and not bothering him now. No need for surgery. Hydrocortisone helps if needed.   #History of low testosterone-had prior issues with shortness of breath on testosterone but he reflected back and wondered if this may had been  allergies.  Last testosterone levels were low but not between 8:52 AM fasting and we encouraged repeat testing-repeat testing fasting was normal- repeated 05/2020. Low libido reported again today but a lot of stress and prior normal- we opted to hold off for now  Lab Results  Component Value Date   TESTOSTERONE 427 05/29/2020    # Chronic cough- seen DR. Ramaswamy and had high resolution CT. Prior long term exposure to bird droppings. Worsened in AM- if cleared morning congestin good for rest of day. Thought was this could be cough neuropathy or irritable larynx syndrome. No further workup at this time.  - allergies would recommend flonase for 2 weeks and then do add on therapy with claritin. Also wanted him to try nasal sinus rinse before bed with neti pot or neil med sinus rinse. Could be an allergy element -uses Nn95 with his birds. Dust from food bothered him even if wore his mask -Does have asthma but no recent wheezing.  Discussed having albuterol on hand to see if it helps with cough- doesn't help - only link he has found is if he misses fenofibrate it improves    # CCP antibody positive trace (weak positive)/ongoing arthralgias.  - doing better as not working out as much but still gets morning stiffness that improves within 30  minutes. Not having to use tylenol arthritis as much. Prior had considered rheum workup but had wanted to hold off and since doing better.   Recommended follow up: No follow-ups on file.  Lab/Order associations: fasting   ICD-10-CM   1. Preventative health care  Z00.00 CBC with Differential/Platelet    Comprehensive metabolic panel    Lipid panel    PSA    2. Hypertriglyceridemia  E78.1 CBC with Differential/Platelet    Comprehensive metabolic panel    Lipid panel    3. Screening for prostate cancer  Z12.5 PSA      No orders of the defined types were placed in this encounter.  I,Jada Bradford,acting as a scribe for Garret Reddish, MD.,have documented all relevant documentation on the behalf of Garret Reddish, MD,as directed by  Garret Reddish, MD while in the presence of Garret Reddish, MD.   I, Garret Reddish, MD, have reviewed all documentation for this visit. The documentation on 08/20/21 for the exam, diagnosis, procedures, and orders are all accurate and complete.   Return precautions advised.  Garret Reddish, MD

## 2021-08-20 ENCOUNTER — Encounter: Payer: Self-pay | Admitting: Family Medicine

## 2021-08-20 ENCOUNTER — Other Ambulatory Visit: Payer: Self-pay

## 2021-08-20 ENCOUNTER — Ambulatory Visit (INDEPENDENT_AMBULATORY_CARE_PROVIDER_SITE_OTHER): Payer: Managed Care, Other (non HMO) | Admitting: Family Medicine

## 2021-08-20 VITALS — BP 104/76 | HR 66 | Temp 97.6°F | Ht 69.0 in | Wt 164.2 lb

## 2021-08-20 DIAGNOSIS — Z125 Encounter for screening for malignant neoplasm of prostate: Secondary | ICD-10-CM

## 2021-08-20 DIAGNOSIS — E781 Pure hyperglyceridemia: Secondary | ICD-10-CM

## 2021-08-20 DIAGNOSIS — Z0000 Encounter for general adult medical examination without abnormal findings: Secondary | ICD-10-CM | POA: Diagnosis not present

## 2021-08-20 LAB — COMPREHENSIVE METABOLIC PANEL
ALT: 19 U/L (ref 0–53)
AST: 17 U/L (ref 0–37)
Albumin: 4.5 g/dL (ref 3.5–5.2)
Alkaline Phosphatase: 47 U/L (ref 39–117)
BUN: 13 mg/dL (ref 6–23)
CO2: 28 mEq/L (ref 19–32)
Calcium: 9.3 mg/dL (ref 8.4–10.5)
Chloride: 106 mEq/L (ref 96–112)
Creatinine, Ser: 0.96 mg/dL (ref 0.40–1.50)
GFR: 86.96 mL/min (ref >60.00)
Glucose, Bld: 89 mg/dL (ref 70–99)
Potassium: 4.6 mEq/L (ref 3.5–5.1)
Sodium: 142 mEq/L (ref 135–145)
Total Bilirubin: 0.9 mg/dL (ref 0.2–1.2)
Total Protein: 6.4 g/dL (ref 6.0–8.3)

## 2021-08-20 LAB — CBC WITH DIFFERENTIAL/PLATELET
Basophils Absolute: 0 10*3/uL (ref 0.0–0.1)
Basophils Relative: 0.8 % (ref 0.0–3.0)
Eosinophils Absolute: 0.1 10*3/uL (ref 0.0–0.7)
Eosinophils Relative: 3.1 % (ref 0.0–5.0)
HCT: 44.8 % (ref 39.0–52.0)
Hemoglobin: 14.7 g/dL (ref 13.0–17.0)
Lymphocytes Relative: 21.6 % (ref 12.0–46.0)
Lymphs Abs: 0.9 10*3/uL (ref 0.7–4.0)
MCHC: 32.7 g/dL (ref 30.0–36.0)
MCV: 91.1 fl (ref 78.0–100.0)
Monocytes Absolute: 0.4 10*3/uL (ref 0.1–1.0)
Monocytes Relative: 9.9 % (ref 3.0–12.0)
Neutro Abs: 2.7 10*3/uL (ref 1.4–7.7)
Neutrophils Relative %: 64.6 % (ref 43.0–77.0)
Platelets: 193 10*3/uL (ref 150.0–400.0)
RBC: 4.92 Mil/uL (ref 4.22–5.81)
RDW: 12.9 % (ref 11.5–15.5)
WBC: 4.1 10*3/uL (ref 4.0–10.5)

## 2021-08-20 LAB — LIPID PANEL
Cholesterol: 181 mg/dL (ref 0–200)
HDL: 48.4 mg/dL (ref >39.00)
LDL Cholesterol: 105 mg/dL — ABNORMAL HIGH (ref 0–99)
NonHDL: 132.39
Total CHOL/HDL Ratio: 4
Triglycerides: 137 mg/dL (ref 0.0–149.0)
VLDL: 27.4 mg/dL (ref 0.0–40.0)

## 2021-08-20 LAB — PSA: PSA: 2.28 ng/mL (ref 0.10–4.00)

## 2021-08-20 NOTE — Patient Instructions (Addendum)
Please stop by lab before you go If you have mychart- we will send your results within 3 business days of [place] receiving them.  If you do not have mychart- we will call you about results within 5 business days of [place] receiving them.  *please also note that you will see labs on mychart as soon as they post. I will later go in and write notes on them- will say "notes from Dr. Yong Channel  Recommended follow up: Return in about 1 year (around 08/20/2022) for physical or sooner if needed.  I hope everything gets better on the home front- sorry for all you are going through :(

## 2021-09-16 ENCOUNTER — Telehealth: Payer: Self-pay | Admitting: Family Medicine

## 2021-09-16 MED ORDER — FENOFIBRATE 160 MG PO TABS: 160.0000 mg | ORAL_TABLET | Freq: Every day | ORAL | 3 refills | Status: DC

## 2021-09-16 NOTE — Telephone Encounter (Signed)
.. °  Encourage patient to contact the pharmacy for refills or they can request refills through [place]:  08/20/21  NEXT APPOINTMENT DATE: 08/21/22  MEDICATION:fenofibrate 160 MG tablet  Is the patient out of medication? yes  PHARMACY: [facility] [address] [place] - [address] Phone:  [phone]  Fax:  [phone]      Let patient know to contact pharmacy at the end of the day to make sure medication is ready.  Please notify patient to allow 48-72 hours to process

## 2021-09-16 NOTE — Telephone Encounter (Signed)
Refill sent to pharmacy.   

## 2021-09-23 ENCOUNTER — Other Ambulatory Visit: Payer: Self-pay

## 2021-09-23 ENCOUNTER — Encounter: Payer: Self-pay | Admitting: Family Medicine

## 2021-09-23 ENCOUNTER — Ambulatory Visit (INDEPENDENT_AMBULATORY_CARE_PROVIDER_SITE_OTHER): Payer: Managed Care, Other (non HMO) | Admitting: Family Medicine

## 2021-09-23 VITALS — BP 101/57 | HR 73 | Temp 98.3°F | Ht 69.0 in | Wt 168.0 lb

## 2021-09-23 DIAGNOSIS — M542 Cervicalgia: Secondary | ICD-10-CM | POA: Diagnosis not present

## 2021-09-23 DIAGNOSIS — J329 Chronic sinusitis, unspecified: Secondary | ICD-10-CM

## 2021-09-23 MED ORDER — AZITHROMYCIN 250 MG PO TABS: ORAL_TABLET | ORAL | 0 refills | Status: DC

## 2021-09-23 MED ORDER — AZELASTINE HCL 0.1 % NA SOLN: 2.0000 | Freq: Two times a day (BID) | NASAL | 12 refills | Status: DC

## 2021-09-23 NOTE — Patient Instructions (Signed)
It was very nice to see you today!  Please start the Astelin and antibiotic.  Work on exercises for your neck.  Let us know if not improving.  Take care, Dr Jerline Pain  PLEASE NOTE:  If you had any lab tests please let us know if you have not heard back within a few days. You may see your results on mychart before we have a chance to review them but we will give you a call once they are reviewed by [place]. If we ordered any referrals today, please let us know if you have not heard from their office within the next week.   Please try these tips to maintain a healthy lifestyle:  Eat at least 3 REAL meals and 1-2 snacks per day.  Aim for no more than 5 hours between eating.  If you eat breakfast, please do so within one hour of getting up.   Each meal should contain half fruits/vegetables, one quarter protein, and one quarter carbs (no bigger than a computer mouse)  Cut down on sweet beverages. This includes juice, soda, and sweet tea.   Drink at least 1 glass of water with each meal and aim for at least 8 glasses per day  Exercise at least 150 minutes every week.

## 2021-09-23 NOTE — Progress Notes (Signed)
° °  Calvin Adams is a 61 y.o. male who presents today for an office visit.  Assessment/Plan:  Sinusitis No red flags though given length of symptoms will start azithromycin.  This is worked well for him in the past but also start Astelin nasal spray.  Encourage good hydration.  Discussed reasons to return to care.  Follow-up as needed.  Neck Pain No red flags.  Likely muscular strain.  We discussed home exercise program and handout was given.  He will let us know if not improving.    Subjective:  HPI:  Patient here with sinusitis and earache. This started last week. Have some chest congestion associated with productive cough that bring up yellow mucus. He is feeling more fatigued. Had sore throat but this resolved. No fever or chills. He would like to start on medication for this issue. Covid test at home was negative yesterday.   He has been feeling some pressure in the back of his head. This has been going on for a while. Comes and goes. He has not tried anything. He notes he feel pressure and some pain in the head.       Objective:  Physical Exam: BP (!) 101/57 (BP Location: Left Arm)    Pulse 73    Temp 98.3 F (36.8 C) (Temporal)    Ht 5\' 9"  (1.753 m)    Wt 168 lb (76.2 kg)    SpO2 95%    BMI 24.81 kg/m   Gen: No acute distress, resting comfortably HEENT: TMs with clear effusion.  OP erythematous with no exudate.  Maxillary sinuses with decreased transillumination bilaterally.  Nasal mucosa erythematous and boggy bilaterally with clear discharge. CV: Regular rate and rhythm with no murmurs appreciated Pulm: Normal work of breathing, clear to auscultation bilaterally with no crackles, wheezes, or rhonchi MSK: - Neck: No deformities.  Some tenderness to palpation along paraspinal cervical muscle groups.  Neurovascular intact distally. Neuro: Grossly normal, moves all extremities Psych: Normal affect and thought content       I,Savera Zaman,acting as a scribe for Dimas Chyle, MD.,have documented all relevant documentation on the behalf of Dimas Chyle, MD,as directed by  Dimas Chyle, MD while in the presence of Dimas Chyle, MD.   I, Dimas Chyle, MD, have reviewed all documentation for this visit. The documentation on 09/23/21 for the exam, diagnosis, procedures, and orders are all accurate and complete.  Algis Greenhouse. Jerline Pain, MD 09/23/2021 3:32 PM

## 2021-12-30 ENCOUNTER — Telehealth: Payer: Self-pay | Admitting: Family Medicine

## 2021-12-30 NOTE — Telephone Encounter (Signed)
Please schedule virtual with available provider for pt as these have never been prescribed by Dr. Yong Channel and are not on current med list.

## 2021-12-30 NOTE — Telephone Encounter (Signed)
Pt states he recently got over a cold sore outbreak but states he is getting ready to have another one due to stress. He is asking to have 2 rx ordered. He is leaving town on 12/31/21 and can not come in for an appt. Please advise  MEDICATION: valACYclovir (VALTREX) 1000 MG tablet  ZOVIRAX 5 MG CREAM  PHARMACY: [facility] [address] [place] [address] Phone:  [phone]  Fax:  [phone]

## 2022-01-01 NOTE — Telephone Encounter (Signed)
LVM to schedule a virtual.  

## 2022-01-02 ENCOUNTER — Other Ambulatory Visit: Payer: Self-pay | Admitting: *Deleted

## 2022-01-02 ENCOUNTER — Telehealth: Payer: Self-pay | Admitting: Family Medicine

## 2022-01-02 ENCOUNTER — Encounter: Payer: Self-pay | Admitting: Family Medicine

## 2022-01-02 ENCOUNTER — Telehealth (INDEPENDENT_AMBULATORY_CARE_PROVIDER_SITE_OTHER): Payer: Commercial Managed Care - HMO | Admitting: Family Medicine

## 2022-01-02 DIAGNOSIS — B001 Herpesviral vesicular dermatitis: Secondary | ICD-10-CM

## 2022-01-02 MED ORDER — ACYCLOVIR 5 % EX OINT: 1.0000 "application " | TOPICAL_OINTMENT | CUTANEOUS | 1 refills | Status: DC

## 2022-01-02 MED ORDER — ACYCLOVIR 5 % EX CREA: 1.0000 "application " | TOPICAL_CREAM | CUTANEOUS | 1 refills | Status: DC

## 2022-01-02 MED ORDER — VALACYCLOVIR HCL 1 G PO TABS: 1000.0000 mg | ORAL_TABLET | Freq: Every day | ORAL | 3 refills | Status: DC

## 2022-01-02 NOTE — Progress Notes (Signed)
MyChart Video Visit    Virtual Visit via Video Note   This visit type was conducted due to national recommendations for restrictions regarding the COVID-19 Pandemic (e.g. social distancing) in an effort to limit this patient's exposure and mitigate transmission in our community. This patient is at least at moderate risk for complications without adequate follow up. This format is felt to be most appropriate for this patient at this time. Physical exam was limited by quality of the video and audio technology used for the visit. CMA was able to get the patient set up on a video visit.  Patient location: Home. Patient and provider in visit Provider location: Office  I discussed the limitations of evaluation and management by telemedicine and the availability of in person appointments. The patient expressed understanding and agreed to proceed.  Visit Date: 01/02/2022  Today's healthcare provider: Wellington Hampshire, MD     Subjective:    Patient ID: Calvin Adams, male    DOB: [birth year], 61 y.o.   MRN: [id number]  Chief Complaint  Patient presents with   Mouth Lesions    Wants medication for cold sores, does not have one now, but know that one is coming due to stress    HPI A lot of stress.  Mom end stage Alz, sis just dx bone marrow ca.    Going to [place] a lot. Pt has h/o cold sores a long time ago.   Now got cleared 1 wk ago and tingle starting again.  Does get Gen as well.   Past Medical History:  Diagnosis Date   Allergy    Arthritis    Hemorrhoids    Hyperlipidemia    Umbilical hernia     Past Surgical History:  Procedure Laterality Date   APPENDECTOMY     COLONOSCOPY     spermaticeal removal     in [place], [place]   UMBILICAL HERNIA REPAIR  08/04/2011    Outpatient Medications Prior to Visit  Medication Sig Dispense Refill   fenofibrate 160 MG tablet Take 1 tablet (160 mg total) by mouth daily. 90 tablet 3   azelastine (ASTELIN) 0.1 % nasal spray Place 2  sprays into both nostrils 2 (two) times daily. (Patient not taking: Reported on 01/02/2022) 30 mL 12   hydrocortisone cream 0.5 % Apply 1 application topically 2 (two) times daily. Using OTC as needed. (Patient not taking: Reported on 09/23/2021)     azithromycin (ZITHROMAX) 250 MG tablet Take 2 tabs day 1, then 1 tab daily 6 each 0   No facility-administered medications prior to visit.    No Known Allergies      Objective:     Physical Exam  Vitals and nursing note reviewed.  Constitutional:      General:  is not in acute distress.    Appearance: Normal appearance.  HENT:     Head: Normocephalic.  Pulmonary:     Effort: No respiratory distress.  Skin:    General: Skin is dry.     Coloration: Skin is not pale.  Neurological:     Mental Status: Pt is alert and oriented to person, place, and time.  Psychiatric:        Mood and Affect: Mood normal.   There were no vitals taken for this visit.  Wt Readings from Last 3 Encounters:  09/23/21 168 lb (76.2 kg)  08/20/21 164 lb 3.2 oz (74.5 kg)  02/28/21 169 lb (76.7 kg)  Assessment & Plan:   Problem List Items Addressed This Visit   None Visit Diagnoses     Cold sore    -  Primary      HSV-both.  Will do valacyclovir 1 g daily for 5 days.  Refills given.  If gets mult times(a lot of stress going on), then consider suppressive therapy.   No orders of the defined types were placed in this encounter.   I discussed the assessment and treatment plan with the patient. The patient was provided an opportunity to ask questions and all were answered. The patient agreed with the plan and demonstrated an understanding of the instructions.   The patient was advised to call back or seek an in-person evaluation if the symptoms worsen or if the condition fails to improve as anticipated.  I provided 25 minutes of face-to-face time during this encounter.   Wellington Hampshire, MD [place] [phone]  (phone) [phone] (fax)  [place]

## 2022-01-02 NOTE — Telephone Encounter (Signed)
Rx changed to ointment.

## 2022-01-02 NOTE — Telephone Encounter (Signed)
Pharmacy requests prescription to be rewritten from cream to ointment because the current price is approx. $700.  acyclovir cream (ZOVIRAX) 5 % [[id number]]

## 2022-01-02 NOTE — Telephone Encounter (Signed)
Please see note below. 

## 2022-01-02 NOTE — Patient Instructions (Signed)
It was very nice to see you today!  Sorry to hear about all the stress.   They correct dose is '1000mg'$  daily for 5 days so I sent in 10 tabs which would be 2 episodes.  Refills as well.    You can also do L lysine for the cold sores daily.     PLEASE NOTE:  If you had any lab tests please let us know if you have not heard back within a few days. You may see your results on MyChart before we have a chance to review them but we will give you a call once they are reviewed by [place]. If we ordered any referrals today, please let us know if you have not heard from their office within the next week.   Please try these tips to maintain a healthy lifestyle:  Eat most of your calories during the day when you are active. Eliminate processed foods including packaged sweets (pies, cakes, cookies), reduce intake of potatoes, white bread, white pasta, and white rice. Look for whole grain options, oat flour or almond flour.  Each meal should contain half fruits/vegetables, one quarter protein, and one quarter carbs (no bigger than a computer mouse).  Cut down on sweet beverages. This includes juice, soda, and sweet tea. Also watch fruit intake, though this is a healthier sweet option, it still contains natural sugar! Limit to 3 servings daily.  Drink at least 1 glass of water with each meal and aim for at least 8 glasses per day  Exercise at least 150 minutes every week.

## 2022-02-17 ENCOUNTER — Ambulatory Visit (INDEPENDENT_AMBULATORY_CARE_PROVIDER_SITE_OTHER): Payer: Commercial Managed Care - HMO | Admitting: Family Medicine

## 2022-02-17 ENCOUNTER — Encounter: Payer: Self-pay | Admitting: Family Medicine

## 2022-02-17 VITALS — BP 126/78 | HR 81 | Temp 98.3°F | Ht 69.0 in | Wt 167.0 lb

## 2022-02-17 DIAGNOSIS — R21 Rash and other nonspecific skin eruption: Secondary | ICD-10-CM

## 2022-02-17 DIAGNOSIS — E781 Pure hyperglyceridemia: Secondary | ICD-10-CM | POA: Diagnosis not present

## 2022-02-17 LAB — POCT GLYCOSYLATED HEMOGLOBIN (HGB A1C): Hemoglobin A1C: 5.4 % (ref 4.0–5.6)

## 2022-02-17 MED ORDER — KETOCONAZOLE 2 % EX CREA: 1.0000 | TOPICAL_CREAM | Freq: Two times a day (BID) | CUTANEOUS | 0 refills | Status: DC

## 2022-02-17 MED ORDER — DOXYCYCLINE HYCLATE 100 MG PO TABS
100.0000 mg | ORAL_TABLET | Freq: Two times a day (BID) | ORAL | 0 refills | Status: DC
Start: 2022-02-17 — End: 2022-03-24

## 2022-02-17 MED ORDER — DOXYCYCLINE HYCLATE 100 MG PO TABS: 100.0000 mg | ORAL_TABLET | Freq: Two times a day (BID) | ORAL | 0 refills | Status: DC

## 2022-02-17 NOTE — Progress Notes (Signed)
   Calvin Adams is a 61 y.o. male who presents today for an office visit.  Assessment/Plan:  Rash Likely tinea infection.  Balanitis is also a consideration though less likely given lack of exudative appearance and distribution of lesions.  His A1c today is 5.4.  Ee will treat with topical ketoconazole.  He is concerned about possible bacterial infection.  Reassured patient they do not have any overt signs of infection but I will send in a prescription for doxycycline to use if symptoms begin to worsen or if he develops any signs of bacterial infection.  Advised him to avoid using triamcinolone going forward.  He will let me know if not improving and may consider trial of oral antifungal versus referral to dermatology.  We discussed reasons to return to care.     Subjective:  HPI:  Patient here with rash on penis. This has been going on for several weeks. He did a telehealth visit about 6 weeks ago and was empirically started on valtrex due to concern for possible herpes.  Symptoms did not improve.  He has tried some leftover triamcinolone which helps a little bit however symptoms returned.  No obvious injuries.  No precipitating event.  No penile discharge.  No dysuria.  No fevers or chills.       Objective:  Physical Exam: BP 126/78   Pulse 81   Temp 98.3 F (36.8 C) (Temporal)   Ht '5\' 9"'$  (1.753 m)   Wt 167 lb (75.8 kg)   SpO2 96%   BMI 24.66 kg/m   Gen: No acute distress, resting comfortably CV: Regular rate and rhythm with no murmurs appreciated Pulm: Normal work of breathing, clear to auscultation bilaterally with no crackles, wheezes, or rhonchi GU: Well-circumscribed erythematous patches on shaft of penis approximately 1 to 2 cm in diameter. Neuro: Grossly normal, moves all extremities Psych: Normal affect and thought content      Lyndell Allaire M. Jerline Pain, MD 02/17/2022 3:19 PM

## 2022-02-17 NOTE — Patient Instructions (Signed)
It was very nice to see you today!  I think you have a fungal infection.  Please start the ketoconazole cream.  Let us know if not improving by later this week.  Take care, Dr Jerline Pain  PLEASE NOTE:  If you had any lab tests please let us know if you have not heard back within a few days. You may see your results on mychart before we have a chance to review them but we will give you a call once they are reviewed by [place]. If we ordered any referrals today, please let us know if you have not heard from their office within the next week.   Please try these tips to maintain a healthy lifestyle:  Eat at least 3 REAL meals and 1-2 snacks per day.  Aim for no more than 5 hours between eating.  If you eat breakfast, please do so within one hour of getting up.   Each meal should contain half fruits/vegetables, one quarter protein, and one quarter carbs (no bigger than a computer mouse)  Cut down on sweet beverages. This includes juice, soda, and sweet tea.   Drink at least 1 glass of water with each meal and aim for at least 8 glasses per day  Exercise at least 150 minutes every week.

## 2022-02-25 ENCOUNTER — Telehealth: Payer: Self-pay | Admitting: Family Medicine

## 2022-02-25 NOTE — Telephone Encounter (Signed)
Patient called to let Dr. Jerline Pain know how treatment is going. Patient states at first things got pretty much cleared up but then it came back pretty much full force. Patient states he stopped using ketoconazole today (7/26) and starting using Triazolone topical (which Patient states he already  had) to see how that works. Patient states he has one more dose of doxycycline.  Patient gives permission to leave a detailed message on his voice mail.

## 2022-02-26 NOTE — Telephone Encounter (Signed)
Dr. Jerline Pain, please see message.

## 2022-02-27 NOTE — Telephone Encounter (Signed)
If it got better with the ketoconzole then it was probably a fungal infection. If it has returned or not fully resolved then we can try oral antifungal though he should try an extended course of topical antifungals first.  I am not sure what triazolone is. Can we clarify?  HE can follow up with [place] if it is still not improving.

## 2022-03-02 NOTE — Telephone Encounter (Signed)
Patient will keep appt for tomorrow

## 2022-03-02 NOTE — Telephone Encounter (Signed)
Please advise 

## 2022-03-02 NOTE — Telephone Encounter (Signed)
Patient states that following use of the ketoconozole it seemed to irritate things. States that due to this he discontinued use and began using nystatin and triamcinolone acetonide that he had at home. This seemed to provide minor relief before symptoms worsened again. Patient would like to know what the next steps are. States he is only available for OV since he is leaving out of town on 08/02. Please Advise.

## 2022-03-02 NOTE — Telephone Encounter (Signed)
If his rash has improved he does not need to see [place].  Calvin Adams. Jerline Pain, MD 03/02/2022 1:22 PM

## 2022-03-02 NOTE — Telephone Encounter (Signed)
Pt states: -used ketoconazole every day from 07/18 to 07/28 -ceased using both Ketoconazole and Triazolone on 07/30. -Started using Miconazole powder with 2% nitrate on 07/30 and rash has calmed down as of today (07/31)   Pt has MyChart appt 08/01 at 11:40 with Dr. Jerline Pain    Pt requests a call back if appointment can be cancelled     Preferred pharmacy: [id number] [place] [id number] Phone:  [phone] Fax:  [phone] DEA #:  [id number]

## 2022-03-02 NOTE — Telephone Encounter (Signed)
How long did he try the ketoconazole? HE needs to try it for at least 5-7 days consecutively. It may become more irritated at first but that is because it is killing the fungus.   HE needs to come back in for an OV if still not improving.  Algis Greenhouse. Jerline Pain, MD 03/02/2022 10:31 AM

## 2022-03-03 ENCOUNTER — Telehealth (INDEPENDENT_AMBULATORY_CARE_PROVIDER_SITE_OTHER): Payer: Commercial Managed Care - HMO | Admitting: Family Medicine

## 2022-03-03 DIAGNOSIS — R21 Rash and other nonspecific skin eruption: Secondary | ICD-10-CM

## 2022-03-03 MED ORDER — TERBINAFINE HCL 250 MG PO TABS
250.0000 mg | ORAL_TABLET | Freq: Every day | ORAL | 0 refills | Status: DC
Start: 2022-03-03 — End: 2022-03-24

## 2022-03-03 NOTE — Progress Notes (Signed)
   Calvin Adams is a 61 y.o. male who presents today for a virtual office visit.  Assessment/Plan:  Rash Did not have adequate response to topical ketoconazole though it is encouraging that he had a partial response.  Tinea cruris is still on top of the differential at this point though it is possible that it may be resistant to azole antifungals.  We will try a short course of oral terbinafine.  We discussed potential side effects.  He had a recent c-Met with normal LFTs.  He can continue taking the topical miconazole as well.  If he does not have any response to a terbinafine will need to look at different etiologies including lichen planus, psoriasis, eczema, etc. we discussed reasons to return to care.  Follow-up as needed.     Subjective:  HPI:  Patient here for rash follow-up.  He saw him 2 weeks ago for rash.  Concern for tinea infection and he was started on topical ketoconazole.  He did this for 7 to 10 days.  Symptoms improved dramatically however then started noticing worsening rash and irritation.  He then stopped taking the ketoconazole.  He then tried using some leftover triamcinolone and nystatin with some modest improvement but then symptoms started to worsen again.  He then tried some topical miconazole powder which offered some benefit.          Objective/Observations  Physical Exam: Gen: NAD, resting comfortably Pulm: Normal work of breathing Neuro: Grossly normal, moves all extremities Psych: Normal affect and thought content  Virtual Visit via Video   I connected with Calvin Adams on 03/03/22 at 11:40 AM EDT by a video enabled telemedicine application and verified that I am speaking with the correct person using two identifiers. The limitations of evaluation and management by telemedicine and the availability of in person appointments were discussed. The patient expressed understanding and agreed to proceed.   Patient location: Home Provider location: [facility] participating in the virtual visit: Myself and Patient     Algis Greenhouse. Jerline Pain, MD 03/03/2022 12:29 PM

## 2022-03-24 ENCOUNTER — Ambulatory Visit (INDEPENDENT_AMBULATORY_CARE_PROVIDER_SITE_OTHER): Payer: Commercial Managed Care - HMO | Admitting: Family Medicine

## 2022-03-24 ENCOUNTER — Encounter: Payer: Self-pay | Admitting: Family Medicine

## 2022-03-24 VITALS — BP 110/66 | HR 79 | Temp 98.1°F | Ht 69.0 in | Wt 167.2 lb

## 2022-03-24 DIAGNOSIS — R21 Rash and other nonspecific skin eruption: Secondary | ICD-10-CM

## 2022-03-24 MED ORDER — TRIAMCINOLONE ACETONIDE 0.5 % EX OINT: 1.0000 | TOPICAL_OINTMENT | Freq: Two times a day (BID) | CUTANEOUS | 0 refills | Status: DC

## 2022-03-24 NOTE — Progress Notes (Signed)
   Calvin Adams is a 61 y.o. male who presents today for an office visit.  Assessment/Plan:  Rash Has not had much response with either ketoconazole or oral terbinafine.  We will try topical triamcinolone for a few days.  Discussed with patient he should not do this for several weeks in a row.  He will let us know if not improving in the next week or so.  That point would consider having him follow back up with dermatology or skin biopsy.     Subjective:  HPI:  Patient here for rash follow up.  We saw him a month ago for rash.  He was concern for balanitis.  We treated him as a tinea infection with topical ketoconazole.  Symptoms did not improve.  He had been using topical miconazole with some improvement.  We saw him again for a virtual visit about 3 weeks ago.  At that point we started him on terbinafine.  Symptoms improved modestly for about a week but then over the last couple weeks have returned.  Still has a lot of pain.  Some cracking of the skin.       Objective:  Physical Exam: BP 110/66   Pulse 79   Temp 98.1 F (36.7 C) (Temporal)   Ht '5\' 9"'$  (1.753 m)   Wt 167 lb 3.2 oz (75.8 kg)   SpO2 95%   BMI 24.69 kg/m   Gen: No acute distress, resting comfortably Neuro: Grossly normal, moves all extremities Psych: Normal affect and thought content      Jeany Seville M. Jerline Pain, MD 03/24/2022 4:05 PM

## 2022-03-24 NOTE — Patient Instructions (Signed)
It was very nice to see you today!  Please try the triamcinolone.  Please follow up with your dermatologist if not improving.  Let me know if you need [place] to do a biopsy.   Take care, Dr Jerline Pain  PLEASE NOTE:  If you had any lab tests please let us know if you have not heard back within a few days. You may see your results on mychart before we have a chance to review them but we will give you a call once they are reviewed by [place]. If we ordered any referrals today, please let us know if you have not heard from their office within the next week.   Please try these tips to maintain a healthy lifestyle:  Eat at least 3 REAL meals and 1-2 snacks per day.  Aim for no more than 5 hours between eating.  If you eat breakfast, please do so within one hour of getting up.   Each meal should contain half fruits/vegetables, one quarter protein, and one quarter carbs (no bigger than a computer mouse)  Cut down on sweet beverages. This includes juice, soda, and sweet tea.   Drink at least 1 glass of water with each meal and aim for at least 8 glasses per day  Exercise at least 150 minutes every week.

## 2022-08-21 ENCOUNTER — Encounter: Payer: Managed Care, Other (non HMO) | Admitting: Family Medicine

## 2022-11-15 ENCOUNTER — Other Ambulatory Visit: Payer: Self-pay | Admitting: Family Medicine

## 2023-08-24 ENCOUNTER — Ambulatory Visit: Payer: Commercial Managed Care - HMO | Admitting: Family Medicine

## 2023-10-04 ENCOUNTER — Telehealth: Payer: Self-pay

## 2023-10-04 ENCOUNTER — Other Ambulatory Visit (HOSPITAL_COMMUNITY): Payer: Self-pay

## 2023-10-04 NOTE — Telephone Encounter (Signed)
 See below, pt needing PA for Fenofibrated.  Copied from CRM [id number]. Topic: Clinical - Medication Question >> Oct 04, 2023 12:07 PM Corin V wrote: Reason for CRM: Patient has been having issues getting his fenofibrate approved by [place] [facility] due to [facility] not having documentation of previously failed dosages. He is wanting to know if records showing that the lower milligrams he had tried previously did not work. Please send notes to [facility] with Dr. Truett Mainland (P: [phone]) If it can be submitted directly to [employer], send to P: [phone], F: [phone] Please let patient know if he will need to come pick these records up or sign a release. >> Oct 04, 2023 12:18 PM Corin V wrote: Patient would like to know if there are any samples of the 160mg  fenofibrate that can be preovided while he tries to get [facility] to approve the medication for him

## 2023-10-05 NOTE — Telephone Encounter (Signed)
 Can you all set pt up in the right direction to get the appropriate paperwork filled out for these documents or send it to medical records.

## 2023-12-06 ENCOUNTER — Telehealth: Payer: Self-pay | Admitting: *Deleted

## 2023-12-06 DIAGNOSIS — E781 Pure hyperglyceridemia: Secondary | ICD-10-CM

## 2023-12-06 DIAGNOSIS — Z125 Encounter for screening for malignant neoplasm of prostate: Secondary | ICD-10-CM

## 2023-12-06 DIAGNOSIS — Z0000 Encounter for general adult medical examination without abnormal findings: Secondary | ICD-10-CM

## 2023-12-06 NOTE — Addendum Note (Signed)
 Addended by: Arva Lathe on: 12/06/2023 01:27 PM   Modules accepted: Orders

## 2023-12-06 NOTE — Telephone Encounter (Signed)
 Dr. Arlene Ben,  okay to order CPE labs prior to appt.

## 2023-12-06 NOTE — Telephone Encounter (Signed)
 Same labs as 08/20/21 ok with me

## 2023-12-06 NOTE — Telephone Encounter (Signed)
 Copied from CRM [id number]. Topic: Clinical - Request for Lab/Test Order >> Dec 06, 2023  9:43 AM Kita Perish H wrote: Reason for CRM: Patient would like to have his fasting labs done tomorrow prior to his physical appointment on 5/12, please reach out to patient, thanks.  Dara [phone]

## 2023-12-06 NOTE — Telephone Encounter (Signed)
 Future labs have been ordered, ok to schedule lab appt.

## 2023-12-07 ENCOUNTER — Encounter: Payer: Self-pay | Admitting: Family Medicine

## 2023-12-07 ENCOUNTER — Other Ambulatory Visit (INDEPENDENT_AMBULATORY_CARE_PROVIDER_SITE_OTHER)

## 2023-12-07 ENCOUNTER — Other Ambulatory Visit: Payer: Self-pay

## 2023-12-07 DIAGNOSIS — Z0000 Encounter for general adult medical examination without abnormal findings: Secondary | ICD-10-CM

## 2023-12-07 DIAGNOSIS — Z125 Encounter for screening for malignant neoplasm of prostate: Secondary | ICD-10-CM | POA: Diagnosis not present

## 2023-12-07 DIAGNOSIS — E781 Pure hyperglyceridemia: Secondary | ICD-10-CM | POA: Diagnosis not present

## 2023-12-07 DIAGNOSIS — R300 Dysuria: Secondary | ICD-10-CM

## 2023-12-07 LAB — CBC WITH DIFFERENTIAL/PLATELET
Basophils Absolute: 0 10*3/uL (ref 0.0–0.1)
Basophils Relative: 0.9 % (ref 0.0–3.0)
Eosinophils Absolute: 0.2 10*3/uL (ref 0.0–0.7)
Eosinophils Relative: 4.8 % (ref 0.0–5.0)
HCT: 45.4 % (ref 39.0–52.0)
Hemoglobin: 15.3 g/dL (ref 13.0–17.0)
Lymphocytes Relative: 17.9 % (ref 12.0–46.0)
Lymphs Abs: 0.8 10*3/uL (ref 0.7–4.0)
MCHC: 33.7 g/dL (ref 30.0–36.0)
MCV: 92.2 fl (ref 78.0–100.0)
Monocytes Absolute: 0.4 10*3/uL (ref 0.1–1.0)
Monocytes Relative: 9.8 % (ref 3.0–12.0)
Neutro Abs: 2.9 10*3/uL (ref 1.4–7.7)
Neutrophils Relative %: 66.6 % (ref 43.0–77.0)
Platelets: 210 10*3/uL (ref 150.0–400.0)
RBC: 4.93 Mil/uL (ref 4.22–5.81)
RDW: 13 % (ref 11.5–15.5)
WBC: 4.3 10*3/uL (ref 4.0–10.5)

## 2023-12-07 LAB — LIPID PANEL
Cholesterol: 197 mg/dL (ref 0–200)
HDL: 53.9 mg/dL (ref >39.00)
LDL Cholesterol: 124 mg/dL — ABNORMAL HIGH (ref 0–99)
NonHDL: 142.64
Total CHOL/HDL Ratio: 4
Triglycerides: 95 mg/dL (ref 0.0–149.0)
VLDL: 19 mg/dL (ref 0.0–40.0)

## 2023-12-07 LAB — COMPREHENSIVE METABOLIC PANEL WITH GFR
ALT: 21 U/L (ref 0–53)
AST: 16 U/L (ref 0–37)
Albumin: 4.7 g/dL (ref 3.5–5.2)
Alkaline Phosphatase: 49 U/L (ref 39–117)
BUN: 15 mg/dL (ref 6–23)
CO2: 25 meq/L (ref 19–32)
Calcium: 9.1 mg/dL (ref 8.4–10.5)
Chloride: 103 meq/L (ref 96–112)
Creatinine, Ser: 1.04 mg/dL (ref 0.40–1.50)
GFR: 77.73 mL/min (ref >60.00)
Glucose, Bld: 88 mg/dL (ref 70–99)
Potassium: 4.1 meq/L (ref 3.5–5.1)
Sodium: 137 meq/L (ref 135–145)
Total Bilirubin: 0.8 mg/dL (ref 0.2–1.2)
Total Protein: 6.4 g/dL (ref 6.0–8.3)

## 2023-12-07 LAB — PSA: PSA: 3.35 ng/mL (ref 0.10–4.00)

## 2023-12-08 LAB — URINE CULTURE
MICRO NUMBER:: 16419310
Result:: NO GROWTH
SPECIMEN QUALITY:: ADEQUATE

## 2023-12-09 ENCOUNTER — Encounter: Payer: Self-pay | Admitting: Family Medicine

## 2023-12-13 ENCOUNTER — Encounter: Payer: Self-pay | Admitting: Family Medicine

## 2023-12-13 ENCOUNTER — Ambulatory Visit (INDEPENDENT_AMBULATORY_CARE_PROVIDER_SITE_OTHER): Payer: Commercial Managed Care - HMO | Admitting: Family Medicine

## 2023-12-13 VITALS — BP 136/76 | HR 81 | Temp 97.3°F | Ht 69.0 in | Wt 166.8 lb

## 2023-12-13 DIAGNOSIS — R972 Elevated prostate specific antigen [PSA]: Secondary | ICD-10-CM

## 2023-12-13 DIAGNOSIS — Z0000 Encounter for general adult medical examination without abnormal findings: Secondary | ICD-10-CM | POA: Diagnosis not present

## 2023-12-13 NOTE — Progress Notes (Signed)
 Phone: [phone]   Subjective:  Patient presents today for their annual physical. Chief complaint-noted.   See problem oriented charting- ROS- full  review of systems was completed and negative  except for: neck pain issues, cough in morning, still some back pain  The following were reviewed and entered/updated in epic: Past Medical History:  Diagnosis Date   Allergy    Arthritis    Hemorrhoids    Hyperlipidemia    Umbilical hernia    Patient Active Problem List   Diagnosis Date Noted   Testicular hypofunction 07/29/2009    Priority: Medium    Osteoarthritis of multiple joints 04/16/2008    Priority: Medium    Hypertriglyceridemia 05/30/2007    Priority: Medium    Asthma 05/30/2007    Priority: Medium    Erythema nodosum 05/30/2007    Priority: Medium    Post-inflammatory hyperpigmentation 04/23/2016    Priority: Low   Osteoarthritis 06/04/2015    Priority: Low   Polyarticular osteoarthritis 03/24/2013    Priority: Low   Hx of umbilical hernia repair 08/19/2010    Priority: Low   Tendinopathy of right rotator cuff 12/16/2009    Priority: Low   Backache 11/29/2009    Priority: Low   ERECTILE DYSFUNCTION, ORGANIC 04/24/2009    Priority: Low   Genital herpes 05/30/2007    Priority: Low   Allergic rhinitis 05/30/2007    Priority: Low   Cough 12/28/2019   Umbilical pain 03/30/2017   Past Surgical History:  Procedure Laterality Date   APPENDECTOMY     COLONOSCOPY     spermaticeal removal     in [place], [place]   UMBILICAL HERNIA REPAIR  08/04/2011    Family History  Problem Relation Age of Onset   Hypertension Mother    Brain cancer Father        brain tumor, died before patient was born   Colon polyps Sister    Diabetes Sister    Breast cancer Maternal Aunt        several aunts   Colon cancer Neg Hx    Esophageal cancer Neg Hx    Rectal cancer Neg Hx    Stomach cancer Neg Hx    Other Neg Hx        hypogonadism    Medications- reviewed and  updated Current Outpatient Medications  Medication Sig Dispense Refill   celecoxib (CELEBREX) 200 MG capsule Take 200 mg by mouth 2 (two) times daily.     fenofibrate  160 MG tablet Take 1 tablet (160 mg total) by mouth daily. 90 tablet 3   methocarbamol (ROBAXIN) 500 MG tablet Take 500 mg by mouth every 8 (eight) hours as needed. Per [facility]     No current facility-administered medications for this visit.    Allergies-reviewed and updated No Known Allergies  Social History   Social History Narrative   From [place], [place]. Moved [place] in 2002.    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    Lives alone. Single. 2 brothers and a sister    Hobbies: working in yard, gardening, working out   Objective  Objective:  BP 136/76   Pulse 81   Temp (!) 97.3 F (36.3 C)   Ht 5\' 9"  (1.753 m)   Wt 166 lb 12.8 oz (75.7 kg)   SpO2 99%   BMI 24.63 kg/m  Gen: NAD, resting comfortably HEENT: Mucous membranes are moist. Oropharynx normal Neck: no thyromegaly CV: RRR no murmurs rubs or gallops Lungs: CTAB no  crackles, wheeze, rhonchi Abdomen: soft/nontender/nondistended/normal bowel sounds. No rebound or guarding.  Ext: no edema Skin: warm, dry Neuro: grossly normal, moves all extremities, PERRLA Rectal: normal tone, mild diffusely enlarged prostate, no masses or tenderness    Assessment and Plan  63 y.o. male presenting for annual physical.  Health Maintenance counseling: 1. Anticipatory guidance: Patient counseled regarding regular dental exams -q6 months, eye exams -yearly,  avoiding smoking and second hand smoke , limiting alcohol to 2 beverages per day - doesn't drink, no illicit drugs.   2. Risk factor reduction:  Advised patient of need for regular exercise and diet rich and fruits and vegetables to reduce risk of heart attack and stroke.  Exercise- tough with caring for mom and work. Spending 2-3 days a week in [place]  and then trying to cath up when back home.   Diet/weight management-weight stable/healthy over 2 years but feels loss of muscle mass. Encouraged to try to maximize diet Wt Readings from Last 3 Encounters:  12/13/23 166 lb 12.8 oz (75.7 kg)  03/24/22 167 lb 3.2 oz (75.8 kg)  02/17/22 167 lb (75.8 kg)  3. Immunizations/screenings/ancillary studies-outside of Prevnar 20, outside of COVID-19 vaccinations, otherwise up-to-date  Immunization History  Administered Date(s) Administered   PFIZER(Purple Top)SARS-COV-2 Vaccination 10/13/2019, 11/09/2019, 06/25/2020   Td 08/04/2003   Tdap 03/14/2014   Zoster Recombinant(Shingrix) 08/19/2017, 10/23/2017  4. Prostate cancer screening- PSA increased significantly since 2023-offered urology consult and repeat PSA in 1 month- opts in for both. Some BPH on exam- may be the cause Lab Results  Component Value Date   PSA 3.35 12/07/2023   PSA 2.28 08/20/2021   PSA 1.69 05/24/2020   5. Colon cancer screening - colonoscopy 02/28/21 with 5 year repeat planned due to adenomatous and sessile serrated polyps  6. Skin cancer screening- has seen [facility] dermatology Dr. Martina Sledge in past- but no longer in network- has used fluorouracil- cash paid to see Dr. Martina Sledge - but has visit pending with Dr. [facility] 7. Smoking associated screening (lung cancer screening, AAA screen 65-75, UA)- Never smoker  8. STD screening - denies unprotected sex/declines   Status of chronic or acute concerns   #social update- still caring for mom with dementia   # Musculoskeletal - Working with [facility] - Extensive work on his back dating back to mid 2024 including right SI joint injection 02/08/2023 - Also working with them on cervical spine pain-consideration of cervical MBB to RFA- starting with injections first- has to get scheduled- ongoing neck pain - Medications trials: Drowsy on Flexeril -switch to Robaxin but had reflux so stopped, Celebrex 200 mg twice daily as needed - Has declined physical therapy including dry  needling - Weakly positive CCP in the past.  [facility] providers 4-24 mentioned possible rheumatoid arthritis  #hyperlipidemia S: Medication: Fenofibrate  160 mg Lab Results  Component Value Date   CHOL 197 12/07/2023   HDL 53.90 12/07/2023   LDLCALC 124 (H) 12/07/2023   LDLDIRECT 112.0 03/08/2018   TRIG 95.0 12/07/2023   CHOLHDL 4 12/07/2023   A/P: mild elevations but triglyceride(s) very well controlled - had CT chest in 2021 without calcifications even on aorta- discussed holding steady on medications and rechecking CT calcium  scoring next year.    #Chronic Cough from prior notes "seen DR. Ramaswamy and had high resolution CT. Prior long term exposure to bird droppings. Worsened in AM- if cleared morning congestin good for rest of day. Thought was this could be cough neuropathy or irritable larynx syndrome. No  further workup at this time.  - allergies would recommend flonase  for 2 weeks and then do add on therapy with claritin. Also wanted him to try nasal sinus rinse before bed with neti pot or neil med sinus rinse. Could be an allergy element -uses Nn95 with his birds. Dust from food bothered him even if wore his mask -Does have asthma but no recent wheezing.  Discussed having albuterol on hand to see if it helps with cough- doesn't help" -ongoing wost  in the mornings mainly  - not taking anything for allergies at present- may trial Flonase  and claritin combo- also offered referral back to pulmonary - he wants to hold off for now -better than it was in past though  Recommended follow up: Return in about 1 year (around 12/12/2024) for physical or sooner if needed.Schedule b4 you leave.  Lab/Order associations:   ICD-10-CM   1. Preventative health care  Z00.00     2. Elevated PSA  R97.20 PSA    Ambulatory referral to Urology    CANCELED: Ambulatory referral to Urology      No orders of the defined types were placed in this encounter.   Return precautions advised.  Clarisa Crooked, MD

## 2023-12-13 NOTE — Patient Instructions (Addendum)
 Come back at least after 48 hours of no sex/ejaculation or vigorous exercise for repeat PSA- schedule before you leave  We have placed a referral for you today to [facility]- please call their # if you do not hear within a week (may be listed below or you may see mychart message within a few days with #).  Phone: [phone]  Not taking anything for allergies at present- may trial Flonase  and claritin combo- also offered referral back to pulmonary - he wants to hold off for now  Prevnar 20 at pharmacy if cost effective- also see if they will cover Tetanus, Diphtheria, and Pertussis (Tdap) at pharmacy   Recommended follow up: Return in about 1 year (around 12/12/2024) for physical or sooner if needed.Schedule b4 you leave.

## 2023-12-15 ENCOUNTER — Ambulatory Visit: Payer: Self-pay | Admitting: Family Medicine

## 2023-12-15 ENCOUNTER — Other Ambulatory Visit (INDEPENDENT_AMBULATORY_CARE_PROVIDER_SITE_OTHER)

## 2023-12-15 DIAGNOSIS — R972 Elevated prostate specific antigen [PSA]: Secondary | ICD-10-CM

## 2023-12-15 LAB — PSA: PSA: 2.53 ng/mL (ref 0.10–4.00)

## 2023-12-21 ENCOUNTER — Encounter: Payer: Self-pay | Admitting: Family Medicine

## 2024-01-03 ENCOUNTER — Telehealth: Payer: Self-pay

## 2024-01-03 MED ORDER — FENOFIBRATE 145 MG PO TABS: 160.0000 mg | ORAL_TABLET | Freq: Every day | ORAL | 3 refills | Status: AC

## 2024-01-03 NOTE — Telephone Encounter (Signed)
 Copied from CRM [id number]. Topic: Clinical - Medication Question >> Jan 03, 2024  9:06 AM Rosamond Comes wrote: Reason for CRM: patient calling requesting medication refill for fenofibrate  160 MG tablet   insurance will only pay for for the 145 MG tablet Patient has spoken to Dr Arlene Ben about this at the last office visit.   Patient is leaving town on Tuesday morning 01/04/24  Patient pharmacy  [id number] [place], [place] - [address] [place] [address] Phone: [phone] Fax: [phone]   Patient phone [phone] ok to leave detailed message  Please see pt msg, Fenofibrate  only covered for 145 mg Rx. Ok to change Rx dosage and send in for patient?

## 2024-01-03 NOTE — Telephone Encounter (Signed)
 Yes thanks-can change dose and send in for patient

## 2024-01-03 NOTE — Telephone Encounter (Signed)
 Dose has been changed and sent to pharmacy.

## 2024-08-26 ENCOUNTER — Encounter (HOSPITAL_BASED_OUTPATIENT_CLINIC_OR_DEPARTMENT_OTHER): Payer: Self-pay | Admitting: Emergency Medicine

## 2024-08-26 ENCOUNTER — Emergency Department (HOSPITAL_BASED_OUTPATIENT_CLINIC_OR_DEPARTMENT_OTHER)
Admission: EM | Admit: 2024-08-26 | Discharge: 2024-08-26 | Disposition: A | Discharge status: Home / Self Care | Attending: Emergency Medicine | Admitting: Emergency Medicine

## 2024-08-26 ENCOUNTER — Emergency Department (HOSPITAL_BASED_OUTPATIENT_CLINIC_OR_DEPARTMENT_OTHER)

## 2024-08-26 DIAGNOSIS — M542 Cervicalgia: Secondary | ICD-10-CM | POA: Diagnosis not present

## 2024-08-26 DIAGNOSIS — R202 Paresthesia of skin: Secondary | ICD-10-CM | POA: Insufficient documentation

## 2024-08-26 DIAGNOSIS — R200 Anesthesia of skin: Secondary | ICD-10-CM

## 2024-08-26 LAB — CBC WITH DIFFERENTIAL/PLATELET
Abs Immature Granulocytes: 0.02 10*3/uL (ref 0.00–0.07)
Basophils Absolute: 0 10*3/uL (ref 0.0–0.1)
Basophils Relative: 1 %
Eosinophils Absolute: 0.3 10*3/uL (ref 0.0–0.5)
Eosinophils Relative: 5 %
HCT: 42.4 % (ref 39.0–52.0)
Hemoglobin: 14.5 g/dL (ref 13.0–17.0)
Immature Granulocytes: 0 %
Lymphocytes Relative: 24 %
Lymphs Abs: 1.2 10*3/uL (ref 0.7–4.0)
MCH: 31 pg (ref 26.0–34.0)
MCHC: 34.2 g/dL (ref 30.0–36.0)
MCV: 90.6 fL (ref 80.0–100.0)
Monocytes Absolute: 0.5 10*3/uL (ref 0.1–1.0)
Monocytes Relative: 10 %
Neutro Abs: 3.1 10*3/uL (ref 1.7–7.7)
Neutrophils Relative %: 60 %
Platelets: 207 10*3/uL (ref 150–400)
RBC: 4.68 MIL/uL (ref 4.22–5.81)
RDW: 12.3 % (ref 11.5–15.5)
WBC: 5.1 10*3/uL (ref 4.0–10.5)
nRBC: 0 % (ref 0.0–0.2)

## 2024-08-26 LAB — COMPREHENSIVE METABOLIC PANEL WITH GFR
ALT: 25 U/L (ref 0–44)
AST: 20 U/L (ref 15–41)
Albumin: 4.6 g/dL (ref 3.5–5.0)
Alkaline Phosphatase: 50 U/L (ref 38–126)
Anion gap: 12 (ref 5–15)
BUN: 20 mg/dL (ref 8–23)
CO2: 27 mmol/L (ref 22–32)
Calcium: 9.3 mg/dL (ref 8.9–10.3)
Chloride: 104 mmol/L (ref 98–111)
Creatinine, Ser: 0.93 mg/dL (ref 0.61–1.24)
GFR, Estimated: >60 mL/min
Glucose, Bld: 109 mg/dL — ABNORMAL HIGH (ref 70–99)
Potassium: 3.6 mmol/L (ref 3.5–5.1)
Sodium: 142 mmol/L (ref 135–145)
Total Bilirubin: 0.6 mg/dL (ref 0.0–1.2)
Total Protein: 6.3 g/dL — ABNORMAL LOW (ref 6.5–8.1)

## 2024-08-26 LAB — TROPONIN T, HIGH SENSITIVITY: Troponin T High Sensitivity: <6 ng/L (ref 0–19)

## 2024-08-26 MED ORDER — IOHEXOL 350 MG/ML SOLN
75.0000 mL | Freq: Once | INTRAVENOUS | Status: AC | PRN
  Administered 2024-08-26: 75 mL via INTRAVENOUS

## 2024-08-26 NOTE — ED Provider Notes (Signed)
 " [place] EMERGENCY DEPARTMENT AT [facility] Provider Note   CSN: [id number] Arrival date & time: 08/26/24  1614     Patient presents with: face numbness   Calvin Adams is a 63 y.o. male.  {Add pertinent medical, surgical, social history, OB history to HPI:32947} HPI      65 year old male with a history of hypertriglyceridemia presents with concern for left-sided facial numbness.  Reports that he has been taking care of his elderly mother, helping her stand up, wearing a mask to keep her safe.  Last night, he developed a left-sided facial numbness involving the left side of his lower face and left side of his lips.  Denies any other associated symptoms including no headache, other numbness or weakness, change in vision, dizziness, difficulty talking or walking.  He did briefly feel that he had had some problems walking due to the feeling of numbness, but denies any symptoms of aphasia.  The symptoms lasted with more severe severity for about 2 hours.  He took an aspirin.  Reports that he went to bed and woke up he continued to have some sensation of left lip numbness, but the overall symptoms had improved.  At this time, he denies any sensation of numbness but feels not quite right on the left side of his face.  He had some tingling going up to the side of his head but no headache.  No nausea or vomiting.  He had been wearing his mask a lot for the past 3 days.  He denies any history of stroke, diabetes, hypertension or cardiac arrhythmias.  Does have some left sided neck pain, thinks from lifting his mother, not sure if he pulled something.    Past Medical History:  Diagnosis Date   Allergy    Arthritis    Hemorrhoids    Hyperlipidemia    Umbilical hernia     Prior to Admission medications  Medication Sig Start Date End Date Taking? Authorizing Provider  celecoxib (CELEBREX) 200 MG capsule Take 200 mg by mouth 2 (two) times daily.    [provider]   fenofibrate  (TRICOR ) 145 MG tablet Take 1 tablet (145 mg total) by mouth daily. 01/03/24   Katrinka Garnette KIDD, MD    Allergies: Patient has no known allergies.    Review of Systems  Updated Vital Signs BP 139/83   Pulse 65   Temp 97.6 F (36.4 C) (Oral)   Resp 18   SpO2 100%   Physical Exam Vitals and nursing note reviewed.  Constitutional:      General: He is not in acute distress.    Appearance: Normal appearance. He is well-developed. He is not ill-appearing or diaphoretic.  HENT:     Head: Normocephalic and atraumatic.  Eyes:     General: No visual field deficit.    Extraocular Movements: Extraocular movements intact.     Conjunctiva/sclera: Conjunctivae normal.     Pupils: Pupils are equal, round, and reactive to light.  Cardiovascular:     Rate and Rhythm: Normal rate and regular rhythm.     Pulses: Normal pulses.  Pulmonary:     Effort: Pulmonary effort is normal. No respiratory distress.  Musculoskeletal:        General: No swelling or tenderness.     Cervical back: Normal range of motion.  Skin:    General: Skin is warm and dry.     Findings: No erythema or rash.  Neurological:     General: No focal  deficit present.     Mental Status: He is alert and oriented to person, place, and time.     GCS: GCS eye subscore is 4. GCS verbal subscore is 5. GCS motor subscore is 6.     Cranial Nerves: No cranial nerve deficit, dysarthria or facial asymmetry.     Sensory: No sensory deficit (denies current different sensation).     Motor: No weakness or tremor.     Coordination: Coordination normal. Finger-Nose-Finger Test normal.     Gait: Gait normal.     (all labs ordered are listed, but only abnormal results are displayed) Labs Reviewed - No data to display  EKG: None  Radiology: No results found.  {Document cardiac monitor, telemetry assessment procedure when appropriate:32947} Procedures   Medications Ordered in the ED - No data to display    {Click  here for ABCD2, HEART and other calculators REFRESH Note before signing:1}                                64 year old male with a history of hypertriglyceridemia presents with concern for left-sided facial numbness.  DDx includes CVA, TIA, ICH, vertebral dissection, mass, peripheral nerve compression, trigeminal neuralgia, electrolyte abnormality.  Labs completed and personally evaluate interpreted by me show no evidence of anemia, no leukocytosis, no clinically significant electrolyte abnormalities.  EKG was completed and personally eval and interpreted by me show normal sinus rhythm.  CTA head and neck completed showing ***  Discussed possible transfer to [facility] ED for MRI.  His ABCD2 score is 3. There is a concern for dangerous driving conditions tonight due to impending snow/sleet and he wishes to go home. He understands we are unable to rule out stroke without an MRI.  He is not having symptoms at this time.  It is possible symptoms are secondary to nerve compression from mask.  Recommend follow up with PCP/Neurology, consideration of ECHO, recheck hgbA1c, triglycerides, cholesterol.   {Document critical care time when appropriate  Document review of labs and clinical decision tools ie CHADS2VASC2, etc  Document your independent review of radiology images and any outside records  Document your discussion with family members, caretakers and with consultants  Document social determinants of health affecting pt's care  Document your decision making why or why not admission, treatments were needed:32947:::1}   Final diagnoses:  None    ED Discharge Orders     None        "

## 2024-08-26 NOTE — ED Triage Notes (Addendum)
 Face numbness started last night Improved now Provided examined during triage

## 2024-09-04 ENCOUNTER — Inpatient Hospital Stay: Admitting: Family Medicine

## 2024-09-05 ENCOUNTER — Ambulatory Visit: Admitting: Family Medicine

## 2024-09-05 ENCOUNTER — Encounter: Payer: Self-pay | Admitting: Family Medicine

## 2024-09-05 VITALS — BP 94/60 | HR 62 | Temp 97.0°F | Resp 16 | Ht 69.0 in | Wt 171.0 lb

## 2024-09-05 DIAGNOSIS — E663 Overweight: Secondary | ICD-10-CM

## 2024-09-05 DIAGNOSIS — E785 Hyperlipidemia, unspecified: Secondary | ICD-10-CM | POA: Diagnosis not present

## 2024-09-05 DIAGNOSIS — R200 Anesthesia of skin: Secondary | ICD-10-CM | POA: Diagnosis not present

## 2024-09-05 DIAGNOSIS — Z131 Encounter for screening for diabetes mellitus: Secondary | ICD-10-CM

## 2024-09-05 DIAGNOSIS — Z125 Encounter for screening for malignant neoplasm of prostate: Secondary | ICD-10-CM | POA: Diagnosis not present

## 2024-09-11 ENCOUNTER — Other Ambulatory Visit

## 2024-09-20 ENCOUNTER — Ambulatory Visit: Payer: Self-pay | Admitting: Neurology

## 2024-09-25 ENCOUNTER — Other Ambulatory Visit

## 2024-12-18 ENCOUNTER — Encounter: Admitting: Family Medicine
